# Patient Record
Sex: Female | Born: 1935 | Race: Black or African American | Hispanic: Refuse to answer | Smoking: Never smoker
Health system: Southern US, Community
[De-identification: ages and names within clinical notes are randomized; demographics above are authoritative.]

## PROBLEM LIST (undated history)

## (undated) DIAGNOSIS — I10 Essential (primary) hypertension: Secondary | ICD-10-CM

## (undated) DIAGNOSIS — F0280 Dementia in other diseases classified elsewhere without behavioral disturbance: Secondary | ICD-10-CM

## (undated) DIAGNOSIS — M1990 Unspecified osteoarthritis, unspecified site: Secondary | ICD-10-CM

## (undated) DIAGNOSIS — G309 Alzheimer's disease, unspecified: Secondary | ICD-10-CM

## (undated) DIAGNOSIS — D649 Anemia, unspecified: Secondary | ICD-10-CM

## (undated) DIAGNOSIS — T7840XA Allergy, unspecified, initial encounter: Secondary | ICD-10-CM

## (undated) HISTORY — DX: Allergy, unspecified, initial encounter: T78.40XA

## (undated) HISTORY — DX: Unspecified osteoarthritis, unspecified site: M19.90

## (undated) HISTORY — DX: Anemia, unspecified: D64.9

## (undated) HISTORY — DX: Alzheimer's disease, unspecified: G30.9

## (undated) HISTORY — DX: Dementia in other diseases classified elsewhere, unspecified severity, without behavioral disturbance, psychotic disturbance, mood disturbance, and anxiety: F02.80

## (undated) HISTORY — PX: ROTATOR CUFF REPAIR: SHX139

## (undated) HISTORY — PX: ABDOMINAL HYSTERECTOMY: SHX81

## (undated) HISTORY — DX: Essential (primary) hypertension: I10

---

## 2013-03-27 DIAGNOSIS — G3184 Mild cognitive impairment, so stated: Secondary | ICD-10-CM | POA: Diagnosis not present

## 2013-03-27 DIAGNOSIS — I10 Essential (primary) hypertension: Secondary | ICD-10-CM | POA: Diagnosis not present

## 2013-04-16 DIAGNOSIS — M48061 Spinal stenosis, lumbar region without neurogenic claudication: Secondary | ICD-10-CM | POA: Diagnosis not present

## 2013-04-16 DIAGNOSIS — G3184 Mild cognitive impairment, so stated: Secondary | ICD-10-CM | POA: Diagnosis not present

## 2013-04-16 DIAGNOSIS — I10 Essential (primary) hypertension: Secondary | ICD-10-CM | POA: Diagnosis not present

## 2013-04-18 DIAGNOSIS — I10 Essential (primary) hypertension: Secondary | ICD-10-CM | POA: Diagnosis not present

## 2013-04-18 DIAGNOSIS — M48061 Spinal stenosis, lumbar region without neurogenic claudication: Secondary | ICD-10-CM | POA: Diagnosis not present

## 2013-04-18 DIAGNOSIS — G3184 Mild cognitive impairment, so stated: Secondary | ICD-10-CM | POA: Diagnosis not present

## 2013-04-21 DIAGNOSIS — I10 Essential (primary) hypertension: Secondary | ICD-10-CM | POA: Diagnosis not present

## 2013-04-21 DIAGNOSIS — G3184 Mild cognitive impairment, so stated: Secondary | ICD-10-CM | POA: Diagnosis not present

## 2013-04-21 DIAGNOSIS — M48061 Spinal stenosis, lumbar region without neurogenic claudication: Secondary | ICD-10-CM | POA: Diagnosis not present

## 2013-04-22 DIAGNOSIS — I10 Essential (primary) hypertension: Secondary | ICD-10-CM | POA: Diagnosis not present

## 2013-04-22 DIAGNOSIS — M48061 Spinal stenosis, lumbar region without neurogenic claudication: Secondary | ICD-10-CM | POA: Diagnosis not present

## 2013-04-22 DIAGNOSIS — G3184 Mild cognitive impairment, so stated: Secondary | ICD-10-CM | POA: Diagnosis not present

## 2013-04-29 DIAGNOSIS — M48061 Spinal stenosis, lumbar region without neurogenic claudication: Secondary | ICD-10-CM | POA: Diagnosis not present

## 2013-04-29 DIAGNOSIS — G3184 Mild cognitive impairment, so stated: Secondary | ICD-10-CM | POA: Diagnosis not present

## 2013-04-29 DIAGNOSIS — I10 Essential (primary) hypertension: Secondary | ICD-10-CM | POA: Diagnosis not present

## 2013-04-30 DIAGNOSIS — G3184 Mild cognitive impairment, so stated: Secondary | ICD-10-CM | POA: Diagnosis not present

## 2013-04-30 DIAGNOSIS — I10 Essential (primary) hypertension: Secondary | ICD-10-CM | POA: Diagnosis not present

## 2013-04-30 DIAGNOSIS — M48061 Spinal stenosis, lumbar region without neurogenic claudication: Secondary | ICD-10-CM | POA: Diagnosis not present

## 2013-05-01 DIAGNOSIS — M48061 Spinal stenosis, lumbar region without neurogenic claudication: Secondary | ICD-10-CM | POA: Diagnosis not present

## 2013-05-01 DIAGNOSIS — G3184 Mild cognitive impairment, so stated: Secondary | ICD-10-CM | POA: Diagnosis not present

## 2013-05-01 DIAGNOSIS — I10 Essential (primary) hypertension: Secondary | ICD-10-CM | POA: Diagnosis not present

## 2013-05-06 DIAGNOSIS — G3184 Mild cognitive impairment, so stated: Secondary | ICD-10-CM | POA: Diagnosis not present

## 2013-05-06 DIAGNOSIS — M48061 Spinal stenosis, lumbar region without neurogenic claudication: Secondary | ICD-10-CM | POA: Diagnosis not present

## 2013-05-06 DIAGNOSIS — I10 Essential (primary) hypertension: Secondary | ICD-10-CM | POA: Diagnosis not present

## 2013-05-08 DIAGNOSIS — M48061 Spinal stenosis, lumbar region without neurogenic claudication: Secondary | ICD-10-CM | POA: Diagnosis not present

## 2013-05-08 DIAGNOSIS — I10 Essential (primary) hypertension: Secondary | ICD-10-CM | POA: Diagnosis not present

## 2013-05-08 DIAGNOSIS — G3184 Mild cognitive impairment, so stated: Secondary | ICD-10-CM | POA: Diagnosis not present

## 2013-05-12 DIAGNOSIS — M48061 Spinal stenosis, lumbar region without neurogenic claudication: Secondary | ICD-10-CM | POA: Diagnosis not present

## 2013-05-12 DIAGNOSIS — G3184 Mild cognitive impairment, so stated: Secondary | ICD-10-CM | POA: Diagnosis not present

## 2013-05-12 DIAGNOSIS — I10 Essential (primary) hypertension: Secondary | ICD-10-CM | POA: Diagnosis not present

## 2013-05-16 DIAGNOSIS — M48061 Spinal stenosis, lumbar region without neurogenic claudication: Secondary | ICD-10-CM | POA: Diagnosis not present

## 2013-05-16 DIAGNOSIS — I10 Essential (primary) hypertension: Secondary | ICD-10-CM | POA: Diagnosis not present

## 2013-05-16 DIAGNOSIS — G3184 Mild cognitive impairment, so stated: Secondary | ICD-10-CM | POA: Diagnosis not present

## 2013-05-20 DIAGNOSIS — G3184 Mild cognitive impairment, so stated: Secondary | ICD-10-CM | POA: Diagnosis not present

## 2013-05-20 DIAGNOSIS — M48061 Spinal stenosis, lumbar region without neurogenic claudication: Secondary | ICD-10-CM | POA: Diagnosis not present

## 2013-05-20 DIAGNOSIS — I10 Essential (primary) hypertension: Secondary | ICD-10-CM | POA: Diagnosis not present

## 2013-06-02 DIAGNOSIS — G3184 Mild cognitive impairment, so stated: Secondary | ICD-10-CM | POA: Diagnosis not present

## 2013-06-02 DIAGNOSIS — I10 Essential (primary) hypertension: Secondary | ICD-10-CM | POA: Diagnosis not present

## 2013-06-02 DIAGNOSIS — M48061 Spinal stenosis, lumbar region without neurogenic claudication: Secondary | ICD-10-CM | POA: Diagnosis not present

## 2013-06-04 DIAGNOSIS — M48061 Spinal stenosis, lumbar region without neurogenic claudication: Secondary | ICD-10-CM | POA: Diagnosis not present

## 2013-06-04 DIAGNOSIS — I10 Essential (primary) hypertension: Secondary | ICD-10-CM | POA: Diagnosis not present

## 2013-06-04 DIAGNOSIS — G3184 Mild cognitive impairment, so stated: Secondary | ICD-10-CM | POA: Diagnosis not present

## 2013-06-06 DIAGNOSIS — G3184 Mild cognitive impairment, so stated: Secondary | ICD-10-CM | POA: Diagnosis not present

## 2013-06-06 DIAGNOSIS — M48061 Spinal stenosis, lumbar region without neurogenic claudication: Secondary | ICD-10-CM | POA: Diagnosis not present

## 2013-06-06 DIAGNOSIS — I10 Essential (primary) hypertension: Secondary | ICD-10-CM | POA: Diagnosis not present

## 2013-06-09 DIAGNOSIS — M48061 Spinal stenosis, lumbar region without neurogenic claudication: Secondary | ICD-10-CM | POA: Diagnosis not present

## 2013-06-09 DIAGNOSIS — I10 Essential (primary) hypertension: Secondary | ICD-10-CM | POA: Diagnosis not present

## 2013-06-09 DIAGNOSIS — G3184 Mild cognitive impairment, so stated: Secondary | ICD-10-CM | POA: Diagnosis not present

## 2013-06-11 DIAGNOSIS — M48061 Spinal stenosis, lumbar region without neurogenic claudication: Secondary | ICD-10-CM | POA: Diagnosis not present

## 2013-06-11 DIAGNOSIS — I10 Essential (primary) hypertension: Secondary | ICD-10-CM | POA: Diagnosis not present

## 2013-06-11 DIAGNOSIS — G3184 Mild cognitive impairment, so stated: Secondary | ICD-10-CM | POA: Diagnosis not present

## 2013-06-13 DIAGNOSIS — M48061 Spinal stenosis, lumbar region without neurogenic claudication: Secondary | ICD-10-CM | POA: Diagnosis not present

## 2013-06-13 DIAGNOSIS — G3184 Mild cognitive impairment, so stated: Secondary | ICD-10-CM | POA: Diagnosis not present

## 2013-06-13 DIAGNOSIS — I10 Essential (primary) hypertension: Secondary | ICD-10-CM | POA: Diagnosis not present

## 2013-09-16 DIAGNOSIS — M25559 Pain in unspecified hip: Secondary | ICD-10-CM | POA: Diagnosis not present

## 2013-09-16 DIAGNOSIS — I10 Essential (primary) hypertension: Secondary | ICD-10-CM | POA: Diagnosis not present

## 2013-09-16 DIAGNOSIS — G3184 Mild cognitive impairment, so stated: Secondary | ICD-10-CM | POA: Diagnosis not present

## 2013-11-01 DIAGNOSIS — M2550 Pain in unspecified joint: Secondary | ICD-10-CM | POA: Diagnosis not present

## 2014-02-03 DIAGNOSIS — Z23 Encounter for immunization: Secondary | ICD-10-CM | POA: Diagnosis not present

## 2014-03-12 DIAGNOSIS — I10 Essential (primary) hypertension: Secondary | ICD-10-CM | POA: Diagnosis not present

## 2014-03-12 DIAGNOSIS — G3184 Mild cognitive impairment, so stated: Secondary | ICD-10-CM | POA: Diagnosis not present

## 2014-07-02 DIAGNOSIS — Z01411 Encounter for gynecological examination (general) (routine) with abnormal findings: Secondary | ICD-10-CM | POA: Diagnosis not present

## 2014-07-02 DIAGNOSIS — F329 Major depressive disorder, single episode, unspecified: Secondary | ICD-10-CM | POA: Diagnosis not present

## 2014-07-02 DIAGNOSIS — Z87891 Personal history of nicotine dependence: Secondary | ICD-10-CM | POA: Diagnosis not present

## 2014-07-02 DIAGNOSIS — G309 Alzheimer's disease, unspecified: Secondary | ICD-10-CM | POA: Diagnosis not present

## 2014-07-28 DIAGNOSIS — M8580 Other specified disorders of bone density and structure, unspecified site: Secondary | ICD-10-CM | POA: Diagnosis not present

## 2014-07-28 DIAGNOSIS — G3184 Mild cognitive impairment, so stated: Secondary | ICD-10-CM | POA: Diagnosis not present

## 2014-07-28 DIAGNOSIS — M7989 Other specified soft tissue disorders: Secondary | ICD-10-CM | POA: Diagnosis not present

## 2014-07-28 DIAGNOSIS — M899 Disorder of bone, unspecified: Secondary | ICD-10-CM | POA: Diagnosis not present

## 2014-07-28 DIAGNOSIS — I10 Essential (primary) hypertension: Secondary | ICD-10-CM | POA: Diagnosis not present

## 2014-07-29 DIAGNOSIS — D649 Anemia, unspecified: Secondary | ICD-10-CM | POA: Diagnosis not present

## 2014-10-06 DIAGNOSIS — I10 Essential (primary) hypertension: Secondary | ICD-10-CM | POA: Diagnosis not present

## 2014-10-06 DIAGNOSIS — G3184 Mild cognitive impairment, so stated: Secondary | ICD-10-CM | POA: Diagnosis not present

## 2014-10-16 ENCOUNTER — Ambulatory Visit (INDEPENDENT_AMBULATORY_CARE_PROVIDER_SITE_OTHER): Payer: Medicare Other | Admitting: Family Medicine

## 2014-10-16 ENCOUNTER — Ambulatory Visit (INDEPENDENT_AMBULATORY_CARE_PROVIDER_SITE_OTHER): Payer: Medicare Other

## 2014-10-16 VITALS — BP 130/72 | HR 76 | Temp 97.8°F | Resp 14 | Ht 63.0 in | Wt 155.2 lb

## 2014-10-16 DIAGNOSIS — M25552 Pain in left hip: Secondary | ICD-10-CM

## 2014-10-16 DIAGNOSIS — M25512 Pain in left shoulder: Secondary | ICD-10-CM

## 2014-10-16 DIAGNOSIS — Z7689 Persons encountering health services in other specified circumstances: Secondary | ICD-10-CM

## 2014-10-16 DIAGNOSIS — T148XXA Other injury of unspecified body region, initial encounter: Secondary | ICD-10-CM

## 2014-10-16 DIAGNOSIS — T148 Other injury of unspecified body region: Secondary | ICD-10-CM

## 2014-10-16 DIAGNOSIS — Z7189 Other specified counseling: Secondary | ICD-10-CM

## 2014-10-16 NOTE — Progress Notes (Signed)
 Chief Complaint:  Chief Complaint  Patient presents with  . Fall  . Shoulder Pain    left shoulder  . memory issues    HPI: Tara Dixon is a 81 y.o. female who reports to [facility] today complaining of left shoulder pain due to fall and gait instability, diagnosed with Alzheimer's which is in early stages. She also has OA and has been stumbling, this is not new for her. She uses a walker but still has some gait instability. Last night she fell while opening the refirdgerator and lost her balnace. Did not hit her head, no LOC. She has fallen twice, in the first time was last month, she fell down a couple of steps. She can't move her left arm very well above waist height. Denies n/w/t that is new. Deneis confusion, cp , dizziness, urianry sxs, sob or palpitations.   Past Medical History  Diagnosis Date  . Alzheimer disease   . Allergy   . Anemia   . Arthritis   . Hypertension    Past Surgical History  Procedure Laterality Date  . Abdominal hysterectomy    . Rotator cuff repair Right    Social History   Social History  . Marital Status: Widowed    Spouse Name: N/A  . Number of Children: N/A  . Years of Education: N/A   Social History Main Topics  . Smoking status: Never Smoker   . Smokeless tobacco: None  . Alcohol Use: None  . Drug Use: None  . Sexual Activity: Not Asked   Other Topics Concern  . None   Social History Narrative  . None   Family History  Problem Relation Age of Onset  . Hypertension Mother   . Stroke Mother   . Hypertension Father   . Stroke Father   . Hypertension Sister    Allergies  Allergen Reactions  . Ampicillin   . Bactrim [Sulfamethoxazole-Trimethoprim]   . Celebrex [Celecoxib]   . Polysporin [Bacitracin-Polymyxin B]    Prior to Admission medications   Medication Sig Start Date End Date Taking? Authorizing Provider  amLODipine (NORVASC) 5 MG tablet Take 5 mg by mouth daily.   Yes Historical Provider, MD  Calcium  Carb-Cholecalciferol (CALCIUM + D3 PO) Take by mouth.   Yes Historical Provider, MD  hydrochlorothiazide (MICROZIDE) 12.5 MG capsule Take 12.5 mg by mouth daily.   Yes Historical Provider, MD  MEMANTINE HCL PO Take by mouth.   Yes Historical Provider, MD  Multiple Vitamins-Minerals (CENTRUM ADULTS PO) Take by mouth.   Yes Historical Provider, MD  Omega-3 Fatty Acids (OMEGA 3 PO) Take by mouth.   Yes Historical Provider, MD     ROS: The patient denies fevers, chills, night sweats, unintentional weight loss, chest pain, palpitations, wheezing, dyspnea on exertion, nausea, vomiting, abdominal pain, dysuria, hematuria, melena, numbness, weakness, or tingling.   All other systems have been reviewed and were otherwise negative with the exception of those mentioned in the HPI and as above.    PHYSICAL EXAM: Filed Vitals:   10/16/14 1638  BP: 130/72  Pulse: 76  Temp: 97.8 F (36.6 C)  Resp: 14   Body mass index is 27.5 kg/(m^2).   General: Alert, no acute distress HEENT:  Normocephalic, atraumatic, oropharynx patent. EOMI, PERRLA Cardiovascular:  Regular rate and rhythm, no rubs murmurs or gallops.  No Carotid bruits, radial pulse intact. No pedal edema.  Respiratory: Clear to auscultation bilaterally.  No wheezes, rales, or rhonchi.  No cyanosis,  no use of accessory musculature Abdominal: No organomegaly, abdomen is soft and non-tender, positive bowel sounds. No masses. Skin: No rashes. Neurologic: Facial musculature symmetric. Psychiatric: Patient acts appropriately throughout our interaction. Lymphatic: No cervical or submandibular lymphadenopathy Musculoskeletal: Gait using walker. No edema, tenderness Neck exam normal-neg spurling Shoulder No deformity, no hypertrophy/atrophy, no erythema, no fluid, no wounds Decrease ROm, lift to flexion about 25 degrees, cannot do abbduction or adduction without pain. Good 5/5 grip strength Nontender at [facility] Unable to illicit Empty Can test,  Lift off test, Speeds, Hawkins/Neers due to pain 5/5 strength, 2/2 triceps and biceps DTRs ft hip-normal ER and IR       LABS: No results found for this or any previous visit.   EKG/XRAY:   Primary read interpreted by Dr. Marin Comment at [facility] Negative hip and shoulder, + DJD   ASSESSMENT/PLAN: Encounter Diagnoses  Name Primary?  . Hip pain, left Yes  . Pain in joint, shoulder region, left   . Sprain and strain    Refer to [facility] adult medicine Tylenol and ibuprofen Refer you to ortho   Gross sideeffects, risk and benefits, and alternatives of medications d/w patient. Patient is aware that all medications have potential sideeffects and we are unable to predict every sideeffect or drug-drug interaction that may occur.    DO  10/16/2014 6:09 PM

## 2014-11-20 ENCOUNTER — Ambulatory Visit: Payer: Medicare Other | Admitting: Internal Medicine

## 2014-12-14 DIAGNOSIS — M170 Bilateral primary osteoarthritis of knee: Secondary | ICD-10-CM | POA: Diagnosis not present

## 2015-01-12 DIAGNOSIS — G3184 Mild cognitive impairment, so stated: Secondary | ICD-10-CM | POA: Diagnosis not present

## 2015-01-12 DIAGNOSIS — D649 Anemia, unspecified: Secondary | ICD-10-CM | POA: Diagnosis not present

## 2015-01-12 DIAGNOSIS — Z23 Encounter for immunization: Secondary | ICD-10-CM | POA: Diagnosis not present

## 2015-01-18 DIAGNOSIS — Z880 Allergy status to penicillin: Secondary | ICD-10-CM | POA: Diagnosis not present

## 2015-01-18 DIAGNOSIS — G301 Alzheimer's disease with late onset: Secondary | ICD-10-CM | POA: Diagnosis not present

## 2015-01-18 DIAGNOSIS — Z882 Allergy status to sulfonamides status: Secondary | ICD-10-CM | POA: Diagnosis not present

## 2015-01-18 DIAGNOSIS — F0280 Dementia in other diseases classified elsewhere without behavioral disturbance: Secondary | ICD-10-CM | POA: Diagnosis not present

## 2015-01-18 DIAGNOSIS — G300 Alzheimer's disease with early onset: Secondary | ICD-10-CM | POA: Diagnosis not present

## 2015-01-20 ENCOUNTER — Ambulatory Visit: Payer: Self-pay | Admitting: Internal Medicine

## 2015-01-22 ENCOUNTER — Encounter: Payer: Self-pay | Admitting: Internal Medicine

## 2015-03-02 DIAGNOSIS — M1990 Unspecified osteoarthritis, unspecified site: Secondary | ICD-10-CM | POA: Diagnosis not present

## 2015-03-02 DIAGNOSIS — I10 Essential (primary) hypertension: Secondary | ICD-10-CM | POA: Diagnosis not present

## 2015-03-02 DIAGNOSIS — G301 Alzheimer's disease with late onset: Secondary | ICD-10-CM | POA: Diagnosis not present

## 2015-03-02 DIAGNOSIS — R600 Localized edema: Secondary | ICD-10-CM | POA: Diagnosis not present

## 2015-03-04 ENCOUNTER — Other Ambulatory Visit (HOSPITAL_COMMUNITY): Payer: Self-pay | Admitting: Internal Medicine

## 2015-03-04 DIAGNOSIS — R600 Localized edema: Secondary | ICD-10-CM

## 2015-03-11 ENCOUNTER — Ambulatory Visit (HOSPITAL_COMMUNITY): Payer: Medicare Other | Attending: Cardiovascular Disease

## 2015-03-11 ENCOUNTER — Other Ambulatory Visit: Payer: Self-pay

## 2015-03-11 DIAGNOSIS — I313 Pericardial effusion (noninflammatory): Secondary | ICD-10-CM | POA: Diagnosis not present

## 2015-03-11 DIAGNOSIS — I517 Cardiomegaly: Secondary | ICD-10-CM | POA: Diagnosis not present

## 2015-03-11 DIAGNOSIS — I253 Aneurysm of heart: Secondary | ICD-10-CM | POA: Insufficient documentation

## 2015-03-11 DIAGNOSIS — R600 Localized edema: Secondary | ICD-10-CM

## 2015-05-04 DIAGNOSIS — G301 Alzheimer's disease with late onset: Secondary | ICD-10-CM | POA: Diagnosis not present

## 2015-05-04 DIAGNOSIS — Z23 Encounter for immunization: Secondary | ICD-10-CM | POA: Diagnosis not present

## 2015-05-04 DIAGNOSIS — K5790 Diverticulosis of intestine, part unspecified, without perforation or abscess without bleeding: Secondary | ICD-10-CM | POA: Diagnosis not present

## 2015-05-04 DIAGNOSIS — E7800 Pure hypercholesterolemia, unspecified: Secondary | ICD-10-CM | POA: Diagnosis not present

## 2015-05-04 DIAGNOSIS — M1990 Unspecified osteoarthritis, unspecified site: Secondary | ICD-10-CM | POA: Diagnosis not present

## 2015-05-04 DIAGNOSIS — Z0000 Encounter for general adult medical examination without abnormal findings: Secondary | ICD-10-CM | POA: Diagnosis not present

## 2015-05-04 DIAGNOSIS — I10 Essential (primary) hypertension: Secondary | ICD-10-CM | POA: Diagnosis not present

## 2015-05-04 DIAGNOSIS — I5033 Acute on chronic diastolic (congestive) heart failure: Secondary | ICD-10-CM | POA: Diagnosis not present

## 2015-05-04 DIAGNOSIS — Z1389 Encounter for screening for other disorder: Secondary | ICD-10-CM | POA: Diagnosis not present

## 2015-05-04 DIAGNOSIS — K5900 Constipation, unspecified: Secondary | ICD-10-CM | POA: Diagnosis not present

## 2015-05-18 DIAGNOSIS — Z0100 Encounter for examination of eyes and vision without abnormal findings: Secondary | ICD-10-CM | POA: Diagnosis not present

## 2015-05-18 DIAGNOSIS — H2513 Age-related nuclear cataract, bilateral: Secondary | ICD-10-CM | POA: Diagnosis not present

## 2015-06-09 DIAGNOSIS — D649 Anemia, unspecified: Secondary | ICD-10-CM | POA: Diagnosis not present

## 2015-07-15 DIAGNOSIS — F0280 Dementia in other diseases classified elsewhere without behavioral disturbance: Secondary | ICD-10-CM | POA: Diagnosis not present

## 2015-07-15 DIAGNOSIS — G301 Alzheimer's disease with late onset: Secondary | ICD-10-CM | POA: Diagnosis not present

## 2015-09-07 DIAGNOSIS — G301 Alzheimer's disease with late onset: Secondary | ICD-10-CM | POA: Diagnosis not present

## 2015-09-07 DIAGNOSIS — I10 Essential (primary) hypertension: Secondary | ICD-10-CM | POA: Diagnosis not present

## 2015-09-07 DIAGNOSIS — D649 Anemia, unspecified: Secondary | ICD-10-CM | POA: Diagnosis not present

## 2015-09-07 DIAGNOSIS — M1990 Unspecified osteoarthritis, unspecified site: Secondary | ICD-10-CM | POA: Diagnosis not present

## 2015-09-07 DIAGNOSIS — I5033 Acute on chronic diastolic (congestive) heart failure: Secondary | ICD-10-CM | POA: Diagnosis not present

## 2015-09-07 DIAGNOSIS — Z23 Encounter for immunization: Secondary | ICD-10-CM | POA: Diagnosis not present

## 2015-11-10 DIAGNOSIS — B078 Other viral warts: Secondary | ICD-10-CM | POA: Diagnosis not present

## 2015-11-10 DIAGNOSIS — B079 Viral wart, unspecified: Secondary | ICD-10-CM | POA: Diagnosis not present

## 2015-11-10 DIAGNOSIS — L570 Actinic keratosis: Secondary | ICD-10-CM | POA: Diagnosis not present

## 2015-11-29 DIAGNOSIS — Z23 Encounter for immunization: Secondary | ICD-10-CM | POA: Diagnosis not present

## 2016-02-29 DIAGNOSIS — D649 Anemia, unspecified: Secondary | ICD-10-CM | POA: Diagnosis not present

## 2016-03-06 DIAGNOSIS — D509 Iron deficiency anemia, unspecified: Secondary | ICD-10-CM | POA: Diagnosis not present

## 2016-03-15 DIAGNOSIS — M1990 Unspecified osteoarthritis, unspecified site: Secondary | ICD-10-CM | POA: Diagnosis not present

## 2016-03-15 DIAGNOSIS — D563 Thalassemia minor: Secondary | ICD-10-CM | POA: Diagnosis not present

## 2016-03-15 DIAGNOSIS — D509 Iron deficiency anemia, unspecified: Secondary | ICD-10-CM | POA: Diagnosis not present

## 2016-03-15 DIAGNOSIS — G301 Alzheimer's disease with late onset: Secondary | ICD-10-CM | POA: Diagnosis not present

## 2016-03-15 DIAGNOSIS — I5033 Acute on chronic diastolic (congestive) heart failure: Secondary | ICD-10-CM | POA: Diagnosis not present

## 2016-03-15 DIAGNOSIS — I10 Essential (primary) hypertension: Secondary | ICD-10-CM | POA: Diagnosis not present

## 2016-03-15 DIAGNOSIS — E7800 Pure hypercholesterolemia, unspecified: Secondary | ICD-10-CM | POA: Diagnosis not present

## 2016-05-17 DIAGNOSIS — D2239 Melanocytic nevi of other parts of face: Secondary | ICD-10-CM | POA: Diagnosis not present

## 2016-05-17 DIAGNOSIS — L821 Other seborrheic keratosis: Secondary | ICD-10-CM | POA: Diagnosis not present

## 2016-07-05 DIAGNOSIS — G4710 Hypersomnia, unspecified: Secondary | ICD-10-CM | POA: Diagnosis not present

## 2016-07-05 DIAGNOSIS — I10 Essential (primary) hypertension: Secondary | ICD-10-CM | POA: Diagnosis not present

## 2016-07-05 DIAGNOSIS — G301 Alzheimer's disease with late onset: Secondary | ICD-10-CM | POA: Diagnosis not present

## 2016-07-05 DIAGNOSIS — D509 Iron deficiency anemia, unspecified: Secondary | ICD-10-CM | POA: Diagnosis not present

## 2016-07-22 IMAGING — CR DG HIP (WITH OR WITHOUT PELVIS) 2-3V*L*
3 series · 3 of 3 positions shown · non-contrast
Comparison: None.

CLINICAL DATA: LEFT hip pain, fall

EXAM:
DG HIP (WITH OR WITHOUT PELVIS) 2-3V LEFT

[AP (1 of 2)]
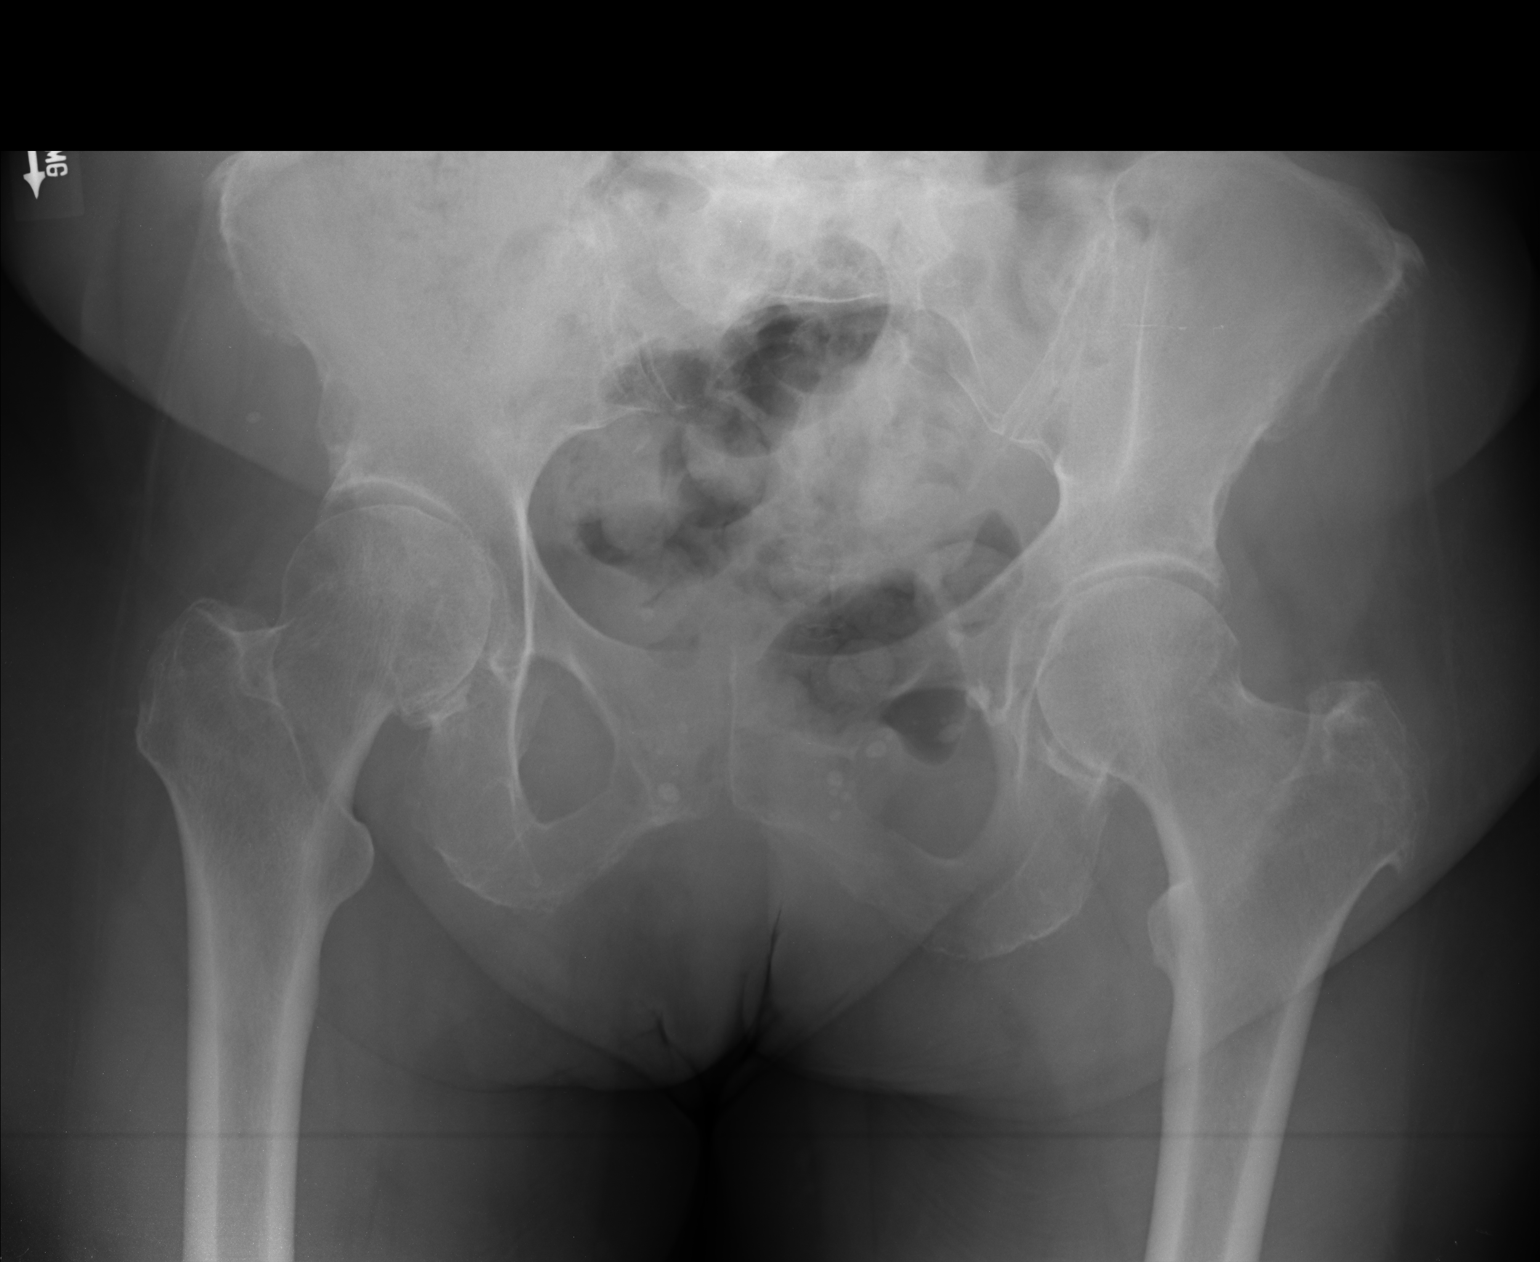

[AP (2 of 2)]
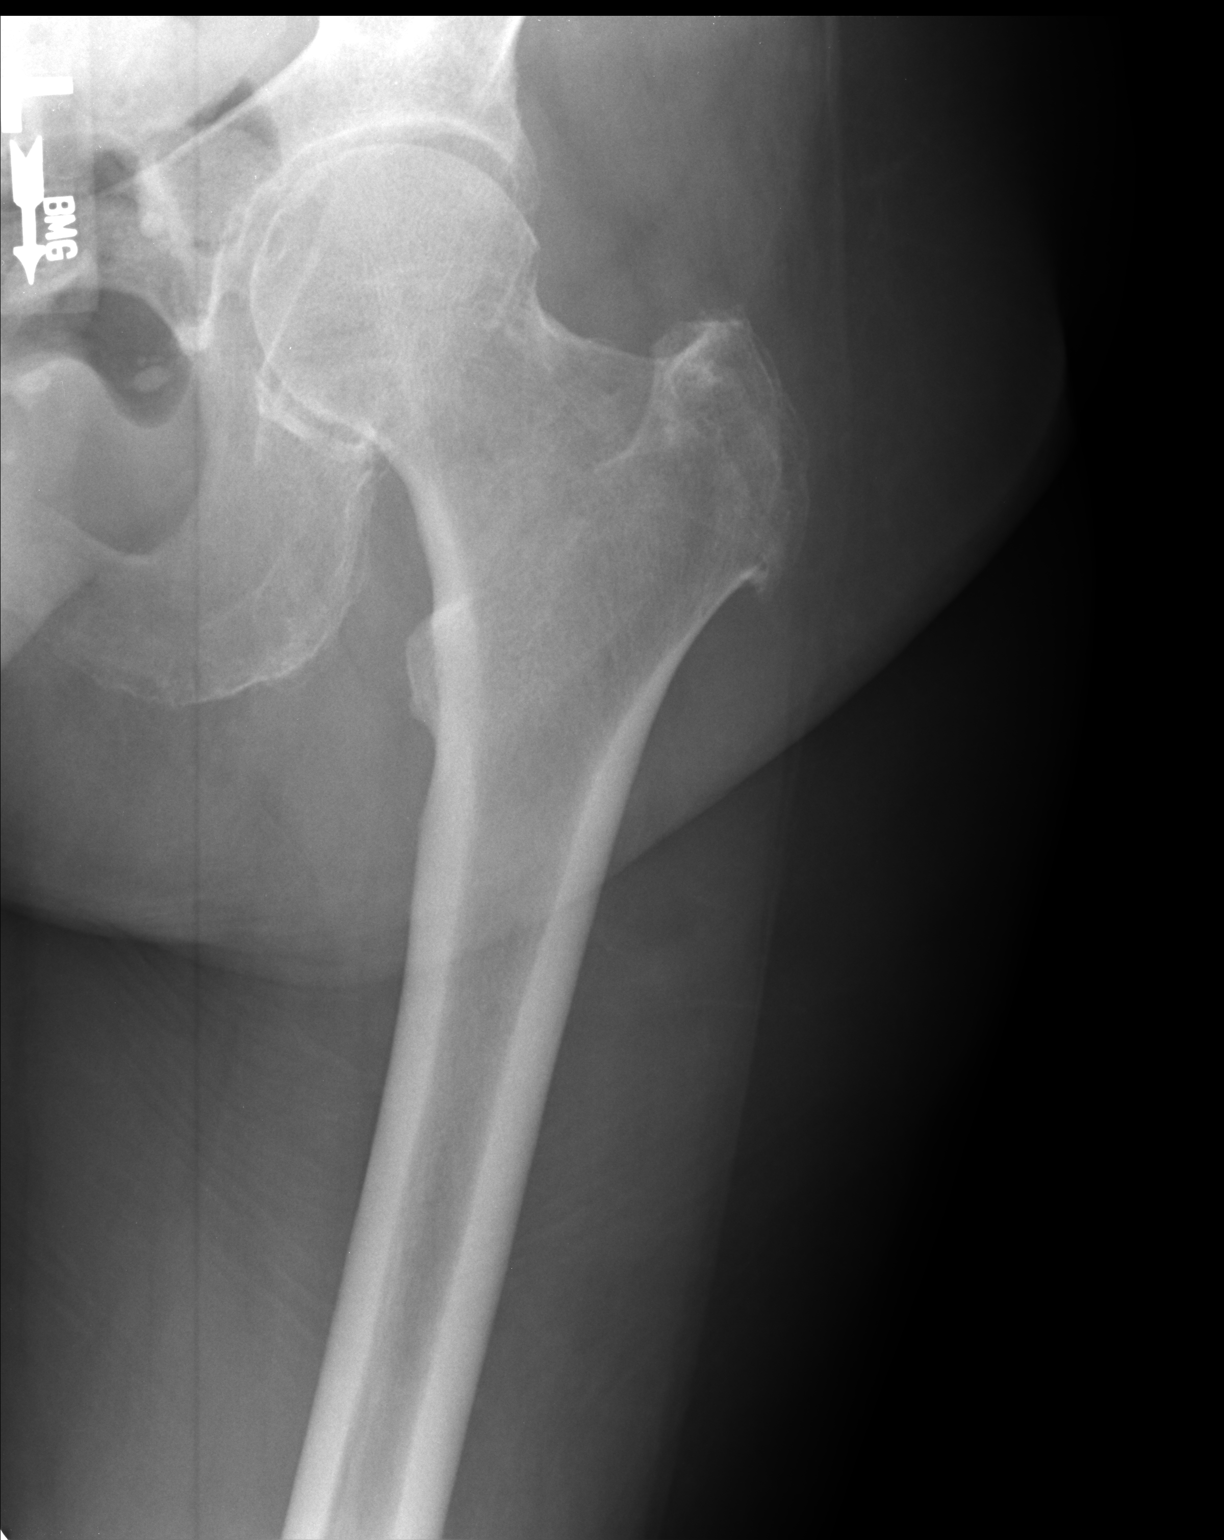

[lateral]
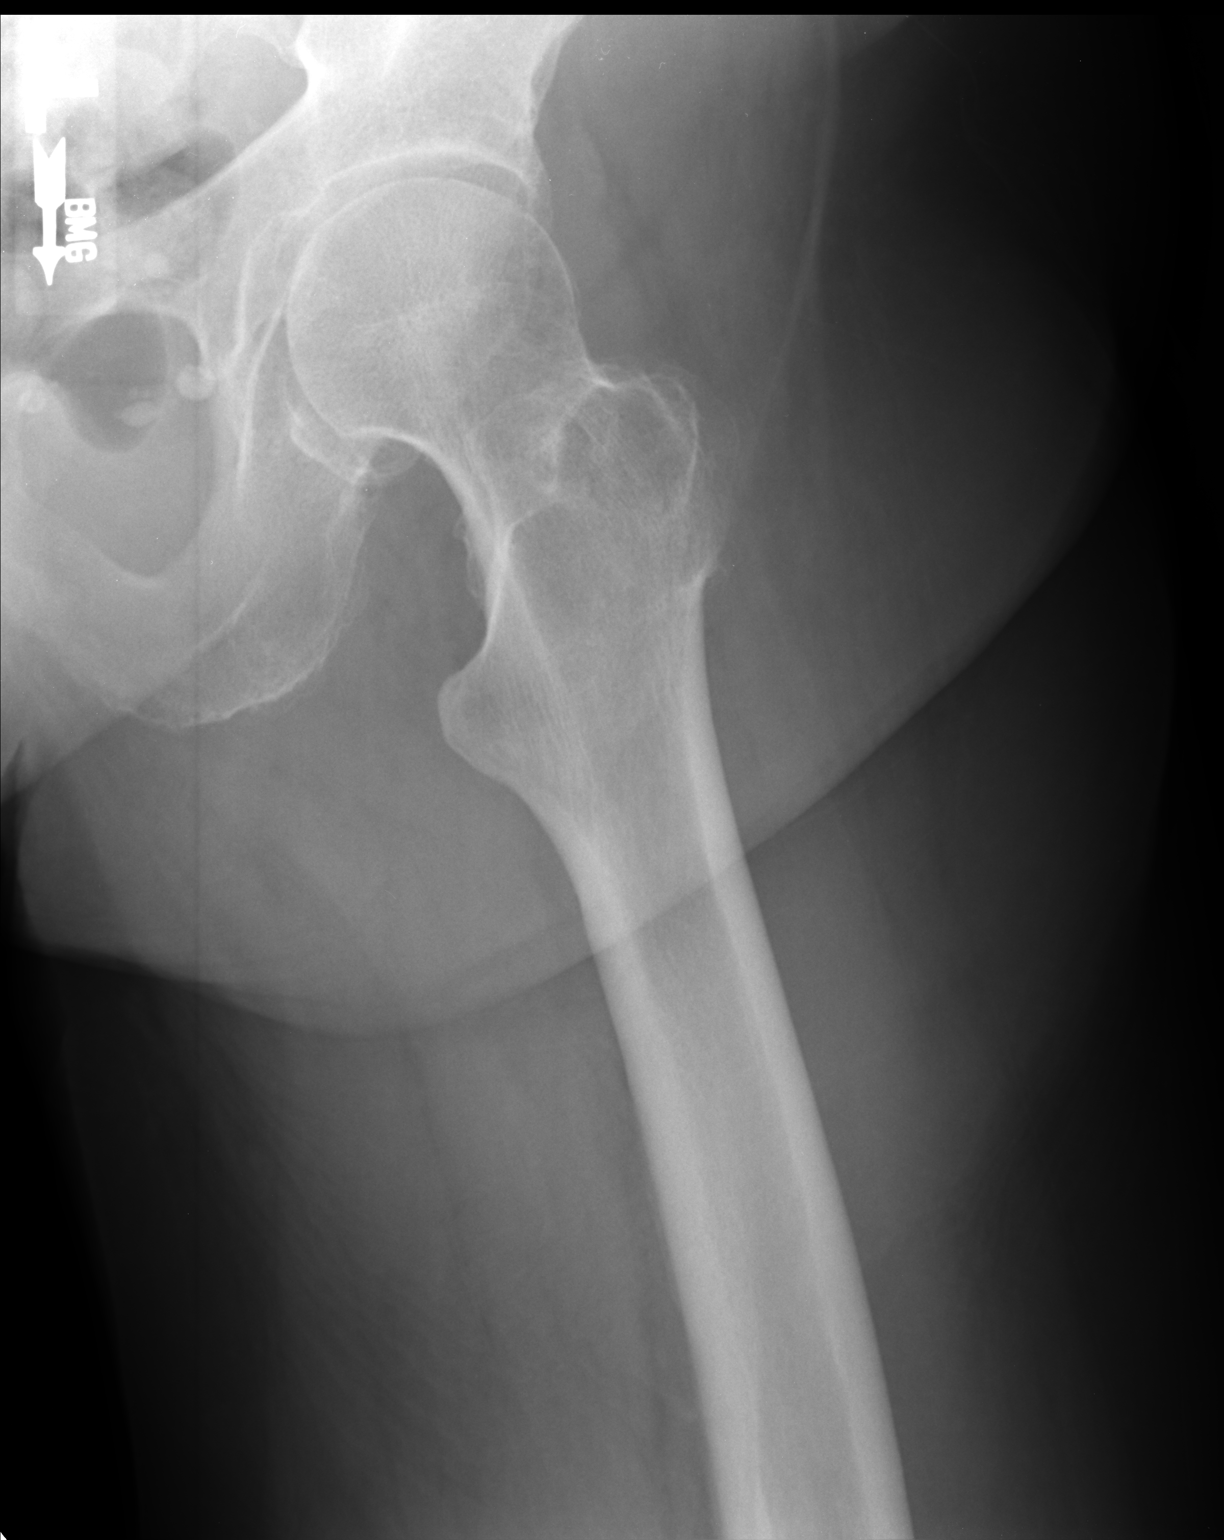

[3 of 3 positions shown; findings below may reference images not displayed]

FINDINGS: Diffuse osseous demineralization.

BILATERAL hip joint space narrowing.

SI joints preserved.

Degenerative disc and facet disease changes at visualized lower
lumbar spine.

No acute fracture, dislocation, or bone destruction.

Scattered pelvic phleboliths.
IMPRESSION: Osseous demineralization with degenerative changes of both hip
joints.

No acute abnormalities.

## 2016-08-09 ENCOUNTER — Ambulatory Visit: Payer: Medicare Other

## 2016-08-09 ENCOUNTER — Other Ambulatory Visit (HOSPITAL_BASED_OUTPATIENT_CLINIC_OR_DEPARTMENT_OTHER): Payer: Medicare Other

## 2016-08-09 ENCOUNTER — Ambulatory Visit (HOSPITAL_BASED_OUTPATIENT_CLINIC_OR_DEPARTMENT_OTHER): Payer: Medicare Other | Admitting: Hematology & Oncology

## 2016-08-09 VITALS — BP 152/85 | HR 76 | Temp 97.6°F | Resp 16 | Wt 171.0 lb

## 2016-08-09 DIAGNOSIS — D508 Other iron deficiency anemias: Secondary | ICD-10-CM

## 2016-08-09 DIAGNOSIS — D563 Thalassemia minor: Secondary | ICD-10-CM | POA: Diagnosis not present

## 2016-08-09 LAB — CMP (CANCER CENTER ONLY)
ALT(SGPT): 37 U/L (ref 10–47)
AST: 34 U/L (ref 11–38)
Albumin: 3.5 g/dL (ref 3.3–5.5)
Alkaline Phosphatase: 67 U/L (ref 26–84)
BILIRUBIN TOTAL: 0.9 mg/dL (ref 0.20–1.60)
BUN, Bld: 13 mg/dL (ref 7–22)
CALCIUM: 9.6 mg/dL (ref 8.0–10.3)
CHLORIDE: 105 meq/L (ref 98–108)
CO2: 31 meq/L (ref 18–33)
Creat: 0.8 mg/dl (ref 0.6–1.2)
GLUCOSE: 90 mg/dL (ref 73–118)
POTASSIUM: 4.1 meq/L (ref 3.3–4.7)
Sodium: 143 mEq/L (ref 128–145)
Total Protein: 7 g/dL (ref 6.4–8.1)

## 2016-08-09 LAB — CBC WITH DIFFERENTIAL (CANCER CENTER ONLY)
BASO#: 0 10*3/uL (ref 0.0–0.2)
BASO%: 0.2 % (ref 0.0–2.0)
EOS%: 3.4 % (ref 0.0–7.0)
Eosinophils Absolute: 0.1 10*3/uL (ref 0.0–0.5)
HEMATOCRIT: 33.7 % — AB (ref 34.8–46.6)
HGB: 11 g/dL — ABNORMAL LOW (ref 11.6–15.9)
LYMPH#: 1.5 10*3/uL (ref 0.9–3.3)
LYMPH%: 37 % (ref 14.0–48.0)
MCH: 24.1 pg — ABNORMAL LOW (ref 26.0–34.0)
MCHC: 32.6 g/dL (ref 32.0–36.0)
MCV: 74 fL — ABNORMAL LOW (ref 81–101)
MONO#: 0.6 10*3/uL (ref 0.1–0.9)
MONO%: 14.7 % — ABNORMAL HIGH (ref 0.0–13.0)
NEUT#: 1.9 10*3/uL (ref 1.5–6.5)
NEUT%: 44.7 % (ref 39.6–80.0)
Platelets: 149 10*3/uL (ref 145–400)
RBC: 4.56 10*6/uL (ref 3.70–5.32)
RDW: 16.2 % — AB (ref 11.1–15.7)
WBC: 4.2 10*3/uL (ref 3.9–10.0)

## 2016-08-09 LAB — CHCC SATELLITE - SMEAR

## 2016-08-09 MED ORDER — FOLIC ACID 1 MG PO TABS: 2.0000 mg | ORAL_TABLET | Freq: Every day | ORAL | 12 refills | Status: DC

## 2016-08-09 NOTE — Progress Notes (Signed)
Referral MD  Reason for Referral: Microcytic anemia; alpha thalassemia   Chief Complaint  Patient presents with  . New Patient (Initial Visit)  : I think my blood is low.  HPI: Tara Dixon is a very charming 83 year old African-American female. She does have some Alzheimer's.  She comes with her daughter who provides a lot of the history.  According to her daughter, she has been feeling more tired. She's been sleeping quite a bit.  She sees Dr. Deforest Hoyles. He does some lab work on her back in May. This showed a Weiser 4.7. Hemoglobin 11.4. Platelet count 121,000. MCV was 76.  Tara Dixon does have alpha thalassemia. Her daughter also has this. She is taking some over-the-counter vitamins.  She has had her thyroid checked and this has been normal.  Because of the anemia, it was felt that she needed to see a hematologist. She was kindly referred to the [place].  She probably has some colonic polyps. Her daughter is not sure when her last colonoscopy was.  She has had no obvious bleeding. There's been no ulna or bright red blood per rectum.  She is not a vegetarian. She's not lost weight.  There is no history of sickle cell in the family.  She used to smoke. She drank socially. She stopped both in 1973.  She's had no rashes. She's had no joint problems. There's been some arthritis.  She uses a rolling walker because of some balance problems.  Overall, her performance status is ECOG 3.   Past Medical History:  Diagnosis Date  . Allergy   . Alzheimer disease   . Anemia   . Arthritis   . Hypertension   :  Past Surgical History:  Procedure Laterality Date  . ABDOMINAL HYSTERECTOMY    . ROTATOR CUFF REPAIR Right   :   Current Outpatient Prescriptions:  .  memantine (NAMENDA XR) 28 MG CP24 24 hr capsule, Take 28 mg by mouth., Disp: , Rfl:  .  amLODipine (NORVASC) 5 MG tablet, Take 5 mg by mouth daily., Disp: , Rfl:  .  Calcium Carb-Cholecalciferol  (CALCIUM + D3 PO), Take by mouth., Disp: , Rfl:  .  hydrochlorothiazide (MICROZIDE) 12.5 MG capsule, Take 12.5 mg by mouth daily., Disp: , Rfl:  .  Multiple Vitamins-Minerals (CENTRUM ADULTS PO), Take by mouth., Disp: , Rfl:  .  Omega-3 Fatty Acids (OMEGA 3 PO), Take by mouth., Disp: , Rfl: :  :  Allergies  Allergen Reactions  . Ampicillin   . Bactrim [Sulfamethoxazole-Trimethoprim]   . Celebrex [Celecoxib]   . Polysporin [Bacitracin-Polymyxin B]   :  Family History  Problem Relation Age of Onset  . Hypertension Mother   . Stroke Mother   . Hypertension Father   . Stroke Father   . Hypertension Sister   :  Social History   Social History  . Marital status: Widowed    Spouse name: N/A  . Number of children: N/A  . Years of education: N/A   Occupational History  . Not on file.   Social History Main Topics  . Smoking status: Never Smoker  . Smokeless tobacco: Not on file  . Alcohol use Not on file  . Drug use: Unknown  . Sexual activity: Not on file   Other Topics Concern  . Not on file   Social History Narrative  . No narrative on file  :  Pertinent items are noted in HPI.  Exam:Elderly African-American female in no obvious  distress. Vital signs show a temperature of 97.6. Pulse is 76. Blood pressure 152/85. Weight is 171 pounds. Head and neck exam shows no ocular or oral lesions. There is no scleral icterus. Conjunctiva are pink. There is no adenopathy in her neck. Thyroid is not palpable. Lungs are clear bilaterally. Cardiac exam regular rate and rhythm with a 1/6 systolic ejection murmur. Abdomen is soft. Shows good bowel sounds. There is no fluid wave. There is no palpable liver or spleen tip. Back exam shows no tenderness over the spine, ribs or hips. Extremities shows no clubbing, or cyanosis. She has some chronic 1+ edema around her ankles. Neurological exam shows no focal neurological deficits. Skin exam shows no rashes, ecchymoses or petechia.    Recent  Labs  08/09/16 1507  WBC 4.2  HGB 11.0*  HCT 33.7*  PLT 149    Recent Labs  08/09/16 1507  NA 143  K 4.1  CL 105  CO2 31  GLUCOSE 90  BUN 13  CREATININE 0.8  CALCIUM 9.6    Blood smear review:  Slightly hypochromic red blood cells. She has some target cells. There are no nucleated red blood cells. I see no rouleau formation. She has no inclusion bodies. White blood cells been normal in morphology maturation. There is no immature myeloid or lymphoid forms. Platelets are adequate in number and size.  Pathology: None     Assessment and Plan:  This Spranger is a very charming 83 year old African-American female. She has Alzheimer's. She really cannot give much history. Thankfully, her daughter is very informative.  She really is not that anemic. I have a hard time believing that anemia is the source of her issues. It might be that this could just be progressive Alzheimer's.  However, I will go ahead and see what her iron studies show.  I will start her on folic acid. I think this is reasonable. I'll start her on 2 mg a day of folic acid.  I do not see anything that looks like a bone marrow disorder. At her age, myelodysplasia is always a possibility. However, no weight in no this would be to do a bone marrow test. I really told think this is necessary.  We will have to see what her iron studies show.  I spent about 45 minutes with Tara Dixon her daughter. I answered all of their questions. I reviewed her lab work with her.  I will like to see her back in about 6 weeks. By then, we should know how things are looking with her iron studies.

## 2016-08-10 LAB — RETICULOCYTES: Reticulocyte Count: 1.3 % (ref 0.6–2.6)

## 2016-08-10 LAB — IRON AND TIBC
%SAT: 31 % (ref 21–57)
Iron: 90 ug/dL (ref 41–142)
TIBC: 293 ug/dL (ref 236–444)
UIBC: 203 ug/dL (ref 120–384)

## 2016-08-10 LAB — FERRITIN: FERRITIN: 128 ng/mL (ref 9–269)

## 2016-08-10 LAB — ERYTHROPOIETIN: Erythropoietin: 15.3 m[IU]/mL (ref 2.6–18.5)

## 2016-08-10 LAB — LACTATE DEHYDROGENASE: LDH: 217 U/L (ref 125–245)

## 2016-10-31 DIAGNOSIS — M1990 Unspecified osteoarthritis, unspecified site: Secondary | ICD-10-CM | POA: Diagnosis not present

## 2016-10-31 DIAGNOSIS — I5033 Acute on chronic diastolic (congestive) heart failure: Secondary | ICD-10-CM | POA: Diagnosis not present

## 2016-10-31 DIAGNOSIS — Z23 Encounter for immunization: Secondary | ICD-10-CM | POA: Diagnosis not present

## 2016-10-31 DIAGNOSIS — I10 Essential (primary) hypertension: Secondary | ICD-10-CM | POA: Diagnosis not present

## 2016-10-31 DIAGNOSIS — D649 Anemia, unspecified: Secondary | ICD-10-CM | POA: Diagnosis not present

## 2016-10-31 DIAGNOSIS — R600 Localized edema: Secondary | ICD-10-CM | POA: Diagnosis not present

## 2016-10-31 DIAGNOSIS — G301 Alzheimer's disease with late onset: Secondary | ICD-10-CM | POA: Diagnosis not present

## 2017-01-18 ENCOUNTER — Encounter: Payer: Self-pay | Admitting: Podiatry

## 2017-01-18 ENCOUNTER — Ambulatory Visit (INDEPENDENT_AMBULATORY_CARE_PROVIDER_SITE_OTHER): Payer: Medicare Other | Admitting: Podiatry

## 2017-01-18 VITALS — BP 136/84 | HR 84

## 2017-01-18 DIAGNOSIS — B351 Tinea unguium: Secondary | ICD-10-CM

## 2017-01-18 DIAGNOSIS — M79672 Pain in left foot: Secondary | ICD-10-CM | POA: Diagnosis not present

## 2017-01-18 DIAGNOSIS — M79671 Pain in right foot: Secondary | ICD-10-CM | POA: Diagnosis not present

## 2017-01-18 NOTE — Patient Instructions (Addendum)
Seen for hypertrophic nails. All nails debrided. May benefit from compression socks for swollen foot. Return in 3 months or as needed.

## 2017-01-18 NOTE — Progress Notes (Signed)
SUBJECTIVE: 83 y.o. year old female presents requesting her toe nails trimmed. She was accompanied by her daughter.  Difficulty walking with poor balance. Uses cane.  Positive for Alzheimers and osteoarthritis in knees and legs. and HTN. Review of Systems  Constitutional: Negative.   HENT: Negative.   Eyes: Negative.   Respiratory: Negative.   Cardiovascular: Negative.   Gastrointestinal: Negative.   Musculoskeletal: Positive for back pain and joint pain.  Skin: Negative.   Psychiatric/Behavioral:       Diagnosed with Alzheimer's     OBJECTIVE: DERMATOLOGIC EXAMINATION: Thick dystrophic nails x 10.  VASCULAR EXAMINATION OF LOWER LIMBS: Dorsalis Pedis artery: Palpable on both feet. Posterior Tibial artery: Not palpable on both feet. Capillary Filling times within 3 seconds in all digits.  Postive for bilateral pedal edema. Temperature gradient from tibial crest to dorsum of foot is within normal bilateral.  NEUROLOGIC EXAMINATION OF THE LOWER LIMBS: Achilles DTR is present and within normal. Monofilament (Semmes-Weinstein 10-gm) sensory testing positive 6 out of 6, bilateral. Vibratory sensations(128Hz  turning fork) intact at medial and lateral forefoot bilateral.  Sharp and Dull discriminatory sensations at the plantar ball of hallux is intact bilateral.   MUSCULOSKELETAL EXAMINATION: Positive for hallux valgus with bunion deformity bilateral.   ASSESSMENT: Onychomycosis x 10.  PLAN: Reviewed findings and available treatment options. All nails debrided. May return in 3 months for routine foot care.

## 2017-03-15 DIAGNOSIS — M1990 Unspecified osteoarthritis, unspecified site: Secondary | ICD-10-CM | POA: Diagnosis not present

## 2017-03-15 DIAGNOSIS — M8580 Other specified disorders of bone density and structure, unspecified site: Secondary | ICD-10-CM | POA: Diagnosis not present

## 2017-03-15 DIAGNOSIS — D563 Thalassemia minor: Secondary | ICD-10-CM | POA: Diagnosis not present

## 2017-03-15 DIAGNOSIS — G301 Alzheimer's disease with late onset: Secondary | ICD-10-CM | POA: Diagnosis not present

## 2017-03-15 DIAGNOSIS — R7309 Other abnormal glucose: Secondary | ICD-10-CM | POA: Diagnosis not present

## 2017-03-15 DIAGNOSIS — I10 Essential (primary) hypertension: Secondary | ICD-10-CM | POA: Diagnosis not present

## 2017-03-15 DIAGNOSIS — E7800 Pure hypercholesterolemia, unspecified: Secondary | ICD-10-CM | POA: Diagnosis not present

## 2017-04-19 ENCOUNTER — Ambulatory Visit: Payer: Self-pay | Admitting: Podiatry

## 2017-04-25 ENCOUNTER — Encounter: Payer: Self-pay | Admitting: Podiatry

## 2017-04-25 ENCOUNTER — Ambulatory Visit (INDEPENDENT_AMBULATORY_CARE_PROVIDER_SITE_OTHER): Payer: Medicare Other | Admitting: Podiatry

## 2017-04-25 DIAGNOSIS — B351 Tinea unguium: Secondary | ICD-10-CM

## 2017-04-25 DIAGNOSIS — M79672 Pain in left foot: Secondary | ICD-10-CM

## 2017-04-25 DIAGNOSIS — M79671 Pain in right foot: Secondary | ICD-10-CM

## 2017-04-25 NOTE — Patient Instructions (Signed)
Seen for hypertrophic nails. All nails debrided. Return in 3 months or as needed.  

## 2017-04-25 NOTE — Progress Notes (Signed)
Subjective: 84 y.o. year old female patient presents accompanied by her daughter complaining of painful nails. Patient requests toe nails trimmed. Patient walks with wheeled walker.  Positive for Alzheimer's and Osteoarthritis knees and legs.  Objective: Dermatologic: Thick yellow deformed nails x 10.  Vascular: Pedal pulses are all palpable. Positive of pedal edema bilateral. Orthopedic: No gross deformities.  Neurologic: All epicritic and tactile sensations grossly intact.  Assessment: Dystrophic mycotic nails x 10. Painful nails.  Treatment: All mycotic nails debrided.  Return in 3 months or as needed.

## 2017-07-03 ENCOUNTER — Encounter (HOSPITAL_BASED_OUTPATIENT_CLINIC_OR_DEPARTMENT_OTHER): Payer: Self-pay | Admitting: *Deleted

## 2017-07-03 ENCOUNTER — Emergency Department (HOSPITAL_BASED_OUTPATIENT_CLINIC_OR_DEPARTMENT_OTHER): Payer: Medicare Other

## 2017-07-03 ENCOUNTER — Other Ambulatory Visit: Payer: Self-pay

## 2017-07-03 ENCOUNTER — Emergency Department (HOSPITAL_BASED_OUTPATIENT_CLINIC_OR_DEPARTMENT_OTHER)
Admission: EM | Admit: 2017-07-03 | Discharge: 2017-07-03 | Disposition: A | Discharge status: Home / Self Care | Payer: Medicare Other | Attending: Emergency Medicine | Admitting: Emergency Medicine

## 2017-07-03 DIAGNOSIS — W19XXXA Unspecified fall, initial encounter: Secondary | ICD-10-CM

## 2017-07-03 DIAGNOSIS — Z9181 History of falling: Secondary | ICD-10-CM | POA: Diagnosis not present

## 2017-07-03 DIAGNOSIS — N390 Urinary tract infection, site not specified: Secondary | ICD-10-CM | POA: Diagnosis not present

## 2017-07-03 DIAGNOSIS — S299XXA Unspecified injury of thorax, initial encounter: Secondary | ICD-10-CM | POA: Diagnosis not present

## 2017-07-03 DIAGNOSIS — Z79899 Other long term (current) drug therapy: Secondary | ICD-10-CM | POA: Insufficient documentation

## 2017-07-03 DIAGNOSIS — G309 Alzheimer's disease, unspecified: Secondary | ICD-10-CM | POA: Insufficient documentation

## 2017-07-03 DIAGNOSIS — F0390 Unspecified dementia without behavioral disturbance: Secondary | ICD-10-CM | POA: Diagnosis not present

## 2017-07-03 DIAGNOSIS — I10 Essential (primary) hypertension: Secondary | ICD-10-CM | POA: Insufficient documentation

## 2017-07-03 DIAGNOSIS — R0602 Shortness of breath: Secondary | ICD-10-CM | POA: Diagnosis not present

## 2017-07-03 DIAGNOSIS — M79604 Pain in right leg: Secondary | ICD-10-CM | POA: Diagnosis present

## 2017-07-03 LAB — COMPREHENSIVE METABOLIC PANEL
ALK PHOS: 71 U/L (ref 38–126)
ALT: 27 U/L (ref 14–54)
AST: 32 U/L (ref 15–41)
Albumin: 3.9 g/dL (ref 3.5–5.0)
Anion gap: 10 (ref 5–15)
BILIRUBIN TOTAL: 0.6 mg/dL (ref 0.3–1.2)
BUN: 18 mg/dL (ref 6–20)
CALCIUM: 9.5 mg/dL (ref 8.9–10.3)
CO2: 28 mmol/L (ref 22–32)
CREATININE: 0.97 mg/dL (ref 0.44–1.00)
Chloride: 103 mmol/L (ref 101–111)
GFR calc Af Amer: >60 mL/min (ref >60)
GFR, EST NON AFRICAN AMERICAN: 53 mL/min — AB (ref >60)
Glucose, Bld: 138 mg/dL — ABNORMAL HIGH (ref 65–99)
POTASSIUM: 3.5 mmol/L (ref 3.5–5.1)
Sodium: 141 mmol/L (ref 135–145)
TOTAL PROTEIN: 7.3 g/dL (ref 6.5–8.1)

## 2017-07-03 LAB — URINALYSIS, MICROSCOPIC (REFLEX)

## 2017-07-03 LAB — DIFFERENTIAL
BASOS PCT: 0 %
Basophils Absolute: 0 10*3/uL (ref 0.0–0.1)
EOS ABS: 0.1 10*3/uL (ref 0.0–0.7)
EOS PCT: 2 %
LYMPHS PCT: 20 %
Lymphs Abs: 1.3 10*3/uL (ref 0.7–4.0)
Monocytes Absolute: 0.8 10*3/uL (ref 0.1–1.0)
Monocytes Relative: 12 %
NEUTROS PCT: 66 %
Neutro Abs: 4.3 10*3/uL (ref 1.7–7.7)

## 2017-07-03 LAB — URINALYSIS, ROUTINE W REFLEX MICROSCOPIC
Glucose, UA: NEGATIVE mg/dL
Ketones, ur: 15 mg/dL — AB
Nitrite: NEGATIVE
Protein, ur: 30 mg/dL — AB
pH: 5.5 (ref 5.0–8.0)

## 2017-07-03 LAB — BRAIN NATRIURETIC PEPTIDE: B Natriuretic Peptide: 29.3 pg/mL (ref 0.0–100.0)

## 2017-07-03 LAB — CBC
HEMATOCRIT: 34.4 % — AB (ref 36.0–46.0)
Hemoglobin: 11.6 g/dL — ABNORMAL LOW (ref 12.0–15.0)
MCH: 24.3 pg — ABNORMAL LOW (ref 26.0–34.0)
MCHC: 33.7 g/dL (ref 30.0–36.0)
MCV: 72 fL — AB (ref 78.0–100.0)
Platelets: 139 10*3/uL — ABNORMAL LOW (ref 150–400)
RBC: 4.78 MIL/uL (ref 3.87–5.11)
RDW: 16.3 % — AB (ref 11.5–15.5)
WBC: 6.5 10*3/uL (ref 4.0–10.5)

## 2017-07-03 LAB — TROPONIN I: Troponin I: <0.03 ng/mL (ref <0.03)

## 2017-07-03 MED ORDER — CIPROFLOXACIN HCL 500 MG PO TABS: 500.0000 mg | ORAL_TABLET | Freq: Once | ORAL | Status: DC

## 2017-07-03 MED ORDER — CIPROFLOXACIN HCL 500 MG PO TABS
250.0000 mg | ORAL_TABLET | Freq: Once | ORAL | Status: AC
  Administered 2017-07-03: 250 mg via ORAL
  Filled 2017-07-03: qty 1

## 2017-07-03 MED ORDER — CIPROFLOXACIN HCL 250 MG PO TABS: 250.0000 mg | ORAL_TABLET | Freq: Two times a day (BID) | ORAL | 0 refills | Status: DC

## 2017-07-03 NOTE — ED Notes (Signed)
Ambulated pt in hallway with walker w/o difficulty , daughter at bedside

## 2017-07-03 NOTE — ED Notes (Signed)
Attempted to obtain urine sample. Patient unable to provide sample at this time.

## 2017-07-03 NOTE — ED Notes (Signed)
ED Provider at bedside. 

## 2017-07-03 NOTE — ED Triage Notes (Signed)
Daughter states increased fall x 2 weeks, pt c/o bil knee pain

## 2017-07-03 NOTE — ED Notes (Signed)
Patient transported to X-ray 

## 2017-07-03 NOTE — ED Provider Notes (Signed)
[facility] EMERGENCY DEPARTMENT Provider Note   CSN: [id number] Arrival date & time: 07/03/17  1500     History   Chief Complaint Chief Complaint  Patient presents with  . Fall    HPI Tara Dixon is a 84 y.o. female.  HPI Patient with history of Alzheimer's.  Unable to contribute history.  Daughter at bedside.  States she has had multiple falls over the last 4 days.  2 unwitnessed falls today.  Unable to ambulate more than a few steps without assistance after falls.  Patient complaining of right-sided leg pain.  Daughter states patient appears to be getting increasingly short of breath especially with exertion.  She is denying known head injury, neck pain, chest pain, abdominal pain, vomiting or diarrhea. Past Medical History:  Diagnosis Date  . Allergy   . Alzheimer disease   . Anemia   . Arthritis   . Hypertension     There are no active problems to display for this patient.   Past Surgical History:  Procedure Laterality Date  . ABDOMINAL HYSTERECTOMY    . ROTATOR CUFF REPAIR Right      OB History   None      Home Medications    Prior to Admission medications   Medication Sig Start Date End Date Taking? Authorizing Provider  amLODipine (NORVASC) 5 MG tablet Take 5 mg by mouth daily.    [provider]  Calcium Carb-Cholecalciferol (CALCIUM + D3 PO) Take by mouth.    [provider]  ciprofloxacin (CIPRO) 250 MG tablet Take 1 tablet (250 mg total) by mouth every 12 (twelve) hours. 07/04/17   Julianne Rice, MD  folic acid (FOLVITE) 1 MG tablet Take 2 tablets (2 mg total) by mouth daily. 08/09/16   Volanda Napoleon, MD  hydrochlorothiazide (MICROZIDE) 12.5 MG capsule Take 12.5 mg by mouth daily.    [provider]  memantine (NAMENDA XR) 28 MG CP24 24 hr capsule Take 28 mg by mouth. 06/28/15   [provider]  Multiple Vitamins-Minerals (CENTRUM ADULTS PO) Take by mouth.    [provider]  Omega-3 Fatty  Acids (OMEGA 3 PO) Take by mouth.    [provider]    Family History Family History  Problem Relation Age of Onset  . Hypertension Mother   . Stroke Mother   . Hypertension Father   . Stroke Father   . Hypertension Sister     Social History Social History   Tobacco Use  . Smoking status: Never Smoker  . Smokeless tobacco: Never Used  Substance Use Topics  . Alcohol use: Not on file  . Drug use: Not on file     Allergies   Ampicillin; Bactrim [sulfamethoxazole-trimethoprim]; Celebrex [celecoxib]; and Polysporin [bacitracin-polymyxin b]   Review of Systems Review of Systems  Unable to perform ROS: Dementia     Physical Exam Updated Vital Signs BP 118/75   Pulse 71   Temp 98.3 F (36.8 C)   Resp 14   Ht 5\' 3"  (1.6 m)   Wt 77.6 kg (171 lb)   SpO2 97%   BMI 30.29 kg/m   Physical Exam  Constitutional: She is oriented to person, place, and time. She appears well-developed and well-nourished. No distress.  HENT:  Head: Normocephalic and atraumatic.  Mouth/Throat: Oropharynx is clear and moist.  No obvious head trauma.  Midface is stable.  No intraoral trauma.  Eyes: Pupils are equal, round, and reactive to light. EOM are normal.  Neck:  Normal range of motion. Neck supple.  No posterior midline cervical tenderness to palpation.  Cardiovascular: Normal rate and regular rhythm. Exam reveals no gallop and no friction rub.  No murmur heard. Pulmonary/Chest: Effort normal. She has rales.  Patient has crackles in bilateral bases.  Abdominal: Soft. Bowel sounds are normal. There is no tenderness. There is no rebound and no guarding.  Musculoskeletal: Normal range of motion. She exhibits no edema or tenderness.  Full range of motion of bilateral hips, knees and ankles without obvious effusion, deformity, erythema or tenderness.  Patient has mild right calf tenderness to palpation.  No obvious injury.  Distal pulses are 2+.  No midline thoracic or lumbar  tenderness.  Pelvis is stable.  Neurological: She is alert and oriented to person, place, and time.  Moves all extremities without focal deficit.  Sensation intact.  Patient answers questions and follows simple commands.  Oriented to self.  Skin: Skin is warm and dry. Capillary refill takes less than 2 seconds. No rash noted. She is not diaphoretic. No erythema.  Psychiatric: She has a normal mood and affect. Her behavior is normal.  Nursing note and vitals reviewed.    ED Treatments / Results  Labs (all labs ordered are listed, but only abnormal results are displayed) Labs Reviewed  URINALYSIS, ROUTINE W REFLEX MICROSCOPIC - Abnormal; Notable for the following components:      Result Value   APPearance CLOUDY (*)    Specific Gravity, Urine >1.030 (*)    Hgb urine dipstick TRACE (*)    Bilirubin Urine SMALL (*)    Ketones, ur 15 (*)    Protein, ur 30 (*)    Leukocytes, UA SMALL (*)    All other components within normal limits  CBC - Abnormal; Notable for the following components:   Hemoglobin 11.6 (*)    HCT 34.4 (*)    MCV 72.0 (*)    MCH 24.3 (*)    RDW 16.3 (*)    Platelets 139 (*)    All other components within normal limits  COMPREHENSIVE METABOLIC PANEL - Abnormal; Notable for the following components:   Glucose, Bld 138 (*)    GFR calc non Af Amer 53 (*)    All other components within normal limits  URINALYSIS, MICROSCOPIC (REFLEX) - Abnormal; Notable for the following components:   Bacteria, UA MANY (*)    All other components within normal limits  URINE CULTURE  TROPONIN I  BRAIN NATRIURETIC PEPTIDE  DIFFERENTIAL  CBC WITH DIFFERENTIAL/PLATELET    EKG EKG Interpretation  Date/Time:  Tuesday Jul 03 2017 16:27:04 EDT Ventricular Rate:  81 PR Interval:    QRS Duration: 90 QT Interval:  556 QTC Calculation: 646 R Axis:   -23 Text Interpretation:  Sinus rhythm Borderline left axis deviation Nonspecific T abnormalities, lateral leads Prolonged QT interval  Baseline wander in lead(s) V6 Confirmed by Virgel Manifold [phone]) on 07/04/2017 10:55:09 AM   Radiology Dg Chest 2 View  Result Date: 07/03/2017 CLINICAL DATA:  Recurrent falls over the past 2 weeks.  Confusion. EXAM: CHEST - 2 VIEW COMPARISON:  None. FINDINGS: The lungs are clear. There is cardiomegaly. No pneumothorax or pleural effusion. No acute bony abnormality. Thoracic spondylosis noted. Degenerative change about the shoulders is worse on the right. IMPRESSION: Cardiomegaly without acute disease. Electronically Signed   By: Inge Rise M.D.   On: 07/03/2017 17:33    Procedures Procedures (including critical care time)  Medications Ordered in ED Medications  ciprofloxacin (CIPRO)  tablet 250 mg (250 mg Oral Given 07/03/17 1857)     Initial Impression / Assessment and Plan / ED Course  I have reviewed the triage vital signs and the nursing notes.  Pertinent labs & imaging results that were available during my care of the patient were reviewed by me and considered in my medical decision making (see chart for details).    Evidence of UTI.  Urine sent for culture will start on antibiotics.  Patient is ambulating with walker.  Will discharge home with her daughter and have advised close follow-up with primary provider.  Return precautions given.   Final Clinical Impressions(s) / ED Diagnoses   Final diagnoses:  Fall, initial encounter  Acute lower UTI    ED Discharge Orders        Ordered    ciprofloxacin (CIPRO) 250 MG tablet  Every 12 hours     07/03/17 1915       Julianne Rice, MD 07/05/17 [phone]

## 2017-07-03 NOTE — ED Notes (Signed)
Pt's daughter verbalizes understanding of dc instructions and denies any further needs at this time

## 2017-07-03 NOTE — ED Notes (Signed)
Pt returned from xray

## 2017-07-03 NOTE — ED Notes (Signed)
Daughter states that pt has had three falls in the last week.  Pt denies any cause for her falls, no change to health status.

## 2017-07-04 ENCOUNTER — Telehealth (HOSPITAL_BASED_OUTPATIENT_CLINIC_OR_DEPARTMENT_OTHER): Payer: Self-pay | Admitting: Emergency Medicine

## 2017-07-05 LAB — URINE CULTURE

## 2017-07-10 DIAGNOSIS — R269 Unspecified abnormalities of gait and mobility: Secondary | ICD-10-CM | POA: Diagnosis not present

## 2017-07-10 DIAGNOSIS — I10 Essential (primary) hypertension: Secondary | ICD-10-CM | POA: Diagnosis not present

## 2017-07-10 DIAGNOSIS — R296 Repeated falls: Secondary | ICD-10-CM | POA: Diagnosis not present

## 2017-07-10 DIAGNOSIS — M179 Osteoarthritis of knee, unspecified: Secondary | ICD-10-CM | POA: Diagnosis not present

## 2017-07-10 DIAGNOSIS — R410 Disorientation, unspecified: Secondary | ICD-10-CM | POA: Diagnosis not present

## 2017-07-10 DIAGNOSIS — Z1389 Encounter for screening for other disorder: Secondary | ICD-10-CM | POA: Diagnosis not present

## 2017-07-10 DIAGNOSIS — G301 Alzheimer's disease with late onset: Secondary | ICD-10-CM | POA: Diagnosis not present

## 2017-07-11 DIAGNOSIS — F0280 Dementia in other diseases classified elsewhere without behavioral disturbance: Secondary | ICD-10-CM | POA: Diagnosis not present

## 2017-07-11 DIAGNOSIS — R410 Disorientation, unspecified: Secondary | ICD-10-CM | POA: Diagnosis not present

## 2017-07-11 DIAGNOSIS — G301 Alzheimer's disease with late onset: Secondary | ICD-10-CM | POA: Diagnosis not present

## 2017-07-13 DIAGNOSIS — F0280 Dementia in other diseases classified elsewhere without behavioral disturbance: Secondary | ICD-10-CM | POA: Diagnosis not present

## 2017-07-13 DIAGNOSIS — G301 Alzheimer's disease with late onset: Secondary | ICD-10-CM | POA: Diagnosis not present

## 2017-07-16 DIAGNOSIS — F0280 Dementia in other diseases classified elsewhere without behavioral disturbance: Secondary | ICD-10-CM | POA: Diagnosis not present

## 2017-07-16 DIAGNOSIS — G301 Alzheimer's disease with late onset: Secondary | ICD-10-CM | POA: Diagnosis not present

## 2017-07-31 DIAGNOSIS — Z1389 Encounter for screening for other disorder: Secondary | ICD-10-CM | POA: Diagnosis not present

## 2017-07-31 DIAGNOSIS — Z0000 Encounter for general adult medical examination without abnormal findings: Secondary | ICD-10-CM | POA: Diagnosis not present

## 2017-07-31 DIAGNOSIS — M1990 Unspecified osteoarthritis, unspecified site: Secondary | ICD-10-CM | POA: Diagnosis not present

## 2017-07-31 DIAGNOSIS — R600 Localized edema: Secondary | ICD-10-CM | POA: Diagnosis not present

## 2017-07-31 DIAGNOSIS — G301 Alzheimer's disease with late onset: Secondary | ICD-10-CM | POA: Diagnosis not present

## 2017-07-31 DIAGNOSIS — Z13820 Encounter for screening for osteoporosis: Secondary | ICD-10-CM | POA: Diagnosis not present

## 2017-07-31 DIAGNOSIS — R7303 Prediabetes: Secondary | ICD-10-CM | POA: Diagnosis not present

## 2017-07-31 DIAGNOSIS — R269 Unspecified abnormalities of gait and mobility: Secondary | ICD-10-CM | POA: Diagnosis not present

## 2017-07-31 DIAGNOSIS — I5189 Other ill-defined heart diseases: Secondary | ICD-10-CM | POA: Diagnosis not present

## 2017-07-31 DIAGNOSIS — I10 Essential (primary) hypertension: Secondary | ICD-10-CM | POA: Diagnosis not present

## 2017-07-31 DIAGNOSIS — R296 Repeated falls: Secondary | ICD-10-CM | POA: Diagnosis not present

## 2017-07-31 DIAGNOSIS — E7800 Pure hypercholesterolemia, unspecified: Secondary | ICD-10-CM | POA: Diagnosis not present

## 2017-07-31 DIAGNOSIS — D696 Thrombocytopenia, unspecified: Secondary | ICD-10-CM | POA: Diagnosis not present

## 2017-07-31 DIAGNOSIS — D563 Thalassemia minor: Secondary | ICD-10-CM | POA: Diagnosis not present

## 2017-08-01 DIAGNOSIS — G301 Alzheimer's disease with late onset: Secondary | ICD-10-CM | POA: Diagnosis not present

## 2017-08-01 DIAGNOSIS — F0280 Dementia in other diseases classified elsewhere without behavioral disturbance: Secondary | ICD-10-CM | POA: Diagnosis not present

## 2017-08-02 DIAGNOSIS — F0280 Dementia in other diseases classified elsewhere without behavioral disturbance: Secondary | ICD-10-CM | POA: Diagnosis not present

## 2017-08-02 DIAGNOSIS — G301 Alzheimer's disease with late onset: Secondary | ICD-10-CM | POA: Diagnosis not present

## 2017-08-06 DIAGNOSIS — F0280 Dementia in other diseases classified elsewhere without behavioral disturbance: Secondary | ICD-10-CM | POA: Diagnosis not present

## 2017-08-06 DIAGNOSIS — G301 Alzheimer's disease with late onset: Secondary | ICD-10-CM | POA: Diagnosis not present

## 2017-08-07 ENCOUNTER — Encounter: Payer: Self-pay | Admitting: Podiatry

## 2017-08-07 ENCOUNTER — Ambulatory Visit (INDEPENDENT_AMBULATORY_CARE_PROVIDER_SITE_OTHER): Payer: Medicare Other | Admitting: Podiatry

## 2017-08-07 DIAGNOSIS — M79671 Pain in right foot: Secondary | ICD-10-CM | POA: Diagnosis not present

## 2017-08-07 DIAGNOSIS — M79672 Pain in left foot: Secondary | ICD-10-CM | POA: Diagnosis not present

## 2017-08-07 DIAGNOSIS — B351 Tinea unguium: Secondary | ICD-10-CM | POA: Diagnosis not present

## 2017-08-07 NOTE — Progress Notes (Signed)
Subjective: 84 y.o. year old female patient presents accompanied by her daughter complaining of painful nails. Patient walks with aid of wheeled walker.   Patient was diagnosed with Alzheimer's, Osteoarthritis of knees and legs. Objective: Dermatologic: Thick yellow deformed nails x 10. No open skin lesions noted. Vascular: Pedal pulses are all palpable on both feet. Mild forefoot edema on left foot.  Orthopedic: No gross deformities. Neurologic: All epicritic and tactile sensations grossly intact.  Assessment: Dystrophic mycotic nails x 10. Painful feet.  Treatment: All mycotic nails debrided.  Return in 3 months or as needed.

## 2017-08-07 NOTE — Patient Instructions (Signed)
Seen for hypertrophic nails. All nails debrided. Return in 3 months or as needed.  

## 2017-08-08 DIAGNOSIS — G301 Alzheimer's disease with late onset: Secondary | ICD-10-CM | POA: Diagnosis not present

## 2017-08-08 DIAGNOSIS — F0280 Dementia in other diseases classified elsewhere without behavioral disturbance: Secondary | ICD-10-CM | POA: Diagnosis not present

## 2017-08-09 DIAGNOSIS — M1712 Unilateral primary osteoarthritis, left knee: Secondary | ICD-10-CM | POA: Diagnosis not present

## 2017-08-09 DIAGNOSIS — M1711 Unilateral primary osteoarthritis, right knee: Secondary | ICD-10-CM | POA: Diagnosis not present

## 2017-08-13 DIAGNOSIS — G301 Alzheimer's disease with late onset: Secondary | ICD-10-CM | POA: Diagnosis not present

## 2017-08-13 DIAGNOSIS — F0280 Dementia in other diseases classified elsewhere without behavioral disturbance: Secondary | ICD-10-CM | POA: Diagnosis not present

## 2017-08-17 DIAGNOSIS — G301 Alzheimer's disease with late onset: Secondary | ICD-10-CM | POA: Diagnosis not present

## 2017-08-17 DIAGNOSIS — F0280 Dementia in other diseases classified elsewhere without behavioral disturbance: Secondary | ICD-10-CM | POA: Diagnosis not present

## 2017-08-21 DIAGNOSIS — G301 Alzheimer's disease with late onset: Secondary | ICD-10-CM | POA: Diagnosis not present

## 2017-08-21 DIAGNOSIS — F0280 Dementia in other diseases classified elsewhere without behavioral disturbance: Secondary | ICD-10-CM | POA: Diagnosis not present

## 2017-09-02 ENCOUNTER — Other Ambulatory Visit: Payer: Self-pay | Admitting: Hematology & Oncology

## 2017-09-02 DIAGNOSIS — D508 Other iron deficiency anemias: Secondary | ICD-10-CM

## 2017-09-02 DIAGNOSIS — D563 Thalassemia minor: Secondary | ICD-10-CM

## 2017-09-03 ENCOUNTER — Other Ambulatory Visit: Payer: Self-pay | Admitting: *Deleted

## 2017-09-03 DIAGNOSIS — D563 Thalassemia minor: Secondary | ICD-10-CM

## 2017-09-03 DIAGNOSIS — D508 Other iron deficiency anemias: Secondary | ICD-10-CM

## 2017-09-03 MED ORDER — FOLIC ACID 1 MG PO TABS: ORAL_TABLET | ORAL | 0 refills | Status: DC

## 2017-09-27 DIAGNOSIS — M1711 Unilateral primary osteoarthritis, right knee: Secondary | ICD-10-CM | POA: Diagnosis not present

## 2017-09-27 DIAGNOSIS — M1712 Unilateral primary osteoarthritis, left knee: Secondary | ICD-10-CM | POA: Diagnosis not present

## 2017-09-29 DIAGNOSIS — N390 Urinary tract infection, site not specified: Secondary | ICD-10-CM | POA: Diagnosis not present

## 2017-09-29 DIAGNOSIS — R350 Frequency of micturition: Secondary | ICD-10-CM | POA: Diagnosis not present

## 2017-09-30 DIAGNOSIS — R350 Frequency of micturition: Secondary | ICD-10-CM | POA: Diagnosis not present

## 2017-11-08 ENCOUNTER — Ambulatory Visit: Payer: Medicare Other | Admitting: Podiatry

## 2017-12-06 DIAGNOSIS — M1711 Unilateral primary osteoarthritis, right knee: Secondary | ICD-10-CM | POA: Diagnosis not present

## 2017-12-06 DIAGNOSIS — M1712 Unilateral primary osteoarthritis, left knee: Secondary | ICD-10-CM | POA: Diagnosis not present

## 2017-12-20 DIAGNOSIS — E7800 Pure hypercholesterolemia, unspecified: Secondary | ICD-10-CM | POA: Diagnosis not present

## 2017-12-20 DIAGNOSIS — Z23 Encounter for immunization: Secondary | ICD-10-CM | POA: Diagnosis not present

## 2017-12-20 DIAGNOSIS — G301 Alzheimer's disease with late onset: Secondary | ICD-10-CM | POA: Diagnosis not present

## 2017-12-20 DIAGNOSIS — D696 Thrombocytopenia, unspecified: Secondary | ICD-10-CM | POA: Diagnosis not present

## 2017-12-20 DIAGNOSIS — D563 Thalassemia minor: Secondary | ICD-10-CM | POA: Diagnosis not present

## 2017-12-20 DIAGNOSIS — I10 Essential (primary) hypertension: Secondary | ICD-10-CM | POA: Diagnosis not present

## 2017-12-20 DIAGNOSIS — R269 Unspecified abnormalities of gait and mobility: Secondary | ICD-10-CM | POA: Diagnosis not present

## 2017-12-20 DIAGNOSIS — R7303 Prediabetes: Secondary | ICD-10-CM | POA: Diagnosis not present

## 2017-12-24 DIAGNOSIS — R7303 Prediabetes: Secondary | ICD-10-CM | POA: Diagnosis not present

## 2017-12-24 DIAGNOSIS — M7120 Synovial cyst of popliteal space [Baker], unspecified knee: Secondary | ICD-10-CM | POA: Diagnosis not present

## 2017-12-24 DIAGNOSIS — M8580 Other specified disorders of bone density and structure, unspecified site: Secondary | ICD-10-CM | POA: Diagnosis not present

## 2017-12-24 DIAGNOSIS — I119 Hypertensive heart disease without heart failure: Secondary | ICD-10-CM | POA: Diagnosis not present

## 2017-12-24 DIAGNOSIS — M4800 Spinal stenosis, site unspecified: Secondary | ICD-10-CM | POA: Diagnosis not present

## 2017-12-24 DIAGNOSIS — R600 Localized edema: Secondary | ICD-10-CM | POA: Diagnosis not present

## 2017-12-24 DIAGNOSIS — M170 Bilateral primary osteoarthritis of knee: Secondary | ICD-10-CM | POA: Diagnosis not present

## 2017-12-24 DIAGNOSIS — K5790 Diverticulosis of intestine, part unspecified, without perforation or abscess without bleeding: Secondary | ICD-10-CM | POA: Diagnosis not present

## 2017-12-24 DIAGNOSIS — D563 Thalassemia minor: Secondary | ICD-10-CM | POA: Diagnosis not present

## 2017-12-24 DIAGNOSIS — Z87891 Personal history of nicotine dependence: Secondary | ICD-10-CM | POA: Diagnosis not present

## 2017-12-24 DIAGNOSIS — D696 Thrombocytopenia, unspecified: Secondary | ICD-10-CM | POA: Diagnosis not present

## 2017-12-24 DIAGNOSIS — G301 Alzheimer's disease with late onset: Secondary | ICD-10-CM | POA: Diagnosis not present

## 2017-12-24 DIAGNOSIS — F0280 Dementia in other diseases classified elsewhere without behavioral disturbance: Secondary | ICD-10-CM | POA: Diagnosis not present

## 2017-12-24 DIAGNOSIS — Z9181 History of falling: Secondary | ICD-10-CM | POA: Diagnosis not present

## 2017-12-24 DIAGNOSIS — E7800 Pure hypercholesterolemia, unspecified: Secondary | ICD-10-CM | POA: Diagnosis not present

## 2017-12-26 DIAGNOSIS — F0280 Dementia in other diseases classified elsewhere without behavioral disturbance: Secondary | ICD-10-CM | POA: Diagnosis not present

## 2017-12-26 DIAGNOSIS — I119 Hypertensive heart disease without heart failure: Secondary | ICD-10-CM | POA: Diagnosis not present

## 2017-12-26 DIAGNOSIS — D563 Thalassemia minor: Secondary | ICD-10-CM | POA: Diagnosis not present

## 2017-12-26 DIAGNOSIS — D696 Thrombocytopenia, unspecified: Secondary | ICD-10-CM | POA: Diagnosis not present

## 2017-12-26 DIAGNOSIS — G301 Alzheimer's disease with late onset: Secondary | ICD-10-CM | POA: Diagnosis not present

## 2017-12-26 DIAGNOSIS — M170 Bilateral primary osteoarthritis of knee: Secondary | ICD-10-CM | POA: Diagnosis not present

## 2017-12-28 DIAGNOSIS — F0280 Dementia in other diseases classified elsewhere without behavioral disturbance: Secondary | ICD-10-CM | POA: Diagnosis not present

## 2017-12-28 DIAGNOSIS — I119 Hypertensive heart disease without heart failure: Secondary | ICD-10-CM | POA: Diagnosis not present

## 2017-12-28 DIAGNOSIS — M170 Bilateral primary osteoarthritis of knee: Secondary | ICD-10-CM | POA: Diagnosis not present

## 2017-12-28 DIAGNOSIS — G301 Alzheimer's disease with late onset: Secondary | ICD-10-CM | POA: Diagnosis not present

## 2017-12-28 DIAGNOSIS — D563 Thalassemia minor: Secondary | ICD-10-CM | POA: Diagnosis not present

## 2017-12-28 DIAGNOSIS — D696 Thrombocytopenia, unspecified: Secondary | ICD-10-CM | POA: Diagnosis not present

## 2017-12-31 DIAGNOSIS — F0280 Dementia in other diseases classified elsewhere without behavioral disturbance: Secondary | ICD-10-CM | POA: Diagnosis not present

## 2017-12-31 DIAGNOSIS — M170 Bilateral primary osteoarthritis of knee: Secondary | ICD-10-CM | POA: Diagnosis not present

## 2017-12-31 DIAGNOSIS — G301 Alzheimer's disease with late onset: Secondary | ICD-10-CM | POA: Diagnosis not present

## 2017-12-31 DIAGNOSIS — I119 Hypertensive heart disease without heart failure: Secondary | ICD-10-CM | POA: Diagnosis not present

## 2017-12-31 DIAGNOSIS — D696 Thrombocytopenia, unspecified: Secondary | ICD-10-CM | POA: Diagnosis not present

## 2017-12-31 DIAGNOSIS — D563 Thalassemia minor: Secondary | ICD-10-CM | POA: Diagnosis not present

## 2018-01-02 DIAGNOSIS — I119 Hypertensive heart disease without heart failure: Secondary | ICD-10-CM | POA: Diagnosis not present

## 2018-01-02 DIAGNOSIS — D563 Thalassemia minor: Secondary | ICD-10-CM | POA: Diagnosis not present

## 2018-01-02 DIAGNOSIS — F0280 Dementia in other diseases classified elsewhere without behavioral disturbance: Secondary | ICD-10-CM | POA: Diagnosis not present

## 2018-01-02 DIAGNOSIS — M170 Bilateral primary osteoarthritis of knee: Secondary | ICD-10-CM | POA: Diagnosis not present

## 2018-01-02 DIAGNOSIS — G301 Alzheimer's disease with late onset: Secondary | ICD-10-CM | POA: Diagnosis not present

## 2018-01-02 DIAGNOSIS — D696 Thrombocytopenia, unspecified: Secondary | ICD-10-CM | POA: Diagnosis not present

## 2018-01-07 DIAGNOSIS — D696 Thrombocytopenia, unspecified: Secondary | ICD-10-CM | POA: Diagnosis not present

## 2018-01-07 DIAGNOSIS — I119 Hypertensive heart disease without heart failure: Secondary | ICD-10-CM | POA: Diagnosis not present

## 2018-01-07 DIAGNOSIS — D563 Thalassemia minor: Secondary | ICD-10-CM | POA: Diagnosis not present

## 2018-01-07 DIAGNOSIS — M170 Bilateral primary osteoarthritis of knee: Secondary | ICD-10-CM | POA: Diagnosis not present

## 2018-01-07 DIAGNOSIS — F0280 Dementia in other diseases classified elsewhere without behavioral disturbance: Secondary | ICD-10-CM | POA: Diagnosis not present

## 2018-01-07 DIAGNOSIS — G301 Alzheimer's disease with late onset: Secondary | ICD-10-CM | POA: Diagnosis not present

## 2018-01-10 DIAGNOSIS — I119 Hypertensive heart disease without heart failure: Secondary | ICD-10-CM | POA: Diagnosis not present

## 2018-01-10 DIAGNOSIS — D563 Thalassemia minor: Secondary | ICD-10-CM | POA: Diagnosis not present

## 2018-01-10 DIAGNOSIS — D696 Thrombocytopenia, unspecified: Secondary | ICD-10-CM | POA: Diagnosis not present

## 2018-01-10 DIAGNOSIS — M170 Bilateral primary osteoarthritis of knee: Secondary | ICD-10-CM | POA: Diagnosis not present

## 2018-01-10 DIAGNOSIS — G301 Alzheimer's disease with late onset: Secondary | ICD-10-CM | POA: Diagnosis not present

## 2018-01-10 DIAGNOSIS — F0280 Dementia in other diseases classified elsewhere without behavioral disturbance: Secondary | ICD-10-CM | POA: Diagnosis not present

## 2018-01-14 DIAGNOSIS — I119 Hypertensive heart disease without heart failure: Secondary | ICD-10-CM | POA: Diagnosis not present

## 2018-01-14 DIAGNOSIS — D563 Thalassemia minor: Secondary | ICD-10-CM | POA: Diagnosis not present

## 2018-01-14 DIAGNOSIS — G301 Alzheimer's disease with late onset: Secondary | ICD-10-CM | POA: Diagnosis not present

## 2018-01-14 DIAGNOSIS — F0280 Dementia in other diseases classified elsewhere without behavioral disturbance: Secondary | ICD-10-CM | POA: Diagnosis not present

## 2018-01-14 DIAGNOSIS — D696 Thrombocytopenia, unspecified: Secondary | ICD-10-CM | POA: Diagnosis not present

## 2018-01-14 DIAGNOSIS — M170 Bilateral primary osteoarthritis of knee: Secondary | ICD-10-CM | POA: Diagnosis not present

## 2018-01-16 DIAGNOSIS — D696 Thrombocytopenia, unspecified: Secondary | ICD-10-CM | POA: Diagnosis not present

## 2018-01-16 DIAGNOSIS — M170 Bilateral primary osteoarthritis of knee: Secondary | ICD-10-CM | POA: Diagnosis not present

## 2018-01-16 DIAGNOSIS — G301 Alzheimer's disease with late onset: Secondary | ICD-10-CM | POA: Diagnosis not present

## 2018-01-16 DIAGNOSIS — I119 Hypertensive heart disease without heart failure: Secondary | ICD-10-CM | POA: Diagnosis not present

## 2018-01-16 DIAGNOSIS — D563 Thalassemia minor: Secondary | ICD-10-CM | POA: Diagnosis not present

## 2018-01-16 DIAGNOSIS — F0280 Dementia in other diseases classified elsewhere without behavioral disturbance: Secondary | ICD-10-CM | POA: Diagnosis not present

## 2018-01-22 DIAGNOSIS — D696 Thrombocytopenia, unspecified: Secondary | ICD-10-CM | POA: Diagnosis not present

## 2018-01-22 DIAGNOSIS — D563 Thalassemia minor: Secondary | ICD-10-CM | POA: Diagnosis not present

## 2018-01-22 DIAGNOSIS — F0280 Dementia in other diseases classified elsewhere without behavioral disturbance: Secondary | ICD-10-CM | POA: Diagnosis not present

## 2018-01-22 DIAGNOSIS — M170 Bilateral primary osteoarthritis of knee: Secondary | ICD-10-CM | POA: Diagnosis not present

## 2018-01-22 DIAGNOSIS — I119 Hypertensive heart disease without heart failure: Secondary | ICD-10-CM | POA: Diagnosis not present

## 2018-01-22 DIAGNOSIS — G301 Alzheimer's disease with late onset: Secondary | ICD-10-CM | POA: Diagnosis not present

## 2018-01-24 DIAGNOSIS — G301 Alzheimer's disease with late onset: Secondary | ICD-10-CM | POA: Diagnosis not present

## 2018-01-24 DIAGNOSIS — I119 Hypertensive heart disease without heart failure: Secondary | ICD-10-CM | POA: Diagnosis not present

## 2018-01-24 DIAGNOSIS — D696 Thrombocytopenia, unspecified: Secondary | ICD-10-CM | POA: Diagnosis not present

## 2018-01-24 DIAGNOSIS — D563 Thalassemia minor: Secondary | ICD-10-CM | POA: Diagnosis not present

## 2018-01-24 DIAGNOSIS — F0280 Dementia in other diseases classified elsewhere without behavioral disturbance: Secondary | ICD-10-CM | POA: Diagnosis not present

## 2018-01-24 DIAGNOSIS — M170 Bilateral primary osteoarthritis of knee: Secondary | ICD-10-CM | POA: Diagnosis not present

## 2018-01-29 DIAGNOSIS — D563 Thalassemia minor: Secondary | ICD-10-CM | POA: Diagnosis not present

## 2018-01-29 DIAGNOSIS — D696 Thrombocytopenia, unspecified: Secondary | ICD-10-CM | POA: Diagnosis not present

## 2018-01-29 DIAGNOSIS — I119 Hypertensive heart disease without heart failure: Secondary | ICD-10-CM | POA: Diagnosis not present

## 2018-01-29 DIAGNOSIS — F0280 Dementia in other diseases classified elsewhere without behavioral disturbance: Secondary | ICD-10-CM | POA: Diagnosis not present

## 2018-01-29 DIAGNOSIS — G301 Alzheimer's disease with late onset: Secondary | ICD-10-CM | POA: Diagnosis not present

## 2018-01-29 DIAGNOSIS — M170 Bilateral primary osteoarthritis of knee: Secondary | ICD-10-CM | POA: Diagnosis not present

## 2018-02-04 DIAGNOSIS — I119 Hypertensive heart disease without heart failure: Secondary | ICD-10-CM | POA: Diagnosis not present

## 2018-02-04 DIAGNOSIS — D563 Thalassemia minor: Secondary | ICD-10-CM | POA: Diagnosis not present

## 2018-02-04 DIAGNOSIS — D696 Thrombocytopenia, unspecified: Secondary | ICD-10-CM | POA: Diagnosis not present

## 2018-02-04 DIAGNOSIS — G301 Alzheimer's disease with late onset: Secondary | ICD-10-CM | POA: Diagnosis not present

## 2018-02-04 DIAGNOSIS — F0280 Dementia in other diseases classified elsewhere without behavioral disturbance: Secondary | ICD-10-CM | POA: Diagnosis not present

## 2018-02-04 DIAGNOSIS — M170 Bilateral primary osteoarthritis of knee: Secondary | ICD-10-CM | POA: Diagnosis not present

## 2018-02-07 DIAGNOSIS — F0280 Dementia in other diseases classified elsewhere without behavioral disturbance: Secondary | ICD-10-CM | POA: Diagnosis not present

## 2018-02-07 DIAGNOSIS — G301 Alzheimer's disease with late onset: Secondary | ICD-10-CM | POA: Diagnosis not present

## 2018-02-07 DIAGNOSIS — D563 Thalassemia minor: Secondary | ICD-10-CM | POA: Diagnosis not present

## 2018-02-07 DIAGNOSIS — I119 Hypertensive heart disease without heart failure: Secondary | ICD-10-CM | POA: Diagnosis not present

## 2018-02-07 DIAGNOSIS — M170 Bilateral primary osteoarthritis of knee: Secondary | ICD-10-CM | POA: Diagnosis not present

## 2018-02-07 DIAGNOSIS — D696 Thrombocytopenia, unspecified: Secondary | ICD-10-CM | POA: Diagnosis not present

## 2018-02-12 DIAGNOSIS — G301 Alzheimer's disease with late onset: Secondary | ICD-10-CM | POA: Diagnosis not present

## 2018-02-12 DIAGNOSIS — D563 Thalassemia minor: Secondary | ICD-10-CM | POA: Diagnosis not present

## 2018-02-12 DIAGNOSIS — I119 Hypertensive heart disease without heart failure: Secondary | ICD-10-CM | POA: Diagnosis not present

## 2018-02-12 DIAGNOSIS — F0280 Dementia in other diseases classified elsewhere without behavioral disturbance: Secondary | ICD-10-CM | POA: Diagnosis not present

## 2018-02-12 DIAGNOSIS — M170 Bilateral primary osteoarthritis of knee: Secondary | ICD-10-CM | POA: Diagnosis not present

## 2018-02-12 DIAGNOSIS — D696 Thrombocytopenia, unspecified: Secondary | ICD-10-CM | POA: Diagnosis not present

## 2018-02-19 DIAGNOSIS — M170 Bilateral primary osteoarthritis of knee: Secondary | ICD-10-CM | POA: Diagnosis not present

## 2018-02-19 DIAGNOSIS — F0280 Dementia in other diseases classified elsewhere without behavioral disturbance: Secondary | ICD-10-CM | POA: Diagnosis not present

## 2018-02-19 DIAGNOSIS — G301 Alzheimer's disease with late onset: Secondary | ICD-10-CM | POA: Diagnosis not present

## 2018-02-19 DIAGNOSIS — D563 Thalassemia minor: Secondary | ICD-10-CM | POA: Diagnosis not present

## 2018-02-19 DIAGNOSIS — I119 Hypertensive heart disease without heart failure: Secondary | ICD-10-CM | POA: Diagnosis not present

## 2018-02-19 DIAGNOSIS — D696 Thrombocytopenia, unspecified: Secondary | ICD-10-CM | POA: Diagnosis not present

## 2018-06-11 DIAGNOSIS — D696 Thrombocytopenia, unspecified: Secondary | ICD-10-CM | POA: Diagnosis not present

## 2018-06-11 DIAGNOSIS — E46 Unspecified protein-calorie malnutrition: Secondary | ICD-10-CM | POA: Diagnosis not present

## 2018-06-11 DIAGNOSIS — G301 Alzheimer's disease with late onset: Secondary | ICD-10-CM | POA: Diagnosis not present

## 2018-06-11 DIAGNOSIS — R7303 Prediabetes: Secondary | ICD-10-CM | POA: Diagnosis not present

## 2018-06-11 DIAGNOSIS — R29810 Facial weakness: Secondary | ICD-10-CM | POA: Diagnosis not present

## 2018-06-11 DIAGNOSIS — D563 Thalassemia minor: Secondary | ICD-10-CM | POA: Diagnosis not present

## 2018-06-11 DIAGNOSIS — R269 Unspecified abnormalities of gait and mobility: Secondary | ICD-10-CM | POA: Diagnosis not present

## 2018-06-11 DIAGNOSIS — I10 Essential (primary) hypertension: Secondary | ICD-10-CM | POA: Diagnosis not present

## 2018-07-23 DIAGNOSIS — K219 Gastro-esophageal reflux disease without esophagitis: Secondary | ICD-10-CM | POA: Diagnosis not present

## 2018-07-23 DIAGNOSIS — R05 Cough: Secondary | ICD-10-CM | POA: Diagnosis not present

## 2018-08-27 DIAGNOSIS — I5033 Acute on chronic diastolic (congestive) heart failure: Secondary | ICD-10-CM | POA: Diagnosis not present

## 2018-08-27 DIAGNOSIS — D563 Thalassemia minor: Secondary | ICD-10-CM | POA: Diagnosis not present

## 2018-08-27 DIAGNOSIS — I10 Essential (primary) hypertension: Secondary | ICD-10-CM | POA: Diagnosis not present

## 2018-08-27 DIAGNOSIS — R600 Localized edema: Secondary | ICD-10-CM | POA: Diagnosis not present

## 2018-08-27 DIAGNOSIS — D696 Thrombocytopenia, unspecified: Secondary | ICD-10-CM | POA: Diagnosis not present

## 2018-08-27 DIAGNOSIS — G301 Alzheimer's disease with late onset: Secondary | ICD-10-CM | POA: Diagnosis not present

## 2018-08-27 DIAGNOSIS — E46 Unspecified protein-calorie malnutrition: Secondary | ICD-10-CM | POA: Diagnosis not present

## 2018-08-27 DIAGNOSIS — R269 Unspecified abnormalities of gait and mobility: Secondary | ICD-10-CM | POA: Diagnosis not present

## 2018-08-27 DIAGNOSIS — Z0000 Encounter for general adult medical examination without abnormal findings: Secondary | ICD-10-CM | POA: Diagnosis not present

## 2018-08-27 DIAGNOSIS — Z1389 Encounter for screening for other disorder: Secondary | ICD-10-CM | POA: Diagnosis not present

## 2018-08-27 DIAGNOSIS — R7303 Prediabetes: Secondary | ICD-10-CM | POA: Diagnosis not present

## 2018-08-27 DIAGNOSIS — E7800 Pure hypercholesterolemia, unspecified: Secondary | ICD-10-CM | POA: Diagnosis not present

## 2018-09-04 DIAGNOSIS — I10 Essential (primary) hypertension: Secondary | ICD-10-CM | POA: Diagnosis not present

## 2018-09-04 DIAGNOSIS — R7303 Prediabetes: Secondary | ICD-10-CM | POA: Diagnosis not present

## 2018-09-04 DIAGNOSIS — E7800 Pure hypercholesterolemia, unspecified: Secondary | ICD-10-CM | POA: Diagnosis not present

## 2018-11-29 DIAGNOSIS — Z23 Encounter for immunization: Secondary | ICD-10-CM | POA: Diagnosis not present

## 2019-03-26 DIAGNOSIS — D563 Thalassemia minor: Secondary | ICD-10-CM | POA: Diagnosis not present

## 2019-03-26 DIAGNOSIS — I5189 Other ill-defined heart diseases: Secondary | ICD-10-CM | POA: Diagnosis not present

## 2019-03-26 DIAGNOSIS — I10 Essential (primary) hypertension: Secondary | ICD-10-CM | POA: Diagnosis not present

## 2019-03-26 DIAGNOSIS — G301 Alzheimer's disease with late onset: Secondary | ICD-10-CM | POA: Diagnosis not present

## 2019-03-26 DIAGNOSIS — D696 Thrombocytopenia, unspecified: Secondary | ICD-10-CM | POA: Diagnosis not present

## 2019-03-26 DIAGNOSIS — E46 Unspecified protein-calorie malnutrition: Secondary | ICD-10-CM | POA: Diagnosis not present

## 2019-03-26 DIAGNOSIS — R7303 Prediabetes: Secondary | ICD-10-CM | POA: Diagnosis not present

## 2019-04-18 DIAGNOSIS — T50B95A Adverse effect of other viral vaccines, initial encounter: Secondary | ICD-10-CM | POA: Diagnosis not present

## 2019-06-13 DIAGNOSIS — L249 Irritant contact dermatitis, unspecified cause: Secondary | ICD-10-CM | POA: Diagnosis not present

## 2019-07-21 DIAGNOSIS — R55 Syncope and collapse: Secondary | ICD-10-CM | POA: Diagnosis not present

## 2019-07-21 DIAGNOSIS — G459 Transient cerebral ischemic attack, unspecified: Secondary | ICD-10-CM | POA: Diagnosis not present

## 2019-07-22 DIAGNOSIS — G459 Transient cerebral ischemic attack, unspecified: Secondary | ICD-10-CM | POA: Diagnosis not present

## 2019-07-22 DIAGNOSIS — Z7409 Other reduced mobility: Secondary | ICD-10-CM | POA: Diagnosis not present

## 2019-07-22 DIAGNOSIS — M1990 Unspecified osteoarthritis, unspecified site: Secondary | ICD-10-CM | POA: Diagnosis not present

## 2019-07-22 DIAGNOSIS — G309 Alzheimer's disease, unspecified: Secondary | ICD-10-CM | POA: Diagnosis not present

## 2019-08-12 DIAGNOSIS — D696 Thrombocytopenia, unspecified: Secondary | ICD-10-CM | POA: Diagnosis not present

## 2019-08-12 DIAGNOSIS — R7303 Prediabetes: Secondary | ICD-10-CM | POA: Diagnosis not present

## 2019-08-12 DIAGNOSIS — Z7982 Long term (current) use of aspirin: Secondary | ICD-10-CM | POA: Diagnosis not present

## 2019-08-12 DIAGNOSIS — M4800 Spinal stenosis, site unspecified: Secondary | ICD-10-CM | POA: Diagnosis not present

## 2019-08-12 DIAGNOSIS — J309 Allergic rhinitis, unspecified: Secondary | ICD-10-CM | POA: Diagnosis not present

## 2019-08-12 DIAGNOSIS — E7800 Pure hypercholesterolemia, unspecified: Secondary | ICD-10-CM | POA: Diagnosis not present

## 2019-08-12 DIAGNOSIS — Z9181 History of falling: Secondary | ICD-10-CM | POA: Diagnosis not present

## 2019-08-12 DIAGNOSIS — K219 Gastro-esophageal reflux disease without esophagitis: Secondary | ICD-10-CM | POA: Diagnosis not present

## 2019-08-12 DIAGNOSIS — M8580 Other specified disorders of bone density and structure, unspecified site: Secondary | ICD-10-CM | POA: Diagnosis not present

## 2019-08-12 DIAGNOSIS — R269 Unspecified abnormalities of gait and mobility: Secondary | ICD-10-CM | POA: Diagnosis not present

## 2019-08-12 DIAGNOSIS — F0280 Dementia in other diseases classified elsewhere without behavioral disturbance: Secondary | ICD-10-CM | POA: Diagnosis not present

## 2019-08-12 DIAGNOSIS — K5790 Diverticulosis of intestine, part unspecified, without perforation or abscess without bleeding: Secondary | ICD-10-CM | POA: Diagnosis not present

## 2019-08-12 DIAGNOSIS — R600 Localized edema: Secondary | ICD-10-CM | POA: Diagnosis not present

## 2019-08-12 DIAGNOSIS — M1990 Unspecified osteoarthritis, unspecified site: Secondary | ICD-10-CM | POA: Diagnosis not present

## 2019-08-12 DIAGNOSIS — D563 Thalassemia minor: Secondary | ICD-10-CM | POA: Diagnosis not present

## 2019-08-12 DIAGNOSIS — Z8673 Personal history of transient ischemic attack (TIA), and cerebral infarction without residual deficits: Secondary | ICD-10-CM | POA: Diagnosis not present

## 2019-08-12 DIAGNOSIS — M7120 Synovial cyst of popliteal space [Baker], unspecified knee: Secondary | ICD-10-CM | POA: Diagnosis not present

## 2019-08-12 DIAGNOSIS — I119 Hypertensive heart disease without heart failure: Secondary | ICD-10-CM | POA: Diagnosis not present

## 2019-08-12 DIAGNOSIS — L989 Disorder of the skin and subcutaneous tissue, unspecified: Secondary | ICD-10-CM | POA: Diagnosis not present

## 2019-08-12 DIAGNOSIS — Z87891 Personal history of nicotine dependence: Secondary | ICD-10-CM | POA: Diagnosis not present

## 2019-08-12 DIAGNOSIS — G301 Alzheimer's disease with late onset: Secondary | ICD-10-CM | POA: Diagnosis not present

## 2019-08-15 DIAGNOSIS — G301 Alzheimer's disease with late onset: Secondary | ICD-10-CM | POA: Diagnosis not present

## 2019-08-15 DIAGNOSIS — R269 Unspecified abnormalities of gait and mobility: Secondary | ICD-10-CM | POA: Diagnosis not present

## 2019-08-15 DIAGNOSIS — M4800 Spinal stenosis, site unspecified: Secondary | ICD-10-CM | POA: Diagnosis not present

## 2019-08-15 DIAGNOSIS — M1990 Unspecified osteoarthritis, unspecified site: Secondary | ICD-10-CM | POA: Diagnosis not present

## 2019-08-15 DIAGNOSIS — F0280 Dementia in other diseases classified elsewhere without behavioral disturbance: Secondary | ICD-10-CM | POA: Diagnosis not present

## 2019-08-15 DIAGNOSIS — I119 Hypertensive heart disease without heart failure: Secondary | ICD-10-CM | POA: Diagnosis not present

## 2019-08-18 DIAGNOSIS — G301 Alzheimer's disease with late onset: Secondary | ICD-10-CM | POA: Diagnosis not present

## 2019-08-18 DIAGNOSIS — R269 Unspecified abnormalities of gait and mobility: Secondary | ICD-10-CM | POA: Diagnosis not present

## 2019-08-18 DIAGNOSIS — I119 Hypertensive heart disease without heart failure: Secondary | ICD-10-CM | POA: Diagnosis not present

## 2019-08-18 DIAGNOSIS — M1990 Unspecified osteoarthritis, unspecified site: Secondary | ICD-10-CM | POA: Diagnosis not present

## 2019-08-18 DIAGNOSIS — M4800 Spinal stenosis, site unspecified: Secondary | ICD-10-CM | POA: Diagnosis not present

## 2019-08-18 DIAGNOSIS — F0280 Dementia in other diseases classified elsewhere without behavioral disturbance: Secondary | ICD-10-CM | POA: Diagnosis not present

## 2019-08-19 DIAGNOSIS — R269 Unspecified abnormalities of gait and mobility: Secondary | ICD-10-CM | POA: Diagnosis not present

## 2019-08-19 DIAGNOSIS — M4800 Spinal stenosis, site unspecified: Secondary | ICD-10-CM | POA: Diagnosis not present

## 2019-08-19 DIAGNOSIS — M1990 Unspecified osteoarthritis, unspecified site: Secondary | ICD-10-CM | POA: Diagnosis not present

## 2019-08-19 DIAGNOSIS — G301 Alzheimer's disease with late onset: Secondary | ICD-10-CM | POA: Diagnosis not present

## 2019-08-19 DIAGNOSIS — F0280 Dementia in other diseases classified elsewhere without behavioral disturbance: Secondary | ICD-10-CM | POA: Diagnosis not present

## 2019-08-19 DIAGNOSIS — I119 Hypertensive heart disease without heart failure: Secondary | ICD-10-CM | POA: Diagnosis not present

## 2019-08-21 DIAGNOSIS — I119 Hypertensive heart disease without heart failure: Secondary | ICD-10-CM | POA: Diagnosis not present

## 2019-08-21 DIAGNOSIS — M1990 Unspecified osteoarthritis, unspecified site: Secondary | ICD-10-CM | POA: Diagnosis not present

## 2019-08-21 DIAGNOSIS — R269 Unspecified abnormalities of gait and mobility: Secondary | ICD-10-CM | POA: Diagnosis not present

## 2019-08-21 DIAGNOSIS — F0280 Dementia in other diseases classified elsewhere without behavioral disturbance: Secondary | ICD-10-CM | POA: Diagnosis not present

## 2019-08-21 DIAGNOSIS — M4800 Spinal stenosis, site unspecified: Secondary | ICD-10-CM | POA: Diagnosis not present

## 2019-08-21 DIAGNOSIS — G301 Alzheimer's disease with late onset: Secondary | ICD-10-CM | POA: Diagnosis not present

## 2019-08-26 DIAGNOSIS — I119 Hypertensive heart disease without heart failure: Secondary | ICD-10-CM | POA: Diagnosis not present

## 2019-08-26 DIAGNOSIS — R269 Unspecified abnormalities of gait and mobility: Secondary | ICD-10-CM | POA: Diagnosis not present

## 2019-08-26 DIAGNOSIS — G301 Alzheimer's disease with late onset: Secondary | ICD-10-CM | POA: Diagnosis not present

## 2019-08-26 DIAGNOSIS — M4800 Spinal stenosis, site unspecified: Secondary | ICD-10-CM | POA: Diagnosis not present

## 2019-08-26 DIAGNOSIS — M1990 Unspecified osteoarthritis, unspecified site: Secondary | ICD-10-CM | POA: Diagnosis not present

## 2019-08-26 DIAGNOSIS — F0280 Dementia in other diseases classified elsewhere without behavioral disturbance: Secondary | ICD-10-CM | POA: Diagnosis not present

## 2019-08-28 DIAGNOSIS — F0280 Dementia in other diseases classified elsewhere without behavioral disturbance: Secondary | ICD-10-CM | POA: Diagnosis not present

## 2019-08-28 DIAGNOSIS — M1990 Unspecified osteoarthritis, unspecified site: Secondary | ICD-10-CM | POA: Diagnosis not present

## 2019-08-28 DIAGNOSIS — R269 Unspecified abnormalities of gait and mobility: Secondary | ICD-10-CM | POA: Diagnosis not present

## 2019-08-28 DIAGNOSIS — G301 Alzheimer's disease with late onset: Secondary | ICD-10-CM | POA: Diagnosis not present

## 2019-08-28 DIAGNOSIS — I119 Hypertensive heart disease without heart failure: Secondary | ICD-10-CM | POA: Diagnosis not present

## 2019-08-28 DIAGNOSIS — M4800 Spinal stenosis, site unspecified: Secondary | ICD-10-CM | POA: Diagnosis not present

## 2019-09-01 DIAGNOSIS — I119 Hypertensive heart disease without heart failure: Secondary | ICD-10-CM | POA: Diagnosis not present

## 2019-09-01 DIAGNOSIS — R269 Unspecified abnormalities of gait and mobility: Secondary | ICD-10-CM | POA: Diagnosis not present

## 2019-09-01 DIAGNOSIS — M1990 Unspecified osteoarthritis, unspecified site: Secondary | ICD-10-CM | POA: Diagnosis not present

## 2019-09-01 DIAGNOSIS — M4800 Spinal stenosis, site unspecified: Secondary | ICD-10-CM | POA: Diagnosis not present

## 2019-09-01 DIAGNOSIS — F0280 Dementia in other diseases classified elsewhere without behavioral disturbance: Secondary | ICD-10-CM | POA: Diagnosis not present

## 2019-09-01 DIAGNOSIS — G301 Alzheimer's disease with late onset: Secondary | ICD-10-CM | POA: Diagnosis not present

## 2019-09-03 DIAGNOSIS — F0280 Dementia in other diseases classified elsewhere without behavioral disturbance: Secondary | ICD-10-CM | POA: Diagnosis not present

## 2019-09-03 DIAGNOSIS — M1990 Unspecified osteoarthritis, unspecified site: Secondary | ICD-10-CM | POA: Diagnosis not present

## 2019-09-03 DIAGNOSIS — I119 Hypertensive heart disease without heart failure: Secondary | ICD-10-CM | POA: Diagnosis not present

## 2019-09-03 DIAGNOSIS — M4800 Spinal stenosis, site unspecified: Secondary | ICD-10-CM | POA: Diagnosis not present

## 2019-09-03 DIAGNOSIS — R269 Unspecified abnormalities of gait and mobility: Secondary | ICD-10-CM | POA: Diagnosis not present

## 2019-09-03 DIAGNOSIS — G301 Alzheimer's disease with late onset: Secondary | ICD-10-CM | POA: Diagnosis not present

## 2019-09-10 DIAGNOSIS — G301 Alzheimer's disease with late onset: Secondary | ICD-10-CM | POA: Diagnosis not present

## 2019-09-10 DIAGNOSIS — R269 Unspecified abnormalities of gait and mobility: Secondary | ICD-10-CM | POA: Diagnosis not present

## 2019-09-10 DIAGNOSIS — M1990 Unspecified osteoarthritis, unspecified site: Secondary | ICD-10-CM | POA: Diagnosis not present

## 2019-09-10 DIAGNOSIS — M4800 Spinal stenosis, site unspecified: Secondary | ICD-10-CM | POA: Diagnosis not present

## 2019-09-10 DIAGNOSIS — F0280 Dementia in other diseases classified elsewhere without behavioral disturbance: Secondary | ICD-10-CM | POA: Diagnosis not present

## 2019-09-10 DIAGNOSIS — I119 Hypertensive heart disease without heart failure: Secondary | ICD-10-CM | POA: Diagnosis not present

## 2019-09-11 DIAGNOSIS — M4800 Spinal stenosis, site unspecified: Secondary | ICD-10-CM | POA: Diagnosis not present

## 2019-09-11 DIAGNOSIS — J309 Allergic rhinitis, unspecified: Secondary | ICD-10-CM | POA: Diagnosis not present

## 2019-09-11 DIAGNOSIS — G301 Alzheimer's disease with late onset: Secondary | ICD-10-CM | POA: Diagnosis not present

## 2019-09-11 DIAGNOSIS — D696 Thrombocytopenia, unspecified: Secondary | ICD-10-CM | POA: Diagnosis not present

## 2019-09-11 DIAGNOSIS — F0280 Dementia in other diseases classified elsewhere without behavioral disturbance: Secondary | ICD-10-CM | POA: Diagnosis not present

## 2019-09-11 DIAGNOSIS — M7120 Synovial cyst of popliteal space [Baker], unspecified knee: Secondary | ICD-10-CM | POA: Diagnosis not present

## 2019-09-11 DIAGNOSIS — M1990 Unspecified osteoarthritis, unspecified site: Secondary | ICD-10-CM | POA: Diagnosis not present

## 2019-09-11 DIAGNOSIS — Z87891 Personal history of nicotine dependence: Secondary | ICD-10-CM | POA: Diagnosis not present

## 2019-09-11 DIAGNOSIS — E7800 Pure hypercholesterolemia, unspecified: Secondary | ICD-10-CM | POA: Diagnosis not present

## 2019-09-11 DIAGNOSIS — M8580 Other specified disorders of bone density and structure, unspecified site: Secondary | ICD-10-CM | POA: Diagnosis not present

## 2019-09-11 DIAGNOSIS — Z7982 Long term (current) use of aspirin: Secondary | ICD-10-CM | POA: Diagnosis not present

## 2019-09-11 DIAGNOSIS — K5790 Diverticulosis of intestine, part unspecified, without perforation or abscess without bleeding: Secondary | ICD-10-CM | POA: Diagnosis not present

## 2019-09-11 DIAGNOSIS — Z9181 History of falling: Secondary | ICD-10-CM | POA: Diagnosis not present

## 2019-09-11 DIAGNOSIS — L989 Disorder of the skin and subcutaneous tissue, unspecified: Secondary | ICD-10-CM | POA: Diagnosis not present

## 2019-09-11 DIAGNOSIS — K219 Gastro-esophageal reflux disease without esophagitis: Secondary | ICD-10-CM | POA: Diagnosis not present

## 2019-09-11 DIAGNOSIS — Z8673 Personal history of transient ischemic attack (TIA), and cerebral infarction without residual deficits: Secondary | ICD-10-CM | POA: Diagnosis not present

## 2019-09-11 DIAGNOSIS — D563 Thalassemia minor: Secondary | ICD-10-CM | POA: Diagnosis not present

## 2019-09-11 DIAGNOSIS — I119 Hypertensive heart disease without heart failure: Secondary | ICD-10-CM | POA: Diagnosis not present

## 2019-09-11 DIAGNOSIS — R269 Unspecified abnormalities of gait and mobility: Secondary | ICD-10-CM | POA: Diagnosis not present

## 2019-09-11 DIAGNOSIS — R7303 Prediabetes: Secondary | ICD-10-CM | POA: Diagnosis not present

## 2019-09-11 DIAGNOSIS — R600 Localized edema: Secondary | ICD-10-CM | POA: Diagnosis not present

## 2019-09-16 DIAGNOSIS — M1990 Unspecified osteoarthritis, unspecified site: Secondary | ICD-10-CM | POA: Diagnosis not present

## 2019-09-16 DIAGNOSIS — M4800 Spinal stenosis, site unspecified: Secondary | ICD-10-CM | POA: Diagnosis not present

## 2019-09-16 DIAGNOSIS — F0280 Dementia in other diseases classified elsewhere without behavioral disturbance: Secondary | ICD-10-CM | POA: Diagnosis not present

## 2019-09-16 DIAGNOSIS — R269 Unspecified abnormalities of gait and mobility: Secondary | ICD-10-CM | POA: Diagnosis not present

## 2019-09-16 DIAGNOSIS — G301 Alzheimer's disease with late onset: Secondary | ICD-10-CM | POA: Diagnosis not present

## 2019-09-16 DIAGNOSIS — I119 Hypertensive heart disease without heart failure: Secondary | ICD-10-CM | POA: Diagnosis not present

## 2019-09-17 DIAGNOSIS — I119 Hypertensive heart disease without heart failure: Secondary | ICD-10-CM | POA: Diagnosis not present

## 2019-09-17 DIAGNOSIS — M4800 Spinal stenosis, site unspecified: Secondary | ICD-10-CM | POA: Diagnosis not present

## 2019-09-17 DIAGNOSIS — F0280 Dementia in other diseases classified elsewhere without behavioral disturbance: Secondary | ICD-10-CM | POA: Diagnosis not present

## 2019-09-17 DIAGNOSIS — M1990 Unspecified osteoarthritis, unspecified site: Secondary | ICD-10-CM | POA: Diagnosis not present

## 2019-09-17 DIAGNOSIS — R269 Unspecified abnormalities of gait and mobility: Secondary | ICD-10-CM | POA: Diagnosis not present

## 2019-09-17 DIAGNOSIS — G301 Alzheimer's disease with late onset: Secondary | ICD-10-CM | POA: Diagnosis not present

## 2019-09-23 DIAGNOSIS — I119 Hypertensive heart disease without heart failure: Secondary | ICD-10-CM | POA: Diagnosis not present

## 2019-09-23 DIAGNOSIS — G301 Alzheimer's disease with late onset: Secondary | ICD-10-CM | POA: Diagnosis not present

## 2019-09-23 DIAGNOSIS — F0280 Dementia in other diseases classified elsewhere without behavioral disturbance: Secondary | ICD-10-CM | POA: Diagnosis not present

## 2019-09-23 DIAGNOSIS — M1990 Unspecified osteoarthritis, unspecified site: Secondary | ICD-10-CM | POA: Diagnosis not present

## 2019-09-23 DIAGNOSIS — R269 Unspecified abnormalities of gait and mobility: Secondary | ICD-10-CM | POA: Diagnosis not present

## 2019-09-23 DIAGNOSIS — M4800 Spinal stenosis, site unspecified: Secondary | ICD-10-CM | POA: Diagnosis not present

## 2019-09-26 DIAGNOSIS — M1990 Unspecified osteoarthritis, unspecified site: Secondary | ICD-10-CM | POA: Diagnosis not present

## 2019-09-26 DIAGNOSIS — I119 Hypertensive heart disease without heart failure: Secondary | ICD-10-CM | POA: Diagnosis not present

## 2019-09-26 DIAGNOSIS — R269 Unspecified abnormalities of gait and mobility: Secondary | ICD-10-CM | POA: Diagnosis not present

## 2019-09-26 DIAGNOSIS — F0280 Dementia in other diseases classified elsewhere without behavioral disturbance: Secondary | ICD-10-CM | POA: Diagnosis not present

## 2019-09-26 DIAGNOSIS — M4800 Spinal stenosis, site unspecified: Secondary | ICD-10-CM | POA: Diagnosis not present

## 2019-09-26 DIAGNOSIS — G301 Alzheimer's disease with late onset: Secondary | ICD-10-CM | POA: Diagnosis not present

## 2019-09-30 DIAGNOSIS — R269 Unspecified abnormalities of gait and mobility: Secondary | ICD-10-CM | POA: Diagnosis not present

## 2019-09-30 DIAGNOSIS — M1990 Unspecified osteoarthritis, unspecified site: Secondary | ICD-10-CM | POA: Diagnosis not present

## 2019-09-30 DIAGNOSIS — M4800 Spinal stenosis, site unspecified: Secondary | ICD-10-CM | POA: Diagnosis not present

## 2019-09-30 DIAGNOSIS — F0280 Dementia in other diseases classified elsewhere without behavioral disturbance: Secondary | ICD-10-CM | POA: Diagnosis not present

## 2019-09-30 DIAGNOSIS — I119 Hypertensive heart disease without heart failure: Secondary | ICD-10-CM | POA: Diagnosis not present

## 2019-09-30 DIAGNOSIS — G301 Alzheimer's disease with late onset: Secondary | ICD-10-CM | POA: Diagnosis not present

## 2019-10-01 DIAGNOSIS — G301 Alzheimer's disease with late onset: Secondary | ICD-10-CM | POA: Diagnosis not present

## 2019-10-01 DIAGNOSIS — I119 Hypertensive heart disease without heart failure: Secondary | ICD-10-CM | POA: Diagnosis not present

## 2019-10-01 DIAGNOSIS — M4800 Spinal stenosis, site unspecified: Secondary | ICD-10-CM | POA: Diagnosis not present

## 2019-10-01 DIAGNOSIS — R269 Unspecified abnormalities of gait and mobility: Secondary | ICD-10-CM | POA: Diagnosis not present

## 2019-10-01 DIAGNOSIS — M1990 Unspecified osteoarthritis, unspecified site: Secondary | ICD-10-CM | POA: Diagnosis not present

## 2019-10-01 DIAGNOSIS — F0280 Dementia in other diseases classified elsewhere without behavioral disturbance: Secondary | ICD-10-CM | POA: Diagnosis not present

## 2019-10-07 DIAGNOSIS — F0280 Dementia in other diseases classified elsewhere without behavioral disturbance: Secondary | ICD-10-CM | POA: Diagnosis not present

## 2019-10-07 DIAGNOSIS — M4800 Spinal stenosis, site unspecified: Secondary | ICD-10-CM | POA: Diagnosis not present

## 2019-10-07 DIAGNOSIS — M1990 Unspecified osteoarthritis, unspecified site: Secondary | ICD-10-CM | POA: Diagnosis not present

## 2019-10-07 DIAGNOSIS — G301 Alzheimer's disease with late onset: Secondary | ICD-10-CM | POA: Diagnosis not present

## 2019-10-07 DIAGNOSIS — R269 Unspecified abnormalities of gait and mobility: Secondary | ICD-10-CM | POA: Diagnosis not present

## 2019-10-07 DIAGNOSIS — I119 Hypertensive heart disease without heart failure: Secondary | ICD-10-CM | POA: Diagnosis not present

## 2019-10-08 DIAGNOSIS — M1990 Unspecified osteoarthritis, unspecified site: Secondary | ICD-10-CM | POA: Diagnosis not present

## 2019-10-08 DIAGNOSIS — G301 Alzheimer's disease with late onset: Secondary | ICD-10-CM | POA: Diagnosis not present

## 2019-10-08 DIAGNOSIS — R269 Unspecified abnormalities of gait and mobility: Secondary | ICD-10-CM | POA: Diagnosis not present

## 2019-10-08 DIAGNOSIS — F0280 Dementia in other diseases classified elsewhere without behavioral disturbance: Secondary | ICD-10-CM | POA: Diagnosis not present

## 2019-10-08 DIAGNOSIS — M4800 Spinal stenosis, site unspecified: Secondary | ICD-10-CM | POA: Diagnosis not present

## 2019-10-08 DIAGNOSIS — I119 Hypertensive heart disease without heart failure: Secondary | ICD-10-CM | POA: Diagnosis not present

## 2019-10-11 DIAGNOSIS — I119 Hypertensive heart disease without heart failure: Secondary | ICD-10-CM | POA: Diagnosis not present

## 2019-10-11 DIAGNOSIS — E7800 Pure hypercholesterolemia, unspecified: Secondary | ICD-10-CM | POA: Diagnosis not present

## 2019-10-11 DIAGNOSIS — M4800 Spinal stenosis, site unspecified: Secondary | ICD-10-CM | POA: Diagnosis not present

## 2019-10-11 DIAGNOSIS — R7303 Prediabetes: Secondary | ICD-10-CM | POA: Diagnosis not present

## 2019-10-11 DIAGNOSIS — R600 Localized edema: Secondary | ICD-10-CM | POA: Diagnosis not present

## 2019-10-11 DIAGNOSIS — Z8673 Personal history of transient ischemic attack (TIA), and cerebral infarction without residual deficits: Secondary | ICD-10-CM | POA: Diagnosis not present

## 2019-10-11 DIAGNOSIS — D563 Thalassemia minor: Secondary | ICD-10-CM | POA: Diagnosis not present

## 2019-10-11 DIAGNOSIS — R269 Unspecified abnormalities of gait and mobility: Secondary | ICD-10-CM | POA: Diagnosis not present

## 2019-10-11 DIAGNOSIS — L989 Disorder of the skin and subcutaneous tissue, unspecified: Secondary | ICD-10-CM | POA: Diagnosis not present

## 2019-10-11 DIAGNOSIS — M8580 Other specified disorders of bone density and structure, unspecified site: Secondary | ICD-10-CM | POA: Diagnosis not present

## 2019-10-11 DIAGNOSIS — M7120 Synovial cyst of popliteal space [Baker], unspecified knee: Secondary | ICD-10-CM | POA: Diagnosis not present

## 2019-10-11 DIAGNOSIS — G301 Alzheimer's disease with late onset: Secondary | ICD-10-CM | POA: Diagnosis not present

## 2019-10-11 DIAGNOSIS — Z7982 Long term (current) use of aspirin: Secondary | ICD-10-CM | POA: Diagnosis not present

## 2019-10-11 DIAGNOSIS — K219 Gastro-esophageal reflux disease without esophagitis: Secondary | ICD-10-CM | POA: Diagnosis not present

## 2019-10-11 DIAGNOSIS — Z87891 Personal history of nicotine dependence: Secondary | ICD-10-CM | POA: Diagnosis not present

## 2019-10-11 DIAGNOSIS — R2689 Other abnormalities of gait and mobility: Secondary | ICD-10-CM | POA: Diagnosis not present

## 2019-10-11 DIAGNOSIS — F0280 Dementia in other diseases classified elsewhere without behavioral disturbance: Secondary | ICD-10-CM | POA: Diagnosis not present

## 2019-10-11 DIAGNOSIS — M1990 Unspecified osteoarthritis, unspecified site: Secondary | ICD-10-CM | POA: Diagnosis not present

## 2019-10-11 DIAGNOSIS — Z9181 History of falling: Secondary | ICD-10-CM | POA: Diagnosis not present

## 2019-10-11 DIAGNOSIS — J309 Allergic rhinitis, unspecified: Secondary | ICD-10-CM | POA: Diagnosis not present

## 2019-10-11 DIAGNOSIS — D696 Thrombocytopenia, unspecified: Secondary | ICD-10-CM | POA: Diagnosis not present

## 2019-10-11 DIAGNOSIS — K5790 Diverticulosis of intestine, part unspecified, without perforation or abscess without bleeding: Secondary | ICD-10-CM | POA: Diagnosis not present

## 2019-10-14 DIAGNOSIS — M1990 Unspecified osteoarthritis, unspecified site: Secondary | ICD-10-CM | POA: Diagnosis not present

## 2019-10-14 DIAGNOSIS — I119 Hypertensive heart disease without heart failure: Secondary | ICD-10-CM | POA: Diagnosis not present

## 2019-10-14 DIAGNOSIS — F0280 Dementia in other diseases classified elsewhere without behavioral disturbance: Secondary | ICD-10-CM | POA: Diagnosis not present

## 2019-10-14 DIAGNOSIS — R2689 Other abnormalities of gait and mobility: Secondary | ICD-10-CM | POA: Diagnosis not present

## 2019-10-14 DIAGNOSIS — G301 Alzheimer's disease with late onset: Secondary | ICD-10-CM | POA: Diagnosis not present

## 2019-10-14 DIAGNOSIS — M4800 Spinal stenosis, site unspecified: Secondary | ICD-10-CM | POA: Diagnosis not present

## 2019-10-17 DIAGNOSIS — M1990 Unspecified osteoarthritis, unspecified site: Secondary | ICD-10-CM | POA: Diagnosis not present

## 2019-10-17 DIAGNOSIS — M4800 Spinal stenosis, site unspecified: Secondary | ICD-10-CM | POA: Diagnosis not present

## 2019-10-17 DIAGNOSIS — F0280 Dementia in other diseases classified elsewhere without behavioral disturbance: Secondary | ICD-10-CM | POA: Diagnosis not present

## 2019-10-17 DIAGNOSIS — R2689 Other abnormalities of gait and mobility: Secondary | ICD-10-CM | POA: Diagnosis not present

## 2019-10-17 DIAGNOSIS — G301 Alzheimer's disease with late onset: Secondary | ICD-10-CM | POA: Diagnosis not present

## 2019-10-17 DIAGNOSIS — I119 Hypertensive heart disease without heart failure: Secondary | ICD-10-CM | POA: Diagnosis not present

## 2019-10-20 DIAGNOSIS — I119 Hypertensive heart disease without heart failure: Secondary | ICD-10-CM | POA: Diagnosis not present

## 2019-10-20 DIAGNOSIS — M4800 Spinal stenosis, site unspecified: Secondary | ICD-10-CM | POA: Diagnosis not present

## 2019-10-20 DIAGNOSIS — M1990 Unspecified osteoarthritis, unspecified site: Secondary | ICD-10-CM | POA: Diagnosis not present

## 2019-10-20 DIAGNOSIS — R2689 Other abnormalities of gait and mobility: Secondary | ICD-10-CM | POA: Diagnosis not present

## 2019-10-20 DIAGNOSIS — F0280 Dementia in other diseases classified elsewhere without behavioral disturbance: Secondary | ICD-10-CM | POA: Diagnosis not present

## 2019-10-20 DIAGNOSIS — G301 Alzheimer's disease with late onset: Secondary | ICD-10-CM | POA: Diagnosis not present

## 2019-10-22 DIAGNOSIS — R2689 Other abnormalities of gait and mobility: Secondary | ICD-10-CM | POA: Diagnosis not present

## 2019-10-22 DIAGNOSIS — I119 Hypertensive heart disease without heart failure: Secondary | ICD-10-CM | POA: Diagnosis not present

## 2019-10-22 DIAGNOSIS — M4800 Spinal stenosis, site unspecified: Secondary | ICD-10-CM | POA: Diagnosis not present

## 2019-10-22 DIAGNOSIS — M1990 Unspecified osteoarthritis, unspecified site: Secondary | ICD-10-CM | POA: Diagnosis not present

## 2019-10-22 DIAGNOSIS — F0280 Dementia in other diseases classified elsewhere without behavioral disturbance: Secondary | ICD-10-CM | POA: Diagnosis not present

## 2019-10-22 DIAGNOSIS — G301 Alzheimer's disease with late onset: Secondary | ICD-10-CM | POA: Diagnosis not present

## 2019-10-23 DIAGNOSIS — R296 Repeated falls: Secondary | ICD-10-CM | POA: Diagnosis not present

## 2019-10-23 DIAGNOSIS — I5189 Other ill-defined heart diseases: Secondary | ICD-10-CM | POA: Diagnosis not present

## 2019-10-23 DIAGNOSIS — K219 Gastro-esophageal reflux disease without esophagitis: Secondary | ICD-10-CM | POA: Diagnosis not present

## 2019-10-23 DIAGNOSIS — E7800 Pure hypercholesterolemia, unspecified: Secondary | ICD-10-CM | POA: Diagnosis not present

## 2019-10-23 DIAGNOSIS — D563 Thalassemia minor: Secondary | ICD-10-CM | POA: Diagnosis not present

## 2019-10-23 DIAGNOSIS — I10 Essential (primary) hypertension: Secondary | ICD-10-CM | POA: Diagnosis not present

## 2019-10-23 DIAGNOSIS — R269 Unspecified abnormalities of gait and mobility: Secondary | ICD-10-CM | POA: Diagnosis not present

## 2019-10-23 DIAGNOSIS — D696 Thrombocytopenia, unspecified: Secondary | ICD-10-CM | POA: Diagnosis not present

## 2019-10-23 DIAGNOSIS — R7309 Other abnormal glucose: Secondary | ICD-10-CM | POA: Diagnosis not present

## 2019-10-23 DIAGNOSIS — G301 Alzheimer's disease with late onset: Secondary | ICD-10-CM | POA: Diagnosis not present

## 2019-10-24 DIAGNOSIS — I119 Hypertensive heart disease without heart failure: Secondary | ICD-10-CM | POA: Diagnosis not present

## 2019-10-24 DIAGNOSIS — M1990 Unspecified osteoarthritis, unspecified site: Secondary | ICD-10-CM | POA: Diagnosis not present

## 2019-10-24 DIAGNOSIS — R2689 Other abnormalities of gait and mobility: Secondary | ICD-10-CM | POA: Diagnosis not present

## 2019-10-24 DIAGNOSIS — M4800 Spinal stenosis, site unspecified: Secondary | ICD-10-CM | POA: Diagnosis not present

## 2019-10-24 DIAGNOSIS — G301 Alzheimer's disease with late onset: Secondary | ICD-10-CM | POA: Diagnosis not present

## 2019-10-24 DIAGNOSIS — F0280 Dementia in other diseases classified elsewhere without behavioral disturbance: Secondary | ICD-10-CM | POA: Diagnosis not present

## 2019-10-28 DIAGNOSIS — R2689 Other abnormalities of gait and mobility: Secondary | ICD-10-CM | POA: Diagnosis not present

## 2019-10-28 DIAGNOSIS — I119 Hypertensive heart disease without heart failure: Secondary | ICD-10-CM | POA: Diagnosis not present

## 2019-10-28 DIAGNOSIS — G301 Alzheimer's disease with late onset: Secondary | ICD-10-CM | POA: Diagnosis not present

## 2019-10-28 DIAGNOSIS — M4800 Spinal stenosis, site unspecified: Secondary | ICD-10-CM | POA: Diagnosis not present

## 2019-10-28 DIAGNOSIS — M1990 Unspecified osteoarthritis, unspecified site: Secondary | ICD-10-CM | POA: Diagnosis not present

## 2019-10-28 DIAGNOSIS — F0280 Dementia in other diseases classified elsewhere without behavioral disturbance: Secondary | ICD-10-CM | POA: Diagnosis not present

## 2019-10-29 DIAGNOSIS — F0280 Dementia in other diseases classified elsewhere without behavioral disturbance: Secondary | ICD-10-CM | POA: Diagnosis not present

## 2019-10-29 DIAGNOSIS — G301 Alzheimer's disease with late onset: Secondary | ICD-10-CM | POA: Diagnosis not present

## 2019-10-29 DIAGNOSIS — R2689 Other abnormalities of gait and mobility: Secondary | ICD-10-CM | POA: Diagnosis not present

## 2019-10-29 DIAGNOSIS — M1990 Unspecified osteoarthritis, unspecified site: Secondary | ICD-10-CM | POA: Diagnosis not present

## 2019-10-29 DIAGNOSIS — I119 Hypertensive heart disease without heart failure: Secondary | ICD-10-CM | POA: Diagnosis not present

## 2019-10-29 DIAGNOSIS — M4800 Spinal stenosis, site unspecified: Secondary | ICD-10-CM | POA: Diagnosis not present

## 2019-10-30 DIAGNOSIS — M1990 Unspecified osteoarthritis, unspecified site: Secondary | ICD-10-CM | POA: Diagnosis not present

## 2019-10-30 DIAGNOSIS — I119 Hypertensive heart disease without heart failure: Secondary | ICD-10-CM | POA: Diagnosis not present

## 2019-10-30 DIAGNOSIS — M4800 Spinal stenosis, site unspecified: Secondary | ICD-10-CM | POA: Diagnosis not present

## 2019-10-30 DIAGNOSIS — R2689 Other abnormalities of gait and mobility: Secondary | ICD-10-CM | POA: Diagnosis not present

## 2019-10-30 DIAGNOSIS — G301 Alzheimer's disease with late onset: Secondary | ICD-10-CM | POA: Diagnosis not present

## 2019-10-30 DIAGNOSIS — F0280 Dementia in other diseases classified elsewhere without behavioral disturbance: Secondary | ICD-10-CM | POA: Diagnosis not present

## 2019-11-04 ENCOUNTER — Other Ambulatory Visit: Payer: Self-pay

## 2019-11-04 ENCOUNTER — Emergency Department (HOSPITAL_BASED_OUTPATIENT_CLINIC_OR_DEPARTMENT_OTHER)
Admission: EM | Admit: 2019-11-04 | Discharge: 2019-11-05 | Disposition: A | Discharge status: Home / Self Care | Payer: Medicare Other | Attending: Emergency Medicine | Admitting: Emergency Medicine

## 2019-11-04 ENCOUNTER — Encounter (HOSPITAL_BASED_OUTPATIENT_CLINIC_OR_DEPARTMENT_OTHER): Payer: Self-pay | Admitting: Emergency Medicine

## 2019-11-04 DIAGNOSIS — Z79899 Other long term (current) drug therapy: Secondary | ICD-10-CM | POA: Diagnosis not present

## 2019-11-04 DIAGNOSIS — M4800 Spinal stenosis, site unspecified: Secondary | ICD-10-CM | POA: Diagnosis not present

## 2019-11-04 DIAGNOSIS — N390 Urinary tract infection, site not specified: Secondary | ICD-10-CM | POA: Diagnosis not present

## 2019-11-04 DIAGNOSIS — G309 Alzheimer's disease, unspecified: Secondary | ICD-10-CM | POA: Insufficient documentation

## 2019-11-04 DIAGNOSIS — I10 Essential (primary) hypertension: Secondary | ICD-10-CM | POA: Diagnosis not present

## 2019-11-04 DIAGNOSIS — G301 Alzheimer's disease with late onset: Secondary | ICD-10-CM | POA: Diagnosis not present

## 2019-11-04 DIAGNOSIS — R531 Weakness: Secondary | ICD-10-CM | POA: Insufficient documentation

## 2019-11-04 DIAGNOSIS — R2689 Other abnormalities of gait and mobility: Secondary | ICD-10-CM | POA: Diagnosis not present

## 2019-11-04 DIAGNOSIS — M1990 Unspecified osteoarthritis, unspecified site: Secondary | ICD-10-CM | POA: Diagnosis not present

## 2019-11-04 DIAGNOSIS — F0280 Dementia in other diseases classified elsewhere without behavioral disturbance: Secondary | ICD-10-CM | POA: Diagnosis not present

## 2019-11-04 DIAGNOSIS — I119 Hypertensive heart disease without heart failure: Secondary | ICD-10-CM | POA: Diagnosis not present

## 2019-11-04 LAB — URINALYSIS, ROUTINE W REFLEX MICROSCOPIC
Bilirubin Urine: NEGATIVE
Glucose, UA: NEGATIVE mg/dL
Ketones, ur: NEGATIVE mg/dL
Nitrite: NEGATIVE
Protein, ur: NEGATIVE mg/dL
Specific Gravity, Urine: >1.03 — ABNORMAL HIGH (ref 1.005–1.030)
pH: 5.5 (ref 5.0–8.0)

## 2019-11-04 LAB — CBC
HCT: 33.1 % — ABNORMAL LOW (ref 36.0–46.0)
Hemoglobin: 10.5 g/dL — ABNORMAL LOW (ref 12.0–15.0)
MCH: 24.5 pg — ABNORMAL LOW (ref 26.0–34.0)
MCHC: 31.7 g/dL (ref 30.0–36.0)
MCV: 77.2 fL — ABNORMAL LOW (ref 80.0–100.0)
Platelets: 127 10*3/uL — ABNORMAL LOW (ref 150–400)
RBC: 4.29 MIL/uL (ref 3.87–5.11)
RDW: 15.9 % — ABNORMAL HIGH (ref 11.5–15.5)
WBC: 4 10*3/uL (ref 4.0–10.5)
nRBC: 0 % (ref 0.0–0.2)

## 2019-11-04 LAB — BASIC METABOLIC PANEL
Anion gap: 10 (ref 5–15)
BUN: 22 mg/dL (ref 8–23)
CO2: 26 mmol/L (ref 22–32)
Calcium: 9.5 mg/dL (ref 8.9–10.3)
Chloride: 103 mmol/L (ref 98–111)
Creatinine, Ser: 0.93 mg/dL (ref 0.44–1.00)
GFR calc Af Amer: >60 mL/min (ref >60)
GFR calc non Af Amer: 56 mL/min — ABNORMAL LOW (ref >60)
Glucose, Bld: 106 mg/dL — ABNORMAL HIGH (ref 70–99)
Potassium: 4.5 mmol/L (ref 3.5–5.1)
Sodium: 139 mmol/L (ref 135–145)

## 2019-11-04 LAB — URINALYSIS, MICROSCOPIC (REFLEX)

## 2019-11-04 MED ORDER — CEPHALEXIN 250 MG PO CAPS
500.0000 mg | ORAL_CAPSULE | Freq: Once | ORAL | Status: AC
  Administered 2019-11-04: 500 mg via ORAL
  Filled 2019-11-04: qty 2

## 2019-11-04 MED ORDER — CEPHALEXIN 500 MG PO CAPS: 500.0000 mg | ORAL_CAPSULE | Freq: Four times a day (QID) | ORAL | 0 refills | Status: DC

## 2019-11-04 NOTE — Discharge Instructions (Signed)
1.  Start taking your Keflex tomorrow morning. 2.  Return to the emergency department if you develop fevers, confusion, pain or other concerning symptoms. 3.  Make an appointment to see your doctor for recheck within the next 3 to 5 days.

## 2019-11-04 NOTE — ED Triage Notes (Signed)
Per daughter patient has been weaker than usually having difficulty standing, and some slurred speech.  Endorses that she does have alzheimers so her speech is slurred sometimes.  Reports LKW to be Thursday.

## 2019-11-04 NOTE — ED Provider Notes (Signed)
[name] Provider Note   CSN: [id number] Arrival date & time: 11/04/19  1742     History Chief Complaint  Patient presents with  . Weakness  . Aphasia    Tara Dixon is a 86 y.o. female.  HPI Patient is cared for by her daughter and caregivers in her home.  She does have Alzheimer's but usually is interactive and answers questions.  She also does physical and Occupational Therapy.  Her therapist noted last week she seemed somewhat more weak than usual.  She had not gotten up out of bed as she typically did until much later in the day.  There has not been any focal weakness, no focal extremity dysfunction.  Several days ago it was noted that her speech seemed somewhat slurred at times.  Typically she responds with short sentences.  Patient daughter reports that the patient is eating well.  She is not having any vomiting, no fever, no pain complaints.  She has been immunized for Covid.  She was seen last week at the emergency department out of town and at that time evaluated and thought to have possibly had a mini stroke but was discharged home and has not had focal dysfunction.    Past Medical History:  Diagnosis Date  . Allergy   . Alzheimer disease ([place])   . Anemia   . Arthritis   . Hypertension     There are no problems to display for this patient.   Past Surgical History:  Procedure Laterality Date  . ABDOMINAL HYSTERECTOMY    . ROTATOR CUFF REPAIR Right      OB History   No obstetric history on file.     Family History  Problem Relation Age of Onset  . Hypertension Mother   . Stroke Mother   . Hypertension Father   . Stroke Father   . Hypertension Sister     Social History   Tobacco Use  . Smoking status: Never Smoker  . Smokeless tobacco: Never Used  Substance Use Topics  . Alcohol use: Not on file  . Drug use: Not on file    Home Medications Prior to Admission medications   Medication Sig Start Date End Date  Taking? Authorizing Provider  amLODipine (NORVASC) 5 MG tablet Take 5 mg by mouth daily.    [provider]  Calcium Carb-Cholecalciferol (CALCIUM + D3 PO) Take by mouth.    [provider]  cephALEXin (KEFLEX) 500 MG capsule Take 1 capsule (500 mg total) by mouth 4 (four) times daily. 11/04/19   Charlesetta Shanks, MD  ciprofloxacin (CIPRO) 250 MG tablet Take 1 tablet (250 mg total) by mouth every 12 (twelve) hours. 07/04/17   Julianne Rice, MD  folic acid (FOLVITE) 1 MG tablet TAKE 2 TABLETS(2 MG) BY MOUTH DAILY 09/03/17   Volanda Napoleon, MD  hydrochlorothiazide (MICROZIDE) 12.5 MG capsule Take 12.5 mg by mouth daily.    [provider]  memantine (NAMENDA XR) 28 MG CP24 24 hr capsule Take 28 mg by mouth. 06/28/15   [provider]  Multiple Vitamins-Minerals (CENTRUM ADULTS PO) Take by mouth.    [provider]  Omega-3 Fatty Acids (OMEGA 3 PO) Take by mouth.    [provider]    Allergies    Ampicillin, Bactrim [sulfamethoxazole-trimethoprim], Celebrex [celecoxib], and Polysporin [bacitracin-polymyxin b]  Review of Systems   Review of Systems 10 systems reviewed and negative except as per HPI Physical Exam Updated Vital Signs BP 135/77 (  BP Location: Right Arm)   Pulse 60   Temp 98.3 F (36.8 C) (Oral)   Resp 15   Ht 5\' 3"  (1.6 m)   Wt 77.6 kg   SpO2 97%   BMI 30.30 kg/m   Physical Exam Constitutional:      Comments: Patient is alert.  She is nontoxic in appearance.  No respiratory distress.  Calm and cooperative.  HENT:     Head: Normocephalic and atraumatic.     Mouth/Throat:     Pharynx: Oropharynx is clear.  Eyes:     Extraocular Movements: Extraocular movements intact.  Cardiovascular:     Rate and Rhythm: Normal rate and regular rhythm.  Pulmonary:     Effort: Pulmonary effort is normal.     Breath sounds: Normal breath sounds.  Abdominal:     General: There is no distension.     Palpations: Abdomen is soft.       Tenderness: There is no abdominal tenderness. There is no guarding.  Musculoskeletal:     Comments: 1+ edema bilateral feet.  Condition of feet and lower legs is very good.  No wounds.  No skin changes.  Skin:    General: Skin is warm and dry.  Neurological:     Comments: Patient is alert.  She answers questions with simple responses that are appropriate.  She is clear.  Follows commands to grip with both hands.  She can elevate each leg independently off the bed but seems slightly weak in the lower extremities.  No focal deficits.  Psychiatric:        Mood and Affect: Mood normal.     ED Results / Procedures / Treatments   Labs (all labs ordered are listed, but only abnormal results are displayed) Labs Reviewed  BASIC METABOLIC PANEL - Abnormal; Notable for the following components:      Result Value   Glucose, Bld 106 (*)    GFR calc non Af Amer 56 (*)    All other components within normal limits  CBC - Abnormal; Notable for the following components:   Hemoglobin 10.5 (*)    HCT 33.1 (*)    MCV 77.2 (*)    MCH 24.5 (*)    RDW 15.9 (*)    Platelets 127 (*)    All other components within normal limits  URINALYSIS, ROUTINE W REFLEX MICROSCOPIC - Abnormal; Notable for the following components:   APPearance CLOUDY (*)    Specific Gravity, Urine >1.030 (*)    Hgb urine dipstick TRACE (*)    Leukocytes,Ua SMALL (*)    All other components within normal limits  URINALYSIS, MICROSCOPIC (REFLEX) - Abnormal; Notable for the following components:   Bacteria, UA FEW (*)    All other components within normal limits  URINE CULTURE    EKG None  Radiology No results found.  Procedures Procedures (including critical care time)  Medications Ordered in ED Medications  cephALEXin (KEFLEX) capsule 500 mg (500 mg Oral Given 11/04/19 2347)    ED Course  I have reviewed the triage vital signs and the nursing notes.  Pertinent labs & imaging results that were available during my  care of the patient were reviewed by me and considered in my medical decision making (see chart for details).    MDM Rules/Calculators/A&P                         Patient presents as outlined.  She has had some  waxing and waning slurred speech over a number of days of by family and caregivers.  Speech is clear at this time.  Patient is following commands appropriately.  Urinalysis is trace positive.  Generalized symptoms of weakness and mild change in baseline function, will opt to treat.  Vital signs are stable.  No fever, no hypotension or tachycardia.  Patient has excellent care at home.  Patient's daughter and caregivers as well as physical therapy.  Return precautions and follow-up plan reviewed.  Patient is to start Keflex for UTI. Final Clinical Impression(s) / ED Diagnoses Final diagnoses:  Weakness  Lower urinary tract infectious disease    Rx / DC Orders ED Discharge Orders         Ordered    cephALEXin (KEFLEX) 500 MG capsule  4 times daily        11/04/19 2343           Charlesetta Shanks, MD 11/04/19 2358

## 2019-11-04 NOTE — ED Notes (Signed)
Daughter updated on wait.

## 2019-11-06 LAB — URINE CULTURE: Culture: NO GROWTH

## 2019-11-10 DIAGNOSIS — D696 Thrombocytopenia, unspecified: Secondary | ICD-10-CM | POA: Diagnosis not present

## 2019-11-10 DIAGNOSIS — I119 Hypertensive heart disease without heart failure: Secondary | ICD-10-CM | POA: Diagnosis not present

## 2019-11-10 DIAGNOSIS — K5790 Diverticulosis of intestine, part unspecified, without perforation or abscess without bleeding: Secondary | ICD-10-CM | POA: Diagnosis not present

## 2019-11-10 DIAGNOSIS — Z8673 Personal history of transient ischemic attack (TIA), and cerebral infarction without residual deficits: Secondary | ICD-10-CM | POA: Diagnosis not present

## 2019-11-10 DIAGNOSIS — J309 Allergic rhinitis, unspecified: Secondary | ICD-10-CM | POA: Diagnosis not present

## 2019-11-10 DIAGNOSIS — Z9181 History of falling: Secondary | ICD-10-CM | POA: Diagnosis not present

## 2019-11-10 DIAGNOSIS — M4800 Spinal stenosis, site unspecified: Secondary | ICD-10-CM | POA: Diagnosis not present

## 2019-11-10 DIAGNOSIS — K219 Gastro-esophageal reflux disease without esophagitis: Secondary | ICD-10-CM | POA: Diagnosis not present

## 2019-11-10 DIAGNOSIS — Z87891 Personal history of nicotine dependence: Secondary | ICD-10-CM | POA: Diagnosis not present

## 2019-11-10 DIAGNOSIS — D563 Thalassemia minor: Secondary | ICD-10-CM | POA: Diagnosis not present

## 2019-11-10 DIAGNOSIS — R2689 Other abnormalities of gait and mobility: Secondary | ICD-10-CM | POA: Diagnosis not present

## 2019-11-10 DIAGNOSIS — R7303 Prediabetes: Secondary | ICD-10-CM | POA: Diagnosis not present

## 2019-11-10 DIAGNOSIS — F0280 Dementia in other diseases classified elsewhere without behavioral disturbance: Secondary | ICD-10-CM | POA: Diagnosis not present

## 2019-11-10 DIAGNOSIS — R600 Localized edema: Secondary | ICD-10-CM | POA: Diagnosis not present

## 2019-11-10 DIAGNOSIS — E7800 Pure hypercholesterolemia, unspecified: Secondary | ICD-10-CM | POA: Diagnosis not present

## 2019-11-10 DIAGNOSIS — M7120 Synovial cyst of popliteal space [Baker], unspecified knee: Secondary | ICD-10-CM | POA: Diagnosis not present

## 2019-11-10 DIAGNOSIS — G301 Alzheimer's disease with late onset: Secondary | ICD-10-CM | POA: Diagnosis not present

## 2019-11-10 DIAGNOSIS — M8580 Other specified disorders of bone density and structure, unspecified site: Secondary | ICD-10-CM | POA: Diagnosis not present

## 2019-11-10 DIAGNOSIS — Z7982 Long term (current) use of aspirin: Secondary | ICD-10-CM | POA: Diagnosis not present

## 2019-11-10 DIAGNOSIS — M1990 Unspecified osteoarthritis, unspecified site: Secondary | ICD-10-CM | POA: Diagnosis not present

## 2019-11-12 DIAGNOSIS — M1990 Unspecified osteoarthritis, unspecified site: Secondary | ICD-10-CM | POA: Diagnosis not present

## 2019-11-12 DIAGNOSIS — M4800 Spinal stenosis, site unspecified: Secondary | ICD-10-CM | POA: Diagnosis not present

## 2019-11-12 DIAGNOSIS — F0280 Dementia in other diseases classified elsewhere without behavioral disturbance: Secondary | ICD-10-CM | POA: Diagnosis not present

## 2019-11-12 DIAGNOSIS — I119 Hypertensive heart disease without heart failure: Secondary | ICD-10-CM | POA: Diagnosis not present

## 2019-11-12 DIAGNOSIS — R2689 Other abnormalities of gait and mobility: Secondary | ICD-10-CM | POA: Diagnosis not present

## 2019-11-12 DIAGNOSIS — G301 Alzheimer's disease with late onset: Secondary | ICD-10-CM | POA: Diagnosis not present

## 2019-11-14 DIAGNOSIS — R350 Frequency of micturition: Secondary | ICD-10-CM | POA: Diagnosis not present

## 2019-11-14 DIAGNOSIS — G301 Alzheimer's disease with late onset: Secondary | ICD-10-CM | POA: Diagnosis not present

## 2019-11-14 DIAGNOSIS — I119 Hypertensive heart disease without heart failure: Secondary | ICD-10-CM | POA: Diagnosis not present

## 2019-11-14 DIAGNOSIS — M1990 Unspecified osteoarthritis, unspecified site: Secondary | ICD-10-CM | POA: Diagnosis not present

## 2019-11-14 DIAGNOSIS — M4800 Spinal stenosis, site unspecified: Secondary | ICD-10-CM | POA: Diagnosis not present

## 2019-11-14 DIAGNOSIS — R2689 Other abnormalities of gait and mobility: Secondary | ICD-10-CM | POA: Diagnosis not present

## 2019-11-14 DIAGNOSIS — F0280 Dementia in other diseases classified elsewhere without behavioral disturbance: Secondary | ICD-10-CM | POA: Diagnosis not present

## 2020-01-21 DIAGNOSIS — R21 Rash and other nonspecific skin eruption: Secondary | ICD-10-CM | POA: Diagnosis not present

## 2020-01-21 DIAGNOSIS — Z7401 Bed confinement status: Secondary | ICD-10-CM | POA: Diagnosis not present

## 2020-01-21 DIAGNOSIS — G301 Alzheimer's disease with late onset: Secondary | ICD-10-CM | POA: Diagnosis not present

## 2020-01-23 DIAGNOSIS — Z9049 Acquired absence of other specified parts of digestive tract: Secondary | ICD-10-CM | POA: Diagnosis not present

## 2020-01-23 DIAGNOSIS — M1990 Unspecified osteoarthritis, unspecified site: Secondary | ICD-10-CM | POA: Diagnosis not present

## 2020-01-23 DIAGNOSIS — I119 Hypertensive heart disease without heart failure: Secondary | ICD-10-CM | POA: Diagnosis not present

## 2020-01-23 DIAGNOSIS — M4800 Spinal stenosis, site unspecified: Secondary | ICD-10-CM | POA: Diagnosis not present

## 2020-01-23 DIAGNOSIS — Z7982 Long term (current) use of aspirin: Secondary | ICD-10-CM | POA: Diagnosis not present

## 2020-01-23 DIAGNOSIS — Z90710 Acquired absence of both cervix and uterus: Secondary | ICD-10-CM | POA: Diagnosis not present

## 2020-01-23 DIAGNOSIS — R627 Adult failure to thrive: Secondary | ICD-10-CM | POA: Diagnosis not present

## 2020-01-23 DIAGNOSIS — D563 Thalassemia minor: Secondary | ICD-10-CM | POA: Diagnosis not present

## 2020-01-23 DIAGNOSIS — D696 Thrombocytopenia, unspecified: Secondary | ICD-10-CM | POA: Diagnosis not present

## 2020-01-23 DIAGNOSIS — R7303 Prediabetes: Secondary | ICD-10-CM | POA: Diagnosis not present

## 2020-01-23 DIAGNOSIS — Z993 Dependence on wheelchair: Secondary | ICD-10-CM | POA: Diagnosis not present

## 2020-01-23 DIAGNOSIS — K5790 Diverticulosis of intestine, part unspecified, without perforation or abscess without bleeding: Secondary | ICD-10-CM | POA: Diagnosis not present

## 2020-01-23 DIAGNOSIS — F0280 Dementia in other diseases classified elsewhere without behavioral disturbance: Secondary | ICD-10-CM | POA: Diagnosis not present

## 2020-01-23 DIAGNOSIS — E7800 Pure hypercholesterolemia, unspecified: Secondary | ICD-10-CM | POA: Diagnosis not present

## 2020-01-23 DIAGNOSIS — K219 Gastro-esophageal reflux disease without esophagitis: Secondary | ICD-10-CM | POA: Diagnosis not present

## 2020-01-23 DIAGNOSIS — G301 Alzheimer's disease with late onset: Secondary | ICD-10-CM | POA: Diagnosis not present

## 2020-01-23 DIAGNOSIS — M8580 Other specified disorders of bone density and structure, unspecified site: Secondary | ICD-10-CM | POA: Diagnosis not present

## 2020-01-23 DIAGNOSIS — J309 Allergic rhinitis, unspecified: Secondary | ICD-10-CM | POA: Diagnosis not present

## 2020-01-23 DIAGNOSIS — Z9181 History of falling: Secondary | ICD-10-CM | POA: Diagnosis not present

## 2020-01-27 DIAGNOSIS — G301 Alzheimer's disease with late onset: Secondary | ICD-10-CM | POA: Diagnosis not present

## 2020-01-27 DIAGNOSIS — I119 Hypertensive heart disease without heart failure: Secondary | ICD-10-CM | POA: Diagnosis not present

## 2020-01-27 DIAGNOSIS — F0280 Dementia in other diseases classified elsewhere without behavioral disturbance: Secondary | ICD-10-CM | POA: Diagnosis not present

## 2020-01-27 DIAGNOSIS — M1990 Unspecified osteoarthritis, unspecified site: Secondary | ICD-10-CM | POA: Diagnosis not present

## 2020-01-27 DIAGNOSIS — E7800 Pure hypercholesterolemia, unspecified: Secondary | ICD-10-CM | POA: Diagnosis not present

## 2020-01-27 DIAGNOSIS — R627 Adult failure to thrive: Secondary | ICD-10-CM | POA: Diagnosis not present

## 2020-01-28 DIAGNOSIS — R627 Adult failure to thrive: Secondary | ICD-10-CM | POA: Diagnosis not present

## 2020-01-28 DIAGNOSIS — E7800 Pure hypercholesterolemia, unspecified: Secondary | ICD-10-CM | POA: Diagnosis not present

## 2020-01-28 DIAGNOSIS — M1990 Unspecified osteoarthritis, unspecified site: Secondary | ICD-10-CM | POA: Diagnosis not present

## 2020-01-28 DIAGNOSIS — I119 Hypertensive heart disease without heart failure: Secondary | ICD-10-CM | POA: Diagnosis not present

## 2020-01-28 DIAGNOSIS — G301 Alzheimer's disease with late onset: Secondary | ICD-10-CM | POA: Diagnosis not present

## 2020-01-28 DIAGNOSIS — F0280 Dementia in other diseases classified elsewhere without behavioral disturbance: Secondary | ICD-10-CM | POA: Diagnosis not present

## 2020-01-29 DIAGNOSIS — E7800 Pure hypercholesterolemia, unspecified: Secondary | ICD-10-CM | POA: Diagnosis not present

## 2020-01-29 DIAGNOSIS — R627 Adult failure to thrive: Secondary | ICD-10-CM | POA: Diagnosis not present

## 2020-01-29 DIAGNOSIS — G301 Alzheimer's disease with late onset: Secondary | ICD-10-CM | POA: Diagnosis not present

## 2020-01-29 DIAGNOSIS — I119 Hypertensive heart disease without heart failure: Secondary | ICD-10-CM | POA: Diagnosis not present

## 2020-01-29 DIAGNOSIS — F0280 Dementia in other diseases classified elsewhere without behavioral disturbance: Secondary | ICD-10-CM | POA: Diagnosis not present

## 2020-01-29 DIAGNOSIS — M1990 Unspecified osteoarthritis, unspecified site: Secondary | ICD-10-CM | POA: Diagnosis not present

## 2020-02-06 DIAGNOSIS — I119 Hypertensive heart disease without heart failure: Secondary | ICD-10-CM | POA: Diagnosis not present

## 2020-02-06 DIAGNOSIS — G301 Alzheimer's disease with late onset: Secondary | ICD-10-CM | POA: Diagnosis not present

## 2020-02-06 DIAGNOSIS — M1990 Unspecified osteoarthritis, unspecified site: Secondary | ICD-10-CM | POA: Diagnosis not present

## 2020-02-06 DIAGNOSIS — R627 Adult failure to thrive: Secondary | ICD-10-CM | POA: Diagnosis not present

## 2020-02-06 DIAGNOSIS — F0280 Dementia in other diseases classified elsewhere without behavioral disturbance: Secondary | ICD-10-CM | POA: Diagnosis not present

## 2020-02-06 DIAGNOSIS — E7800 Pure hypercholesterolemia, unspecified: Secondary | ICD-10-CM | POA: Diagnosis not present

## 2020-02-09 DIAGNOSIS — I119 Hypertensive heart disease without heart failure: Secondary | ICD-10-CM | POA: Diagnosis not present

## 2020-02-09 DIAGNOSIS — R627 Adult failure to thrive: Secondary | ICD-10-CM | POA: Diagnosis not present

## 2020-02-09 DIAGNOSIS — G301 Alzheimer's disease with late onset: Secondary | ICD-10-CM | POA: Diagnosis not present

## 2020-02-09 DIAGNOSIS — E7800 Pure hypercholesterolemia, unspecified: Secondary | ICD-10-CM | POA: Diagnosis not present

## 2020-02-09 DIAGNOSIS — F0280 Dementia in other diseases classified elsewhere without behavioral disturbance: Secondary | ICD-10-CM | POA: Diagnosis not present

## 2020-02-09 DIAGNOSIS — M1990 Unspecified osteoarthritis, unspecified site: Secondary | ICD-10-CM | POA: Diagnosis not present

## 2020-02-12 DIAGNOSIS — G301 Alzheimer's disease with late onset: Secondary | ICD-10-CM | POA: Diagnosis not present

## 2020-02-12 DIAGNOSIS — E7800 Pure hypercholesterolemia, unspecified: Secondary | ICD-10-CM | POA: Diagnosis not present

## 2020-02-12 DIAGNOSIS — M1990 Unspecified osteoarthritis, unspecified site: Secondary | ICD-10-CM | POA: Diagnosis not present

## 2020-02-12 DIAGNOSIS — R627 Adult failure to thrive: Secondary | ICD-10-CM | POA: Diagnosis not present

## 2020-02-12 DIAGNOSIS — F0280 Dementia in other diseases classified elsewhere without behavioral disturbance: Secondary | ICD-10-CM | POA: Diagnosis not present

## 2020-02-12 DIAGNOSIS — I119 Hypertensive heart disease without heart failure: Secondary | ICD-10-CM | POA: Diagnosis not present

## 2020-02-17 DIAGNOSIS — G301 Alzheimer's disease with late onset: Secondary | ICD-10-CM | POA: Diagnosis not present

## 2020-02-17 DIAGNOSIS — R627 Adult failure to thrive: Secondary | ICD-10-CM | POA: Diagnosis not present

## 2020-02-17 DIAGNOSIS — I119 Hypertensive heart disease without heart failure: Secondary | ICD-10-CM | POA: Diagnosis not present

## 2020-02-17 DIAGNOSIS — E7800 Pure hypercholesterolemia, unspecified: Secondary | ICD-10-CM | POA: Diagnosis not present

## 2020-02-17 DIAGNOSIS — M1990 Unspecified osteoarthritis, unspecified site: Secondary | ICD-10-CM | POA: Diagnosis not present

## 2020-02-17 DIAGNOSIS — F0280 Dementia in other diseases classified elsewhere without behavioral disturbance: Secondary | ICD-10-CM | POA: Diagnosis not present

## 2020-02-18 DIAGNOSIS — R627 Adult failure to thrive: Secondary | ICD-10-CM | POA: Diagnosis not present

## 2020-02-18 DIAGNOSIS — I119 Hypertensive heart disease without heart failure: Secondary | ICD-10-CM | POA: Diagnosis not present

## 2020-02-18 DIAGNOSIS — M1990 Unspecified osteoarthritis, unspecified site: Secondary | ICD-10-CM | POA: Diagnosis not present

## 2020-02-18 DIAGNOSIS — F0280 Dementia in other diseases classified elsewhere without behavioral disturbance: Secondary | ICD-10-CM | POA: Diagnosis not present

## 2020-02-18 DIAGNOSIS — E7800 Pure hypercholesterolemia, unspecified: Secondary | ICD-10-CM | POA: Diagnosis not present

## 2020-02-18 DIAGNOSIS — G301 Alzheimer's disease with late onset: Secondary | ICD-10-CM | POA: Diagnosis not present

## 2020-02-19 DIAGNOSIS — I119 Hypertensive heart disease without heart failure: Secondary | ICD-10-CM | POA: Diagnosis not present

## 2020-02-19 DIAGNOSIS — E7800 Pure hypercholesterolemia, unspecified: Secondary | ICD-10-CM | POA: Diagnosis not present

## 2020-02-19 DIAGNOSIS — R627 Adult failure to thrive: Secondary | ICD-10-CM | POA: Diagnosis not present

## 2020-02-19 DIAGNOSIS — G301 Alzheimer's disease with late onset: Secondary | ICD-10-CM | POA: Diagnosis not present

## 2020-02-19 DIAGNOSIS — F0280 Dementia in other diseases classified elsewhere without behavioral disturbance: Secondary | ICD-10-CM | POA: Diagnosis not present

## 2020-02-19 DIAGNOSIS — M1990 Unspecified osteoarthritis, unspecified site: Secondary | ICD-10-CM | POA: Diagnosis not present

## 2020-02-22 DIAGNOSIS — R7303 Prediabetes: Secondary | ICD-10-CM | POA: Diagnosis not present

## 2020-02-22 DIAGNOSIS — G301 Alzheimer's disease with late onset: Secondary | ICD-10-CM | POA: Diagnosis not present

## 2020-02-22 DIAGNOSIS — Z9181 History of falling: Secondary | ICD-10-CM | POA: Diagnosis not present

## 2020-02-22 DIAGNOSIS — M1990 Unspecified osteoarthritis, unspecified site: Secondary | ICD-10-CM | POA: Diagnosis not present

## 2020-02-22 DIAGNOSIS — Z7982 Long term (current) use of aspirin: Secondary | ICD-10-CM | POA: Diagnosis not present

## 2020-02-22 DIAGNOSIS — D563 Thalassemia minor: Secondary | ICD-10-CM | POA: Diagnosis not present

## 2020-02-22 DIAGNOSIS — Z993 Dependence on wheelchair: Secondary | ICD-10-CM | POA: Diagnosis not present

## 2020-02-22 DIAGNOSIS — R627 Adult failure to thrive: Secondary | ICD-10-CM | POA: Diagnosis not present

## 2020-02-22 DIAGNOSIS — K5790 Diverticulosis of intestine, part unspecified, without perforation or abscess without bleeding: Secondary | ICD-10-CM | POA: Diagnosis not present

## 2020-02-22 DIAGNOSIS — M8580 Other specified disorders of bone density and structure, unspecified site: Secondary | ICD-10-CM | POA: Diagnosis not present

## 2020-02-22 DIAGNOSIS — J309 Allergic rhinitis, unspecified: Secondary | ICD-10-CM | POA: Diagnosis not present

## 2020-02-22 DIAGNOSIS — E7800 Pure hypercholesterolemia, unspecified: Secondary | ICD-10-CM | POA: Diagnosis not present

## 2020-02-22 DIAGNOSIS — Z9049 Acquired absence of other specified parts of digestive tract: Secondary | ICD-10-CM | POA: Diagnosis not present

## 2020-02-22 DIAGNOSIS — I119 Hypertensive heart disease without heart failure: Secondary | ICD-10-CM | POA: Diagnosis not present

## 2020-02-22 DIAGNOSIS — M4800 Spinal stenosis, site unspecified: Secondary | ICD-10-CM | POA: Diagnosis not present

## 2020-02-22 DIAGNOSIS — F0280 Dementia in other diseases classified elsewhere without behavioral disturbance: Secondary | ICD-10-CM | POA: Diagnosis not present

## 2020-02-22 DIAGNOSIS — D696 Thrombocytopenia, unspecified: Secondary | ICD-10-CM | POA: Diagnosis not present

## 2020-02-22 DIAGNOSIS — Z90710 Acquired absence of both cervix and uterus: Secondary | ICD-10-CM | POA: Diagnosis not present

## 2020-02-22 DIAGNOSIS — K219 Gastro-esophageal reflux disease without esophagitis: Secondary | ICD-10-CM | POA: Diagnosis not present

## 2020-02-25 DIAGNOSIS — M1990 Unspecified osteoarthritis, unspecified site: Secondary | ICD-10-CM | POA: Diagnosis not present

## 2020-02-25 DIAGNOSIS — I119 Hypertensive heart disease without heart failure: Secondary | ICD-10-CM | POA: Diagnosis not present

## 2020-02-25 DIAGNOSIS — G301 Alzheimer's disease with late onset: Secondary | ICD-10-CM | POA: Diagnosis not present

## 2020-02-25 DIAGNOSIS — F0280 Dementia in other diseases classified elsewhere without behavioral disturbance: Secondary | ICD-10-CM | POA: Diagnosis not present

## 2020-02-25 DIAGNOSIS — R627 Adult failure to thrive: Secondary | ICD-10-CM | POA: Diagnosis not present

## 2020-02-25 DIAGNOSIS — E7800 Pure hypercholesterolemia, unspecified: Secondary | ICD-10-CM | POA: Diagnosis not present

## 2020-03-02 DIAGNOSIS — F0280 Dementia in other diseases classified elsewhere without behavioral disturbance: Secondary | ICD-10-CM | POA: Diagnosis not present

## 2020-03-02 DIAGNOSIS — R627 Adult failure to thrive: Secondary | ICD-10-CM | POA: Diagnosis not present

## 2020-03-02 DIAGNOSIS — G301 Alzheimer's disease with late onset: Secondary | ICD-10-CM | POA: Diagnosis not present

## 2020-03-02 DIAGNOSIS — E7800 Pure hypercholesterolemia, unspecified: Secondary | ICD-10-CM | POA: Diagnosis not present

## 2020-03-02 DIAGNOSIS — M1990 Unspecified osteoarthritis, unspecified site: Secondary | ICD-10-CM | POA: Diagnosis not present

## 2020-03-02 DIAGNOSIS — I119 Hypertensive heart disease without heart failure: Secondary | ICD-10-CM | POA: Diagnosis not present

## 2020-03-11 DIAGNOSIS — G301 Alzheimer's disease with late onset: Secondary | ICD-10-CM | POA: Diagnosis not present

## 2020-03-11 DIAGNOSIS — F0280 Dementia in other diseases classified elsewhere without behavioral disturbance: Secondary | ICD-10-CM | POA: Diagnosis not present

## 2020-03-11 DIAGNOSIS — M1990 Unspecified osteoarthritis, unspecified site: Secondary | ICD-10-CM | POA: Diagnosis not present

## 2020-03-11 DIAGNOSIS — I119 Hypertensive heart disease without heart failure: Secondary | ICD-10-CM | POA: Diagnosis not present

## 2020-03-11 DIAGNOSIS — R627 Adult failure to thrive: Secondary | ICD-10-CM | POA: Diagnosis not present

## 2020-03-11 DIAGNOSIS — E7800 Pure hypercholesterolemia, unspecified: Secondary | ICD-10-CM | POA: Diagnosis not present

## 2020-03-16 DIAGNOSIS — G301 Alzheimer's disease with late onset: Secondary | ICD-10-CM | POA: Diagnosis not present

## 2020-03-16 DIAGNOSIS — I119 Hypertensive heart disease without heart failure: Secondary | ICD-10-CM | POA: Diagnosis not present

## 2020-03-16 DIAGNOSIS — E7800 Pure hypercholesterolemia, unspecified: Secondary | ICD-10-CM | POA: Diagnosis not present

## 2020-03-16 DIAGNOSIS — R627 Adult failure to thrive: Secondary | ICD-10-CM | POA: Diagnosis not present

## 2020-03-16 DIAGNOSIS — F0280 Dementia in other diseases classified elsewhere without behavioral disturbance: Secondary | ICD-10-CM | POA: Diagnosis not present

## 2020-03-16 DIAGNOSIS — M1990 Unspecified osteoarthritis, unspecified site: Secondary | ICD-10-CM | POA: Diagnosis not present

## 2020-04-29 ENCOUNTER — Telehealth: Payer: Self-pay

## 2020-04-29 NOTE — Telephone Encounter (Signed)
Attempted to contact patient's daughter Gail to schedule a Palliative Care consult appointment. No answer left a message to return call.  

## 2020-04-30 ENCOUNTER — Telehealth: Payer: Self-pay | Admitting: Student

## 2020-04-30 NOTE — Telephone Encounter (Signed)
Spoke with patient's daughter, Elby Showers regarding the Palliative referral/services and she was in agreement with scheduling visit.  I have scheduled an In-home Consult for 05/12/20 @ 2:30 PM

## 2020-05-12 ENCOUNTER — Other Ambulatory Visit: Payer: Medicare Other | Admitting: Student

## 2020-05-12 ENCOUNTER — Other Ambulatory Visit: Payer: Self-pay

## 2020-05-12 DIAGNOSIS — K219 Gastro-esophageal reflux disease without esophagitis: Secondary | ICD-10-CM | POA: Diagnosis not present

## 2020-05-12 DIAGNOSIS — R52 Pain, unspecified: Secondary | ICD-10-CM

## 2020-05-12 DIAGNOSIS — G301 Alzheimer's disease with late onset: Secondary | ICD-10-CM | POA: Diagnosis not present

## 2020-05-12 DIAGNOSIS — F0281 Dementia in other diseases classified elsewhere with behavioral disturbance: Secondary | ICD-10-CM | POA: Diagnosis not present

## 2020-05-12 DIAGNOSIS — Z515 Encounter for palliative care: Secondary | ICD-10-CM | POA: Diagnosis not present

## 2020-05-12 NOTE — Progress Notes (Signed)
Tara Dixon Consult Note Telephone: [phone]  Fax: [phone]  PATIENT NAME: Tara Dixon [address] [place] [address] [phone] (home) [phone] (work) DOB: 02-03-1932 MRN: [id number]  PRIMARY CARE PROVIDER:    Wenda Low, MD,  [place]. [employer] [address] [place] [address] [phone]  REFERRING PROVIDER:   Wenda Low, MD [place] [employer] [address] [place],  [place] [address] [phone]  RESPONSIBLE PARTY:   Extended Emergency Contact Information Primary Emergency Contact: Tara D. Address: [address] [place] [address] Johnnette Litter of [place] Phone: [phone] Relation: Daughter Secondary Emergency Contact: Tara Dixon Address: [address] [place], [place] [address] Johnnette Litter of [place] Phone: [phone] Relation: Neighbor  I met face to face with patient and family in the home.  ASSESSMENT AND RECOMMENDATIONS:   Advance Care Planning: Visit at the request of Dr. Lysle Dixon for palliative consult. Visit consisted of building trust and discussions on Palliative care medicine as specialized medical care for people living with serious illness, aimed at facilitating improved quality of life through symptoms relief, assisting with advance care planning and establishing goals of care. Family expressed appreciation for education provided on Palliative care and how it differs from Hospice service. Palliative care will continue to provide support to patient, family and the medical team. Ongoing assessment for evaluation of Hospice eligibility; will refer when she meets criteria.   Goal of care: To remain comfortable.   Directives: MOST form introduced; will complete on next visit.   Symptom Management:   Alzheimer's Dementia- FAST scale 6E.  Requires assistance with all adl's. She does have agitation, sun downs. Recommend  olanzapine 2.36m each evening. Continue lorazepam 137mml prn topically every 4 to 6 hours prn. Encouraged to try applying lorazepam at least 30 minutes prior to attempting adl/incontinent care. Continue to redirect/reorient as needed.   Pain-patient with OA, generalized pain. Recommend acetaminophen 100062mID.   GERD-recommend continuing pantoprazole 64m108mily. Education provided on triggers. Encouraged to sit up head of bed elevated for at least 30 minutes after eating/drinking.   Follow up Palliative Care Visit: Palliative care will continue to follow for complex decision making and symptom management. Return in 4 weeks or prn.  Family /Caregiver/Community Supports: Palliative Medicine will continue to provide support. Continue private caregivers. Referral made to Palliative SW regarding long range planning. Discussed additional care in the home vs. Placement.   I spent 60 minutes providing this consultation, from 1:45 pm to 2:45pm. Time includes time spent with patient/family, chart review, provider coordination, and documentation. More than 50% of the time in this consultation was spent counseling and coordinating communication.   CHIEF COMPLAINT: Palliative Medicine initial consult; dementia.  History obtained from review of EMR, discussion with primary team, and  interview with family. Records reviewed and summarized below.  HISTORY OF PRESENT ILLNESS:  Tara Dixon 88 y50. year old female with multiple medical problems including Alzheimer's dementia, GERD, hypertension, hyperlipidemia, OA, allergic rhinitis. Palliative Care was asked to follow this patient by consultation request of HusaWenda Dixon to help address advance care planning and goals of care. This is an initial visit.  Patient has had a slow decline over the past two years. Daughter states patient communication fluctuates, sometimes her speech is clear, other times difficult to understand. She "sun downs," has  increased agitation in  the evenings. Unable to tolerate the Aricept, no longer taking Namenda. Daughter reports a 25 pound loss in the past two years. She does endorse patient eating two good meals a day; she will not always eat a dinner meal due to increased agitation in the evenings. Bed bound now since October/November. Had therapy mid July, was able to stand and pivot up until then. Rarely out of bed now, mechanical lift in the home. Completely incontinent of bowel and bladder x two years. No recent falls or injury. Daughter is here to help care for her mother, lives in [place] and plans to return by the summer. Patient does have a private caregiver that comes in intermittently throughout the week.    CODE STATUS: Full Code  PPS: 30%  HOSPICE ELIGIBILITY/DIAGNOSIS: TBD  ROS   General: NAD EYES: denies vision changes ENMT: denies dysphagia Cardiovascular: denies chest pain Pulmonary: occasional non-productive cough, denies increased SOB Abdomen: endorses fair appetite, denies constipation, incontinent of bowel GU: denies dysuria, incontinent of urine MSK:  endorses ROM limitations, no falls reported, endorses generalized pain to joints Skin: denies rashes or wounds Neurological: endorses weakness Psych: Endorses positive mood, agitation, combative at times Heme/lymph/immuno: denies bruises, abnormal bleeding   Physical Exam: Constitutional: NAD General: A & O to person, familiars, frail appearing EYES: anicteric sclera, lids intact, no discharge  ENMT: intact hearing,oral mucous membranes moist CV: RRR, trace LE edema Pulmonary: LCTA, no increased work of breathing, no cough, room air Abdomen: BS normoactive x 4 GU: deferred MSK: severe sarcopenia, decreased ROM in all extremities, non ambulatory Skin: warm and dry, no rashes or wounds on visible skin Neuro: Generalized weakness, severe cognitive impairment Psych: non-anxious affect today Hem/lymph/immuno: no widespread  bruising   PAST MEDICAL HISTORY:  Past Medical History:  Diagnosis Date  . Allergy   . Alzheimer disease ([place])   . Anemia   . Arthritis   . Hypertension     SOCIAL HX:  Social History   Tobacco Use  . Smoking status: Never Smoker  . Smokeless tobacco: Never Used  Substance Use Topics  . Alcohol use: Not on file   FAMILY HX:  Family History  Problem Relation Age of Onset  . Hypertension Mother   . Stroke Mother   . Hypertension Father   . Stroke Father   . Hypertension Sister     ALLERGIES:  Allergies  Allergen Reactions  . Ampicillin   . Bactrim [Sulfamethoxazole-Trimethoprim]   . Celebrex [Celecoxib]   . Polysporin [Bacitracin-Polymyxin B]      PERTINENT MEDICATIONS:  Outpatient Encounter Medications as of 05/12/2020  Medication Sig  . amLODipine (NORVASC) 5 MG tablet Take 5 mg by mouth daily.  . Calcium Carb-Cholecalciferol (CALCIUM + D3 PO) Take by mouth.  . cephALEXin (KEFLEX) 500 MG capsule Take 1 capsule (500 mg total) by mouth 4 (four) times daily.  . ciprofloxacin (CIPRO) 250 MG tablet Take 1 tablet (250 mg total) by mouth every 12 (twelve) hours.  . folic acid (FOLVITE) 1 MG tablet TAKE 2 TABLETS(2 MG) BY MOUTH DAILY  . hydrochlorothiazide (MICROZIDE) 12.5 MG capsule Take 12.5 mg by mouth daily.  . memantine (NAMENDA XR) 28 MG CP24 24 hr capsule Take 28 mg by mouth.  . Multiple Vitamins-Minerals (CENTRUM ADULTS PO) Take by mouth.  . Omega-3 Fatty Acids (OMEGA 3 PO) Take by mouth.   No facility-administered encounter medications on file as of 05/12/2020.     Thank you for the opportunity to participate in the care  of Ms. Shon Baton. The palliative care team will continue to follow. Please call our office at [phone] if we can be of additional assistance.  Ezekiel Slocumb, NP

## 2020-05-14 ENCOUNTER — Telehealth: Payer: Self-pay

## 2020-05-14 NOTE — Telephone Encounter (Signed)
At direction of Palliative NP, message sent to PCP office with  recommendation of olanzapine 2.5mg  each evening due to worsening agitation.

## 2020-05-24 ENCOUNTER — Telehealth: Payer: Self-pay

## 2020-05-24 NOTE — Telephone Encounter (Signed)
Received message that patient's daughter called requesting a call back regarding medications. Phone call placed. VM left

## 2020-05-25 ENCOUNTER — Telehealth: Payer: Self-pay

## 2020-05-25 NOTE — Telephone Encounter (Signed)
Received return call from Dr. Sherilyn Cooter nurse, Elmyra Ricks. Dr. Deforest Hoyles in agreement with NP's recommendations. New prescription sent to patient's pharmacy.

## 2020-05-25 NOTE — Telephone Encounter (Signed)
Patient's daughter updated about new prescription sent to pharmacy.

## 2020-05-25 NOTE — Telephone Encounter (Signed)
Phone call placed to Dr. Sherilyn Cooter office. Left message for nurse Elmyra Ricks with Palliative NP recommendations for olanzapine 2.5mg  in the evening due to worsening agitation. Call back information provided.

## 2020-05-25 NOTE — Telephone Encounter (Signed)
Received message from patient's daughter that she had not heard back about medication that Palliative NP recommended. Will follow up

## 2020-06-16 ENCOUNTER — Other Ambulatory Visit: Payer: Self-pay

## 2020-06-16 ENCOUNTER — Other Ambulatory Visit: Payer: Medicare Other | Admitting: Student

## 2020-06-16 DIAGNOSIS — G301 Alzheimer's disease with late onset: Secondary | ICD-10-CM | POA: Diagnosis not present

## 2020-06-16 DIAGNOSIS — F0281 Dementia in other diseases classified elsewhere with behavioral disturbance: Secondary | ICD-10-CM | POA: Diagnosis not present

## 2020-06-16 DIAGNOSIS — Z515 Encounter for palliative care: Secondary | ICD-10-CM | POA: Diagnosis not present

## 2020-06-16 DIAGNOSIS — M159 Polyosteoarthritis, unspecified: Secondary | ICD-10-CM

## 2020-06-16 DIAGNOSIS — R531 Weakness: Secondary | ICD-10-CM | POA: Diagnosis not present

## 2020-06-16 DIAGNOSIS — F02818 Dementia in other diseases classified elsewhere, unspecified severity, with other behavioral disturbance: Secondary | ICD-10-CM

## 2020-06-16 NOTE — Progress Notes (Signed)
[facility] Consult Note Telephone: [phone]  Fax: [phone]    Date of encounter: 06/16/20 PATIENT NAME: Tara Dixon [address] [place] [address]   [phone] (home) [phone] (work) DOB: 1931/03/29 MRN: [id number] PRIMARY CARE PROVIDER:    Wenda Low, MD,  [place]. [employer] [address] [place] [address] [phone]  REFERRING PROVIDER:   Wenda Low, MD [place] [employer] [address] [place],  [place] [address] [phone]  RESPONSIBLE PARTY:    Contact Information    Name Relation Home Work Daphne D. Daughter [phone]     [name] And Omelia Blackwater [phone]        I met face to face with patient and family in the home. Palliative Care was asked to follow this patient by consultation request of  Wenda Low, MD to address advance care planning and complex medical decision making. This is a follow up visit.                                   ASSESSMENT AND PLAN / RECOMMENDATIONS:   Advance Care Planning/Goals of Care: Goals include to maximize quality of life and symptom management. Our advance care planning conversation included a discussion about:     The value and importance of advance care planning   Experiences with loved ones who have been seriously ill or have died   Exploration of personal, cultural or spiritual beliefs that might influence medical decisions   Exploration of goals of care in the event of a sudden injury or illness   Identification and preparation of a healthcare agent   Will complete MOST form on next visit  Decision not to resuscitate or to de-escalate disease focused treatments due to poor prognosis.  CODE STATUS: Full Code  Symptom Management/Plan:  Alzheimer's Dementia- FAST scale 6E.  Requires assistance with all adl's. Agitation managed better in the past month. Continue olanzapine 2.90m each evening. Continue  lorazepam 115mml prn topically every 4 to 6 hours prn. Encouraged to try applying lorazepam at least 30 minutes prior to attempting adl/incontinent care. Continue to redirect/reorient as needed. Monitor for falls/ safety.   Impaired mobility/generalized weakness secondary to Alzheimer's dementia-continue assisting with all adl's. May perform gentle ROM to upper and lower extremities as able to tolerate. Use pillows in between feet/knees for positioning. Assist out of bed with hoyer lift as tolerated.   Pain-patient with OA, generalized pain. Recommend diclofenac gel 1% apply 2 gm to bilateral knees TID PRN; E-script sent. May use acetaminophen 10008mO BID.   Follow up Palliative Care Visit: Palliative care will continue to follow for complex medical decision making, advance care planning, and clarification of goals. Return in 4 weeks or prn.   I spent 40 minutes providing this consultation. Time includes time spent with patient/family, chart review, provider coordination, and documentation. More than 50% of the time in this consultation was spent counseling and coordinating communication.    PPS: 30%  HOSPICE ELIGIBILITY/DIAGNOSIS: TBD  Chief Complaint: Palliative medicine follow up visit; dementia.  HISTORY OF PRESENT ILLNESS:  Tara Dixon a [age]o. year old female  with Alzheimer's dementia. Diagnoses also include GERD, hypertension, hyperlipidemia, OA, allergic rhinitis.  Daughter GayEdd Fabianports patient doing okay the past month. She has used the olanzapine a couple of times with effectiveness; has used the lorazepam gel sparingly. Patient bed bound; awaiting a  new hospital bed. Has a hoyer lift in the home. She is dependent for all adl's. She has left foot drop. Good appetite reported. Sleeping well. Patient does report bilateral knee pain. Some difficulty getting her to take the liquid tylenol.   History obtained from review of EMR, discussion with primary team, and interview with  family, facility staff/caregiver and/or Ms. Tara Dixon.  I reviewed available labs, medications, imaging, studies and related documents from the EMR.  Records reviewed and summarized above.   ROS  General: NAD EYES: denies vision changes ENMT: denies dysphagia Cardiovascular: denies chest pain Pulmonary: denies cough, denies increased SOB Abdomen: endorses good appetite, denies constipation,  incontinence of bowel GU: denies dysuria, incontinence of urine MSK: weakness, no falls reported Skin: denies rashes or wounds Neurological: denies insomnia Psych: agitation at times Heme/lymph/immuno: denies bruises, abnormal bleeding  Physical Exam: Pulse 72, resp 16, b/p 120/70, sats 98% on room air Constitutional: NAD General: frail appearing  EYES: anicteric sclera, lids intact, no discharge  ENMT: intact hearing, oral mucous membranes moist, dentition intact CV: S1S2, RRR, trace LE edema Pulmonary: LCTA, no increased work of breathing, no cough Abdomen: normo-active BS + 4 quadrants, soft and non tender GU: deferred MSK: decreased ROM in all extremities, non- nambulatory Skin: warm and dry, no rashes or wounds on visible skin Neuro: generalized weakness, cognitive impairment Psych: pleasant, non-anxious affect Hem/lymph/immuno: no widespread bruising   Thank you for the opportunity to participate in the care of Ms. Tara Dixon.  The palliative care team will continue to follow. Please call our office at [phone] if we can be of additional assistance.   Ezekiel Slocumb, NP   COVID-19 PATIENT SCREENING TOOL Asked and negative response unless otherwise noted:   Have you had symptoms of covid, tested positive or been in contact with someone with symptoms/positive test in the past 5-10 days? No

## 2020-07-16 ENCOUNTER — Other Ambulatory Visit: Payer: Medicare Other | Admitting: Student

## 2020-07-16 ENCOUNTER — Other Ambulatory Visit: Payer: Self-pay

## 2020-07-27 DIAGNOSIS — R051 Acute cough: Secondary | ICD-10-CM | POA: Diagnosis not present

## 2020-07-27 DIAGNOSIS — Z20822 Contact with and (suspected) exposure to covid-19: Secondary | ICD-10-CM | POA: Diagnosis not present

## 2020-08-04 ENCOUNTER — Telehealth: Payer: Self-pay

## 2020-08-04 NOTE — Telephone Encounter (Signed)
Phone call placed to PCP office to provide update of conversation with daughter. VM left

## 2020-08-04 NOTE — Telephone Encounter (Signed)
Spoke with patient's daughter and made her aware of PCP response to continue to stay well hydrated and to use Delsym cough syrup and stay out of the heat. Daughter to call back if patient worsens or cough continues.

## 2020-08-04 NOTE — Telephone Encounter (Signed)
Received message to call patient's daughter. Spoke with daughter who shared that patient tested positive for COVID 10 days ago. Patient has been recovering but continues to have productive cough with cream color mucus. Daughter reports that patient does not have any shortness of breath, afebrile, drinking fluids and does not appear ill with the exception of cough. Daughter expressed concerns that patient may develop PNA. Will f/u with Provider

## 2020-08-17 DIAGNOSIS — L8990 Pressure ulcer of unspecified site, unspecified stage: Secondary | ICD-10-CM | POA: Diagnosis not present

## 2020-08-18 ENCOUNTER — Other Ambulatory Visit: Payer: Medicare Other | Admitting: Student

## 2020-08-18 ENCOUNTER — Other Ambulatory Visit: Payer: Self-pay

## 2020-08-18 DIAGNOSIS — S31000D Unspecified open wound of lower back and pelvis without penetration into retroperitoneum, subsequent encounter: Secondary | ICD-10-CM | POA: Diagnosis not present

## 2020-08-18 DIAGNOSIS — F0281 Dementia in other diseases classified elsewhere with behavioral disturbance: Secondary | ICD-10-CM | POA: Diagnosis not present

## 2020-08-18 DIAGNOSIS — F02818 Dementia in other diseases classified elsewhere, unspecified severity, with other behavioral disturbance: Secondary | ICD-10-CM

## 2020-08-18 DIAGNOSIS — G301 Alzheimer's disease with late onset: Secondary | ICD-10-CM

## 2020-08-18 DIAGNOSIS — Z515 Encounter for palliative care: Secondary | ICD-10-CM | POA: Diagnosis not present

## 2020-08-18 DIAGNOSIS — R451 Restlessness and agitation: Secondary | ICD-10-CM

## 2020-08-18 DIAGNOSIS — R52 Pain, unspecified: Secondary | ICD-10-CM | POA: Diagnosis not present

## 2020-08-18 NOTE — Progress Notes (Signed)
[facility] Consult Note Telephone: [phone]  Fax: [phone]    Date of encounter: 08/18/20 PATIENT NAME: Tara Dixon [address] [place] [address]   [phone] (home) [phone] (work) DOB: Feb 01, 1932 MRN: [id number] PRIMARY CARE PROVIDER:    Wenda Low, MD,  [place]. [employer] [place] [address] [place] [address] [phone]  REFERRING PROVIDER:   Wenda Low, MD [place] [employer] [place] [address],  [place] [address] [phone]  RESPONSIBLE PARTY:    Contact Information     Name Relation Home Work [place] D. Daughter [phone]     [name] And Omelia [place] [phone]          I met face to face with patient and family in the home. Palliative Care was asked to follow this patient by consultation request of  Wenda Low, MD to address advance care planning and complex medical decision making. This is a follow up visit.                                   ASSESSMENT AND PLAN / RECOMMENDATIONS:   Advance Care Planning/Goals of Care: Goals include to maximize quality of life and symptom management. Our advance care planning conversation included a discussion about:    The value and importance of advance care planning  Experiences with loved ones who have been seriously ill or have died  Exploration of personal, cultural or spiritual beliefs that might influence medical decisions  Exploration of goals of care in the event of a sudden injury or illness  CODE STATUS: Full Code  Symptom Management/Plan:  Alzheimer's Dementia- FAST scale 6E. Requires assistance with all adl's. Agitation varies; daughter has been giving olanzapine PRN. Recommend administered 2.65m routinely each evening. May use  lorazepam 115mml prn topically every 4 to 6 hours prn. Encouraged to try applying lorazepam at least 30 minutes prior to attempting adl/incontinent care. Continue to  redirect/reorient as needed. Monitor for falls/ safety. Appetite usually fair to good. Recommend Boost nutritional supplement if patient declines a meal or eats less than 50% of a meal.   Pain-patient with OA, generalized pain. Recommend diclofenac gel 1% apply 2 gm to bilateral knees TID PRN. May use acetaminophen 100046mO BID.  Stage 2 sacral wound- wound measures 1.2cm x 2.5cm. wound bed is 100% red; no drainage or odor. Recommend daily multivitamin, applying zinc oxide maximum strength BID. Turn and reposition every 2 hours. Air mattress on bed. Education provided on signs of infection, worsening of wound. Continue antibiotic, doxycycline ordered per PCP.     Follow up Palliative Care Visit: Palliative care will continue to follow for complex medical decision making, advance care planning, and clarification of goals. Return in 8 weeks or prn.  I spent 60 minutes providing this consultation. More than 50% of the time in this consultation was spent in counseling and care coordination.   PPS: 30%  HOSPICE ELIGIBILITY/DIAGNOSIS: TBD  Chief Complaint: Palliative Medicine follow up visit.   HISTORY OF PRESENT ILLNESS:  Tara Dixon a [age]o. year old female  with  Alzheimer's dementia. Diagnoses also include GERD, hypertension, hyperlipidemia, OA, allergic rhinitis.  Daughter GayEdd Fabianports she and patient had Covid infection in past month and were not able to go out of town as planned. She reports patient being near baseline aside from new wound that was observed in the past week. She  denies any fever, chills. Occasional cough. Patient's appetite varies from fair to good; she usually eats two good meals a day. Agitation varies; she has been giving the olanzapine PRN, currently not using the lorazepam gel. Patient will decline to take her medications at times; they are being crushed. Patient had tele health visit with PCP yesterday; prescribed doxycycline for wound.  History obtained from  review of EMR, discussion with primary team, and interview with family, facility staff/caregiver and/or Tara Dixon.  I reviewed available labs, medications, imaging, studies and related documents from the EMR.  Records reviewed and summarized above.   ROS  General: NAD EYES: denies vision changes ENMT: denies dysphagia Cardiovascular: denies chest pain, Pulmonary: occasional cough, denies increased SOB Abdomen: denies constipation, incontinence of bowel GU: denies dysuria, incontinence of urine MSK: weakness, no falls reported Skin: new wound to buttocks Neurological: denies pain Psych: Endorses stable mood Heme/lymph/immuno: denies bruises, abnormal bleeding  Physical Exam: Pulse 64, resp 16, b/p 122/70, sats 98% on room air Constitutional: NAD General: frail appearing EYES: anicteric sclera, lids intact, no discharge  ENMT: intact hearing, oral mucous membranes moist CV: S1S2, RRR, no LE edema Pulmonary: LCTA, no increased work of breathing, no cough Abdomen: normo-active BS + 4 quadrants, soft and non tender GU: deferred MSK: decreased ROM in all extremities, non-ambulatory Skin: warm and dry, stage 2 to left sacral region. Wound measures 1.2cm x 2.5cm. wound bed 100% red; no drainage. Neuro: generalized weakness, cognitive impairment Psych: non-anxious affect, pleasant Hem/lymph/immuno: no widespread bruising   Thank you for the opportunity to participate in the care of Tara Dixon.  The palliative care team will continue to follow. Please call our office at [phone] if we can be of additional assistance.   Ezekiel Slocumb, NP   COVID-19 PATIENT SCREENING TOOL Asked and negative response unless otherwise noted:   Have you had symptoms of covid, tested positive or been in contact with someone with symptoms/positive test in the past 5-10 days? No

## 2020-09-10 ENCOUNTER — Telehealth: Payer: Self-pay

## 2020-09-10 NOTE — Telephone Encounter (Signed)
Received VM from daughter re: concerns of increase in patient's behaviors and confusion. Daughter noted that patient has been more combative and at times appears frightened. Daughter shared that she was able to give the olanzapine 2.'5mg'$  last night and patient does appear calmer today. It is difficult to administer tablets to patient. Patient also has Ativan gel in the home but daughter noted that this did not help at all. Per daughter report, no other evidence of physical pain and no further evidence of urinary tract infection. Daughter inquired if there is something that patient can be prescribed that is in liquid form.

## 2020-09-10 NOTE — Telephone Encounter (Signed)
Phone call placed to PCP office to provide update. Dr. Deforest Hoyles is out of the office. Message left with medical assistant. Will F/U with Palliative care providers

## 2020-09-15 ENCOUNTER — Telehealth: Payer: Self-pay | Admitting: Student

## 2020-09-15 NOTE — Telephone Encounter (Signed)
Palliative NP returned call to patient's daughter [name]. She states she received a partial fill of 15 tablets of the olanzapine. The insurance company is requesting additional information to fill/make changes to prescription. Message forwarded to Dr. Mariea Clonts.

## 2020-09-24 DIAGNOSIS — Z8616 Personal history of COVID-19: Secondary | ICD-10-CM | POA: Diagnosis not present

## 2020-09-24 DIAGNOSIS — K219 Gastro-esophageal reflux disease without esophagitis: Secondary | ICD-10-CM | POA: Diagnosis not present

## 2020-09-24 DIAGNOSIS — F0281 Dementia in other diseases classified elsewhere with behavioral disturbance: Secondary | ICD-10-CM | POA: Diagnosis not present

## 2020-09-24 DIAGNOSIS — Z9181 History of falling: Secondary | ICD-10-CM | POA: Diagnosis not present

## 2020-09-24 DIAGNOSIS — G301 Alzheimer's disease with late onset: Secondary | ICD-10-CM | POA: Diagnosis not present

## 2020-09-24 DIAGNOSIS — Z872 Personal history of diseases of the skin and subcutaneous tissue: Secondary | ICD-10-CM | POA: Diagnosis not present

## 2020-09-24 DIAGNOSIS — M1990 Unspecified osteoarthritis, unspecified site: Secondary | ICD-10-CM | POA: Diagnosis not present

## 2020-09-24 DIAGNOSIS — I10 Essential (primary) hypertension: Secondary | ICD-10-CM | POA: Diagnosis not present

## 2020-09-24 DIAGNOSIS — Z87440 Personal history of urinary (tract) infections: Secondary | ICD-10-CM | POA: Diagnosis not present

## 2020-09-24 DIAGNOSIS — D649 Anemia, unspecified: Secondary | ICD-10-CM | POA: Diagnosis not present

## 2020-09-25 DIAGNOSIS — Z8616 Personal history of COVID-19: Secondary | ICD-10-CM | POA: Diagnosis not present

## 2020-09-25 DIAGNOSIS — G301 Alzheimer's disease with late onset: Secondary | ICD-10-CM | POA: Diagnosis not present

## 2020-09-25 DIAGNOSIS — F0281 Dementia in other diseases classified elsewhere with behavioral disturbance: Secondary | ICD-10-CM | POA: Diagnosis not present

## 2020-09-25 DIAGNOSIS — Z9181 History of falling: Secondary | ICD-10-CM | POA: Diagnosis not present

## 2020-09-25 DIAGNOSIS — Z87440 Personal history of urinary (tract) infections: Secondary | ICD-10-CM | POA: Diagnosis not present

## 2020-09-25 DIAGNOSIS — Z872 Personal history of diseases of the skin and subcutaneous tissue: Secondary | ICD-10-CM | POA: Diagnosis not present

## 2020-09-28 DIAGNOSIS — F0281 Dementia in other diseases classified elsewhere with behavioral disturbance: Secondary | ICD-10-CM | POA: Diagnosis not present

## 2020-09-28 DIAGNOSIS — Z9181 History of falling: Secondary | ICD-10-CM | POA: Diagnosis not present

## 2020-09-28 DIAGNOSIS — Z87440 Personal history of urinary (tract) infections: Secondary | ICD-10-CM | POA: Diagnosis not present

## 2020-09-28 DIAGNOSIS — G301 Alzheimer's disease with late onset: Secondary | ICD-10-CM | POA: Diagnosis not present

## 2020-09-28 DIAGNOSIS — Z8616 Personal history of COVID-19: Secondary | ICD-10-CM | POA: Diagnosis not present

## 2020-09-28 DIAGNOSIS — Z872 Personal history of diseases of the skin and subcutaneous tissue: Secondary | ICD-10-CM | POA: Diagnosis not present

## 2020-09-30 DIAGNOSIS — Z87440 Personal history of urinary (tract) infections: Secondary | ICD-10-CM | POA: Diagnosis not present

## 2020-09-30 DIAGNOSIS — G301 Alzheimer's disease with late onset: Secondary | ICD-10-CM | POA: Diagnosis not present

## 2020-09-30 DIAGNOSIS — Z8616 Personal history of COVID-19: Secondary | ICD-10-CM | POA: Diagnosis not present

## 2020-09-30 DIAGNOSIS — F0281 Dementia in other diseases classified elsewhere with behavioral disturbance: Secondary | ICD-10-CM | POA: Diagnosis not present

## 2020-09-30 DIAGNOSIS — Z872 Personal history of diseases of the skin and subcutaneous tissue: Secondary | ICD-10-CM | POA: Diagnosis not present

## 2020-09-30 DIAGNOSIS — Z9181 History of falling: Secondary | ICD-10-CM | POA: Diagnosis not present

## 2020-10-04 DIAGNOSIS — Z872 Personal history of diseases of the skin and subcutaneous tissue: Secondary | ICD-10-CM | POA: Diagnosis not present

## 2020-10-04 DIAGNOSIS — F0281 Dementia in other diseases classified elsewhere with behavioral disturbance: Secondary | ICD-10-CM | POA: Diagnosis not present

## 2020-10-04 DIAGNOSIS — Z87440 Personal history of urinary (tract) infections: Secondary | ICD-10-CM | POA: Diagnosis not present

## 2020-10-04 DIAGNOSIS — Z9181 History of falling: Secondary | ICD-10-CM | POA: Diagnosis not present

## 2020-10-04 DIAGNOSIS — G301 Alzheimer's disease with late onset: Secondary | ICD-10-CM | POA: Diagnosis not present

## 2020-10-04 DIAGNOSIS — Z8616 Personal history of COVID-19: Secondary | ICD-10-CM | POA: Diagnosis not present

## 2020-10-06 DIAGNOSIS — Z872 Personal history of diseases of the skin and subcutaneous tissue: Secondary | ICD-10-CM | POA: Diagnosis not present

## 2020-10-06 DIAGNOSIS — Z9181 History of falling: Secondary | ICD-10-CM | POA: Diagnosis not present

## 2020-10-06 DIAGNOSIS — Z8616 Personal history of COVID-19: Secondary | ICD-10-CM | POA: Diagnosis not present

## 2020-10-06 DIAGNOSIS — G301 Alzheimer's disease with late onset: Secondary | ICD-10-CM | POA: Diagnosis not present

## 2020-10-06 DIAGNOSIS — Z87440 Personal history of urinary (tract) infections: Secondary | ICD-10-CM | POA: Diagnosis not present

## 2020-10-06 DIAGNOSIS — F0281 Dementia in other diseases classified elsewhere with behavioral disturbance: Secondary | ICD-10-CM | POA: Diagnosis not present

## 2020-10-07 DIAGNOSIS — F0281 Dementia in other diseases classified elsewhere with behavioral disturbance: Secondary | ICD-10-CM | POA: Diagnosis not present

## 2020-10-07 DIAGNOSIS — G301 Alzheimer's disease with late onset: Secondary | ICD-10-CM | POA: Diagnosis not present

## 2020-10-07 DIAGNOSIS — Z87440 Personal history of urinary (tract) infections: Secondary | ICD-10-CM | POA: Diagnosis not present

## 2020-10-07 DIAGNOSIS — Z8616 Personal history of COVID-19: Secondary | ICD-10-CM | POA: Diagnosis not present

## 2020-10-07 DIAGNOSIS — Z9181 History of falling: Secondary | ICD-10-CM | POA: Diagnosis not present

## 2020-10-07 DIAGNOSIS — Z872 Personal history of diseases of the skin and subcutaneous tissue: Secondary | ICD-10-CM | POA: Diagnosis not present

## 2020-10-11 DIAGNOSIS — F0281 Dementia in other diseases classified elsewhere with behavioral disturbance: Secondary | ICD-10-CM | POA: Diagnosis not present

## 2020-10-11 DIAGNOSIS — G301 Alzheimer's disease with late onset: Secondary | ICD-10-CM | POA: Diagnosis not present

## 2020-10-11 DIAGNOSIS — Z872 Personal history of diseases of the skin and subcutaneous tissue: Secondary | ICD-10-CM | POA: Diagnosis not present

## 2020-10-11 DIAGNOSIS — Z87440 Personal history of urinary (tract) infections: Secondary | ICD-10-CM | POA: Diagnosis not present

## 2020-10-11 DIAGNOSIS — Z8616 Personal history of COVID-19: Secondary | ICD-10-CM | POA: Diagnosis not present

## 2020-10-11 DIAGNOSIS — Z9181 History of falling: Secondary | ICD-10-CM | POA: Diagnosis not present

## 2020-10-13 DIAGNOSIS — F0281 Dementia in other diseases classified elsewhere with behavioral disturbance: Secondary | ICD-10-CM | POA: Diagnosis not present

## 2020-10-13 DIAGNOSIS — Z9181 History of falling: Secondary | ICD-10-CM | POA: Diagnosis not present

## 2020-10-13 DIAGNOSIS — G301 Alzheimer's disease with late onset: Secondary | ICD-10-CM | POA: Diagnosis not present

## 2020-10-13 DIAGNOSIS — Z872 Personal history of diseases of the skin and subcutaneous tissue: Secondary | ICD-10-CM | POA: Diagnosis not present

## 2020-10-13 DIAGNOSIS — Z87440 Personal history of urinary (tract) infections: Secondary | ICD-10-CM | POA: Diagnosis not present

## 2020-10-13 DIAGNOSIS — Z8616 Personal history of COVID-19: Secondary | ICD-10-CM | POA: Diagnosis not present

## 2020-10-14 DIAGNOSIS — Z9181 History of falling: Secondary | ICD-10-CM | POA: Diagnosis not present

## 2020-10-14 DIAGNOSIS — Z87440 Personal history of urinary (tract) infections: Secondary | ICD-10-CM | POA: Diagnosis not present

## 2020-10-14 DIAGNOSIS — F0281 Dementia in other diseases classified elsewhere with behavioral disturbance: Secondary | ICD-10-CM | POA: Diagnosis not present

## 2020-10-14 DIAGNOSIS — G301 Alzheimer's disease with late onset: Secondary | ICD-10-CM | POA: Diagnosis not present

## 2020-10-14 DIAGNOSIS — Z8616 Personal history of COVID-19: Secondary | ICD-10-CM | POA: Diagnosis not present

## 2020-10-14 DIAGNOSIS — Z872 Personal history of diseases of the skin and subcutaneous tissue: Secondary | ICD-10-CM | POA: Diagnosis not present

## 2020-10-15 DIAGNOSIS — G301 Alzheimer's disease with late onset: Secondary | ICD-10-CM | POA: Diagnosis not present

## 2020-10-15 DIAGNOSIS — Z872 Personal history of diseases of the skin and subcutaneous tissue: Secondary | ICD-10-CM | POA: Diagnosis not present

## 2020-10-15 DIAGNOSIS — F0281 Dementia in other diseases classified elsewhere with behavioral disturbance: Secondary | ICD-10-CM | POA: Diagnosis not present

## 2020-10-15 DIAGNOSIS — Z87440 Personal history of urinary (tract) infections: Secondary | ICD-10-CM | POA: Diagnosis not present

## 2020-10-15 DIAGNOSIS — Z9181 History of falling: Secondary | ICD-10-CM | POA: Diagnosis not present

## 2020-10-15 DIAGNOSIS — Z8616 Personal history of COVID-19: Secondary | ICD-10-CM | POA: Diagnosis not present

## 2020-10-18 DIAGNOSIS — Z8616 Personal history of COVID-19: Secondary | ICD-10-CM | POA: Diagnosis not present

## 2020-10-18 DIAGNOSIS — F0281 Dementia in other diseases classified elsewhere with behavioral disturbance: Secondary | ICD-10-CM | POA: Diagnosis not present

## 2020-10-18 DIAGNOSIS — G301 Alzheimer's disease with late onset: Secondary | ICD-10-CM | POA: Diagnosis not present

## 2020-10-18 DIAGNOSIS — Z872 Personal history of diseases of the skin and subcutaneous tissue: Secondary | ICD-10-CM | POA: Diagnosis not present

## 2020-10-18 DIAGNOSIS — Z9181 History of falling: Secondary | ICD-10-CM | POA: Diagnosis not present

## 2020-10-18 DIAGNOSIS — Z87440 Personal history of urinary (tract) infections: Secondary | ICD-10-CM | POA: Diagnosis not present

## 2020-10-21 DIAGNOSIS — M1990 Unspecified osteoarthritis, unspecified site: Secondary | ICD-10-CM | POA: Diagnosis not present

## 2020-10-21 DIAGNOSIS — I10 Essential (primary) hypertension: Secondary | ICD-10-CM | POA: Diagnosis not present

## 2020-10-21 DIAGNOSIS — K219 Gastro-esophageal reflux disease without esophagitis: Secondary | ICD-10-CM | POA: Diagnosis not present

## 2020-10-21 DIAGNOSIS — Z9181 History of falling: Secondary | ICD-10-CM | POA: Diagnosis not present

## 2020-10-21 DIAGNOSIS — Z87440 Personal history of urinary (tract) infections: Secondary | ICD-10-CM | POA: Diagnosis not present

## 2020-10-21 DIAGNOSIS — G301 Alzheimer's disease with late onset: Secondary | ICD-10-CM | POA: Diagnosis not present

## 2020-10-21 DIAGNOSIS — Z8616 Personal history of COVID-19: Secondary | ICD-10-CM | POA: Diagnosis not present

## 2020-10-21 DIAGNOSIS — D649 Anemia, unspecified: Secondary | ICD-10-CM | POA: Diagnosis not present

## 2020-10-21 DIAGNOSIS — Z872 Personal history of diseases of the skin and subcutaneous tissue: Secondary | ICD-10-CM | POA: Diagnosis not present

## 2020-10-21 DIAGNOSIS — F0281 Dementia in other diseases classified elsewhere with behavioral disturbance: Secondary | ICD-10-CM | POA: Diagnosis not present

## 2020-10-28 DIAGNOSIS — G301 Alzheimer's disease with late onset: Secondary | ICD-10-CM | POA: Diagnosis not present

## 2020-10-28 DIAGNOSIS — Z9181 History of falling: Secondary | ICD-10-CM | POA: Diagnosis not present

## 2020-10-28 DIAGNOSIS — Z872 Personal history of diseases of the skin and subcutaneous tissue: Secondary | ICD-10-CM | POA: Diagnosis not present

## 2020-10-28 DIAGNOSIS — F0281 Dementia in other diseases classified elsewhere with behavioral disturbance: Secondary | ICD-10-CM | POA: Diagnosis not present

## 2020-10-28 DIAGNOSIS — Z87440 Personal history of urinary (tract) infections: Secondary | ICD-10-CM | POA: Diagnosis not present

## 2020-10-28 DIAGNOSIS — Z8616 Personal history of COVID-19: Secondary | ICD-10-CM | POA: Diagnosis not present

## 2020-11-01 DIAGNOSIS — Z87440 Personal history of urinary (tract) infections: Secondary | ICD-10-CM | POA: Diagnosis not present

## 2020-11-01 DIAGNOSIS — F0281 Dementia in other diseases classified elsewhere with behavioral disturbance: Secondary | ICD-10-CM | POA: Diagnosis not present

## 2020-11-01 DIAGNOSIS — G301 Alzheimer's disease with late onset: Secondary | ICD-10-CM | POA: Diagnosis not present

## 2020-11-01 DIAGNOSIS — Z9181 History of falling: Secondary | ICD-10-CM | POA: Diagnosis not present

## 2020-11-01 DIAGNOSIS — Z8616 Personal history of COVID-19: Secondary | ICD-10-CM | POA: Diagnosis not present

## 2020-11-01 DIAGNOSIS — Z872 Personal history of diseases of the skin and subcutaneous tissue: Secondary | ICD-10-CM | POA: Diagnosis not present

## 2020-11-04 DIAGNOSIS — F0281 Dementia in other diseases classified elsewhere with behavioral disturbance: Secondary | ICD-10-CM | POA: Diagnosis not present

## 2020-11-04 DIAGNOSIS — Z872 Personal history of diseases of the skin and subcutaneous tissue: Secondary | ICD-10-CM | POA: Diagnosis not present

## 2020-11-04 DIAGNOSIS — G301 Alzheimer's disease with late onset: Secondary | ICD-10-CM | POA: Diagnosis not present

## 2020-11-04 DIAGNOSIS — Z8616 Personal history of COVID-19: Secondary | ICD-10-CM | POA: Diagnosis not present

## 2020-11-04 DIAGNOSIS — Z87440 Personal history of urinary (tract) infections: Secondary | ICD-10-CM | POA: Diagnosis not present

## 2020-11-04 DIAGNOSIS — Z9181 History of falling: Secondary | ICD-10-CM | POA: Diagnosis not present

## 2020-11-05 DIAGNOSIS — Z8616 Personal history of COVID-19: Secondary | ICD-10-CM | POA: Diagnosis not present

## 2020-11-05 DIAGNOSIS — Z87440 Personal history of urinary (tract) infections: Secondary | ICD-10-CM | POA: Diagnosis not present

## 2020-11-05 DIAGNOSIS — G301 Alzheimer's disease with late onset: Secondary | ICD-10-CM | POA: Diagnosis not present

## 2020-11-05 DIAGNOSIS — Z872 Personal history of diseases of the skin and subcutaneous tissue: Secondary | ICD-10-CM | POA: Diagnosis not present

## 2020-11-05 DIAGNOSIS — Z9181 History of falling: Secondary | ICD-10-CM | POA: Diagnosis not present

## 2020-11-05 DIAGNOSIS — F0281 Dementia in other diseases classified elsewhere with behavioral disturbance: Secondary | ICD-10-CM | POA: Diagnosis not present

## 2020-11-11 DIAGNOSIS — Z8616 Personal history of COVID-19: Secondary | ICD-10-CM | POA: Diagnosis not present

## 2020-11-11 DIAGNOSIS — F0281 Dementia in other diseases classified elsewhere with behavioral disturbance: Secondary | ICD-10-CM | POA: Diagnosis not present

## 2020-11-11 DIAGNOSIS — Z872 Personal history of diseases of the skin and subcutaneous tissue: Secondary | ICD-10-CM | POA: Diagnosis not present

## 2020-11-11 DIAGNOSIS — G301 Alzheimer's disease with late onset: Secondary | ICD-10-CM | POA: Diagnosis not present

## 2020-11-11 DIAGNOSIS — Z9181 History of falling: Secondary | ICD-10-CM | POA: Diagnosis not present

## 2020-11-11 DIAGNOSIS — Z87440 Personal history of urinary (tract) infections: Secondary | ICD-10-CM | POA: Diagnosis not present

## 2020-11-17 DIAGNOSIS — Z8616 Personal history of COVID-19: Secondary | ICD-10-CM | POA: Diagnosis not present

## 2020-11-17 DIAGNOSIS — Z87440 Personal history of urinary (tract) infections: Secondary | ICD-10-CM | POA: Diagnosis not present

## 2020-11-17 DIAGNOSIS — Z9181 History of falling: Secondary | ICD-10-CM | POA: Diagnosis not present

## 2020-11-17 DIAGNOSIS — Z872 Personal history of diseases of the skin and subcutaneous tissue: Secondary | ICD-10-CM | POA: Diagnosis not present

## 2020-11-17 DIAGNOSIS — F0281 Dementia in other diseases classified elsewhere with behavioral disturbance: Secondary | ICD-10-CM | POA: Diagnosis not present

## 2020-11-17 DIAGNOSIS — G301 Alzheimer's disease with late onset: Secondary | ICD-10-CM | POA: Diagnosis not present

## 2020-11-20 DIAGNOSIS — Z87440 Personal history of urinary (tract) infections: Secondary | ICD-10-CM | POA: Diagnosis not present

## 2020-11-20 DIAGNOSIS — Z8616 Personal history of COVID-19: Secondary | ICD-10-CM | POA: Diagnosis not present

## 2020-11-20 DIAGNOSIS — M1990 Unspecified osteoarthritis, unspecified site: Secondary | ICD-10-CM | POA: Diagnosis not present

## 2020-11-20 DIAGNOSIS — G301 Alzheimer's disease with late onset: Secondary | ICD-10-CM | POA: Diagnosis not present

## 2020-11-20 DIAGNOSIS — Z9181 History of falling: Secondary | ICD-10-CM | POA: Diagnosis not present

## 2020-11-20 DIAGNOSIS — D649 Anemia, unspecified: Secondary | ICD-10-CM | POA: Diagnosis not present

## 2020-11-20 DIAGNOSIS — I10 Essential (primary) hypertension: Secondary | ICD-10-CM | POA: Diagnosis not present

## 2020-11-20 DIAGNOSIS — Z872 Personal history of diseases of the skin and subcutaneous tissue: Secondary | ICD-10-CM | POA: Diagnosis not present

## 2020-11-20 DIAGNOSIS — F02811 Dementia in other diseases classified elsewhere, unspecified severity, with agitation: Secondary | ICD-10-CM | POA: Diagnosis not present

## 2020-11-20 DIAGNOSIS — K219 Gastro-esophageal reflux disease without esophagitis: Secondary | ICD-10-CM | POA: Diagnosis not present

## 2020-11-26 DIAGNOSIS — Z9181 History of falling: Secondary | ICD-10-CM | POA: Diagnosis not present

## 2020-11-26 DIAGNOSIS — G301 Alzheimer's disease with late onset: Secondary | ICD-10-CM | POA: Diagnosis not present

## 2020-11-26 DIAGNOSIS — Z87440 Personal history of urinary (tract) infections: Secondary | ICD-10-CM | POA: Diagnosis not present

## 2020-11-26 DIAGNOSIS — Z8616 Personal history of COVID-19: Secondary | ICD-10-CM | POA: Diagnosis not present

## 2020-11-26 DIAGNOSIS — Z872 Personal history of diseases of the skin and subcutaneous tissue: Secondary | ICD-10-CM | POA: Diagnosis not present

## 2020-11-26 DIAGNOSIS — F02811 Dementia in other diseases classified elsewhere, unspecified severity, with agitation: Secondary | ICD-10-CM | POA: Diagnosis not present

## 2020-11-30 DIAGNOSIS — Z87440 Personal history of urinary (tract) infections: Secondary | ICD-10-CM | POA: Diagnosis not present

## 2020-11-30 DIAGNOSIS — Z8616 Personal history of COVID-19: Secondary | ICD-10-CM | POA: Diagnosis not present

## 2020-11-30 DIAGNOSIS — Z872 Personal history of diseases of the skin and subcutaneous tissue: Secondary | ICD-10-CM | POA: Diagnosis not present

## 2020-11-30 DIAGNOSIS — G301 Alzheimer's disease with late onset: Secondary | ICD-10-CM | POA: Diagnosis not present

## 2020-11-30 DIAGNOSIS — Z9181 History of falling: Secondary | ICD-10-CM | POA: Diagnosis not present

## 2020-11-30 DIAGNOSIS — F02811 Dementia in other diseases classified elsewhere, unspecified severity, with agitation: Secondary | ICD-10-CM | POA: Diagnosis not present

## 2020-12-08 DIAGNOSIS — Z9181 History of falling: Secondary | ICD-10-CM | POA: Diagnosis not present

## 2020-12-08 DIAGNOSIS — F02811 Dementia in other diseases classified elsewhere, unspecified severity, with agitation: Secondary | ICD-10-CM | POA: Diagnosis not present

## 2020-12-08 DIAGNOSIS — Z872 Personal history of diseases of the skin and subcutaneous tissue: Secondary | ICD-10-CM | POA: Diagnosis not present

## 2020-12-08 DIAGNOSIS — Z8616 Personal history of COVID-19: Secondary | ICD-10-CM | POA: Diagnosis not present

## 2020-12-08 DIAGNOSIS — G301 Alzheimer's disease with late onset: Secondary | ICD-10-CM | POA: Diagnosis not present

## 2020-12-08 DIAGNOSIS — Z87440 Personal history of urinary (tract) infections: Secondary | ICD-10-CM | POA: Diagnosis not present

## 2020-12-15 DIAGNOSIS — G301 Alzheimer's disease with late onset: Secondary | ICD-10-CM | POA: Diagnosis not present

## 2020-12-15 DIAGNOSIS — Z87440 Personal history of urinary (tract) infections: Secondary | ICD-10-CM | POA: Diagnosis not present

## 2020-12-15 DIAGNOSIS — F02811 Dementia in other diseases classified elsewhere, unspecified severity, with agitation: Secondary | ICD-10-CM | POA: Diagnosis not present

## 2020-12-15 DIAGNOSIS — Z872 Personal history of diseases of the skin and subcutaneous tissue: Secondary | ICD-10-CM | POA: Diagnosis not present

## 2020-12-15 DIAGNOSIS — Z8616 Personal history of COVID-19: Secondary | ICD-10-CM | POA: Diagnosis not present

## 2020-12-15 DIAGNOSIS — Z9181 History of falling: Secondary | ICD-10-CM | POA: Diagnosis not present

## 2020-12-16 DIAGNOSIS — Z8616 Personal history of COVID-19: Secondary | ICD-10-CM | POA: Diagnosis not present

## 2020-12-16 DIAGNOSIS — F02811 Dementia in other diseases classified elsewhere, unspecified severity, with agitation: Secondary | ICD-10-CM | POA: Diagnosis not present

## 2020-12-16 DIAGNOSIS — G301 Alzheimer's disease with late onset: Secondary | ICD-10-CM | POA: Diagnosis not present

## 2020-12-16 DIAGNOSIS — Z9181 History of falling: Secondary | ICD-10-CM | POA: Diagnosis not present

## 2020-12-16 DIAGNOSIS — Z87440 Personal history of urinary (tract) infections: Secondary | ICD-10-CM | POA: Diagnosis not present

## 2020-12-16 DIAGNOSIS — Z872 Personal history of diseases of the skin and subcutaneous tissue: Secondary | ICD-10-CM | POA: Diagnosis not present

## 2020-12-21 DIAGNOSIS — Z87440 Personal history of urinary (tract) infections: Secondary | ICD-10-CM | POA: Diagnosis not present

## 2020-12-21 DIAGNOSIS — M1990 Unspecified osteoarthritis, unspecified site: Secondary | ICD-10-CM | POA: Diagnosis not present

## 2020-12-21 DIAGNOSIS — Z872 Personal history of diseases of the skin and subcutaneous tissue: Secondary | ICD-10-CM | POA: Diagnosis not present

## 2020-12-21 DIAGNOSIS — G301 Alzheimer's disease with late onset: Secondary | ICD-10-CM | POA: Diagnosis not present

## 2020-12-21 DIAGNOSIS — I10 Essential (primary) hypertension: Secondary | ICD-10-CM | POA: Diagnosis not present

## 2020-12-21 DIAGNOSIS — Z9181 History of falling: Secondary | ICD-10-CM | POA: Diagnosis not present

## 2020-12-21 DIAGNOSIS — D649 Anemia, unspecified: Secondary | ICD-10-CM | POA: Diagnosis not present

## 2020-12-21 DIAGNOSIS — F02811 Dementia in other diseases classified elsewhere, unspecified severity, with agitation: Secondary | ICD-10-CM | POA: Diagnosis not present

## 2020-12-21 DIAGNOSIS — K219 Gastro-esophageal reflux disease without esophagitis: Secondary | ICD-10-CM | POA: Diagnosis not present

## 2020-12-21 DIAGNOSIS — Z8616 Personal history of COVID-19: Secondary | ICD-10-CM | POA: Diagnosis not present

## 2020-12-24 DIAGNOSIS — Z9181 History of falling: Secondary | ICD-10-CM | POA: Diagnosis not present

## 2020-12-24 DIAGNOSIS — F02811 Dementia in other diseases classified elsewhere, unspecified severity, with agitation: Secondary | ICD-10-CM | POA: Diagnosis not present

## 2020-12-24 DIAGNOSIS — Z872 Personal history of diseases of the skin and subcutaneous tissue: Secondary | ICD-10-CM | POA: Diagnosis not present

## 2020-12-24 DIAGNOSIS — Z87440 Personal history of urinary (tract) infections: Secondary | ICD-10-CM | POA: Diagnosis not present

## 2020-12-24 DIAGNOSIS — G301 Alzheimer's disease with late onset: Secondary | ICD-10-CM | POA: Diagnosis not present

## 2020-12-24 DIAGNOSIS — Z8616 Personal history of COVID-19: Secondary | ICD-10-CM | POA: Diagnosis not present

## 2020-12-27 DIAGNOSIS — Z9181 History of falling: Secondary | ICD-10-CM | POA: Diagnosis not present

## 2020-12-27 DIAGNOSIS — Z872 Personal history of diseases of the skin and subcutaneous tissue: Secondary | ICD-10-CM | POA: Diagnosis not present

## 2020-12-27 DIAGNOSIS — G301 Alzheimer's disease with late onset: Secondary | ICD-10-CM | POA: Diagnosis not present

## 2020-12-27 DIAGNOSIS — Z8616 Personal history of COVID-19: Secondary | ICD-10-CM | POA: Diagnosis not present

## 2020-12-27 DIAGNOSIS — F02811 Dementia in other diseases classified elsewhere, unspecified severity, with agitation: Secondary | ICD-10-CM | POA: Diagnosis not present

## 2020-12-27 DIAGNOSIS — Z87440 Personal history of urinary (tract) infections: Secondary | ICD-10-CM | POA: Diagnosis not present

## 2020-12-31 DIAGNOSIS — Z872 Personal history of diseases of the skin and subcutaneous tissue: Secondary | ICD-10-CM | POA: Diagnosis not present

## 2020-12-31 DIAGNOSIS — Z87440 Personal history of urinary (tract) infections: Secondary | ICD-10-CM | POA: Diagnosis not present

## 2020-12-31 DIAGNOSIS — Z8616 Personal history of COVID-19: Secondary | ICD-10-CM | POA: Diagnosis not present

## 2020-12-31 DIAGNOSIS — G301 Alzheimer's disease with late onset: Secondary | ICD-10-CM | POA: Diagnosis not present

## 2020-12-31 DIAGNOSIS — Z9181 History of falling: Secondary | ICD-10-CM | POA: Diagnosis not present

## 2020-12-31 DIAGNOSIS — F02811 Dementia in other diseases classified elsewhere, unspecified severity, with agitation: Secondary | ICD-10-CM | POA: Diagnosis not present

## 2021-01-07 DIAGNOSIS — Z8616 Personal history of COVID-19: Secondary | ICD-10-CM | POA: Diagnosis not present

## 2021-01-07 DIAGNOSIS — G301 Alzheimer's disease with late onset: Secondary | ICD-10-CM | POA: Diagnosis not present

## 2021-01-07 DIAGNOSIS — Z9181 History of falling: Secondary | ICD-10-CM | POA: Diagnosis not present

## 2021-01-07 DIAGNOSIS — Z872 Personal history of diseases of the skin and subcutaneous tissue: Secondary | ICD-10-CM | POA: Diagnosis not present

## 2021-01-07 DIAGNOSIS — F02811 Dementia in other diseases classified elsewhere, unspecified severity, with agitation: Secondary | ICD-10-CM | POA: Diagnosis not present

## 2021-01-07 DIAGNOSIS — Z87440 Personal history of urinary (tract) infections: Secondary | ICD-10-CM | POA: Diagnosis not present

## 2021-01-10 DIAGNOSIS — F02811 Dementia in other diseases classified elsewhere, unspecified severity, with agitation: Secondary | ICD-10-CM | POA: Diagnosis not present

## 2021-01-10 DIAGNOSIS — Z87440 Personal history of urinary (tract) infections: Secondary | ICD-10-CM | POA: Diagnosis not present

## 2021-01-10 DIAGNOSIS — Z9181 History of falling: Secondary | ICD-10-CM | POA: Diagnosis not present

## 2021-01-10 DIAGNOSIS — Z8616 Personal history of COVID-19: Secondary | ICD-10-CM | POA: Diagnosis not present

## 2021-01-10 DIAGNOSIS — Z872 Personal history of diseases of the skin and subcutaneous tissue: Secondary | ICD-10-CM | POA: Diagnosis not present

## 2021-01-10 DIAGNOSIS — G301 Alzheimer's disease with late onset: Secondary | ICD-10-CM | POA: Diagnosis not present

## 2021-01-19 DIAGNOSIS — Z872 Personal history of diseases of the skin and subcutaneous tissue: Secondary | ICD-10-CM | POA: Diagnosis not present

## 2021-01-19 DIAGNOSIS — Z87440 Personal history of urinary (tract) infections: Secondary | ICD-10-CM | POA: Diagnosis not present

## 2021-01-19 DIAGNOSIS — F02811 Dementia in other diseases classified elsewhere, unspecified severity, with agitation: Secondary | ICD-10-CM | POA: Diagnosis not present

## 2021-01-19 DIAGNOSIS — G301 Alzheimer's disease with late onset: Secondary | ICD-10-CM | POA: Diagnosis not present

## 2021-01-19 DIAGNOSIS — Z9181 History of falling: Secondary | ICD-10-CM | POA: Diagnosis not present

## 2021-01-19 DIAGNOSIS — Z8616 Personal history of COVID-19: Secondary | ICD-10-CM | POA: Diagnosis not present

## 2021-01-20 DIAGNOSIS — F02811 Dementia in other diseases classified elsewhere, unspecified severity, with agitation: Secondary | ICD-10-CM | POA: Diagnosis not present

## 2021-01-20 DIAGNOSIS — Z87440 Personal history of urinary (tract) infections: Secondary | ICD-10-CM | POA: Diagnosis not present

## 2021-01-20 DIAGNOSIS — Z9181 History of falling: Secondary | ICD-10-CM | POA: Diagnosis not present

## 2021-01-20 DIAGNOSIS — K219 Gastro-esophageal reflux disease without esophagitis: Secondary | ICD-10-CM | POA: Diagnosis not present

## 2021-01-20 DIAGNOSIS — Z872 Personal history of diseases of the skin and subcutaneous tissue: Secondary | ICD-10-CM | POA: Diagnosis not present

## 2021-01-20 DIAGNOSIS — M1990 Unspecified osteoarthritis, unspecified site: Secondary | ICD-10-CM | POA: Diagnosis not present

## 2021-01-20 DIAGNOSIS — I10 Essential (primary) hypertension: Secondary | ICD-10-CM | POA: Diagnosis not present

## 2021-01-20 DIAGNOSIS — Z8616 Personal history of COVID-19: Secondary | ICD-10-CM | POA: Diagnosis not present

## 2021-01-20 DIAGNOSIS — G301 Alzheimer's disease with late onset: Secondary | ICD-10-CM | POA: Diagnosis not present

## 2021-01-20 DIAGNOSIS — D649 Anemia, unspecified: Secondary | ICD-10-CM | POA: Diagnosis not present

## 2021-01-26 DIAGNOSIS — Z872 Personal history of diseases of the skin and subcutaneous tissue: Secondary | ICD-10-CM | POA: Diagnosis not present

## 2021-01-26 DIAGNOSIS — G301 Alzheimer's disease with late onset: Secondary | ICD-10-CM | POA: Diagnosis not present

## 2021-01-26 DIAGNOSIS — Z8616 Personal history of COVID-19: Secondary | ICD-10-CM | POA: Diagnosis not present

## 2021-01-26 DIAGNOSIS — F02811 Dementia in other diseases classified elsewhere, unspecified severity, with agitation: Secondary | ICD-10-CM | POA: Diagnosis not present

## 2021-01-26 DIAGNOSIS — Z87440 Personal history of urinary (tract) infections: Secondary | ICD-10-CM | POA: Diagnosis not present

## 2021-01-26 DIAGNOSIS — Z9181 History of falling: Secondary | ICD-10-CM | POA: Diagnosis not present

## 2021-02-07 DIAGNOSIS — Z9181 History of falling: Secondary | ICD-10-CM | POA: Diagnosis not present

## 2021-02-07 DIAGNOSIS — G301 Alzheimer's disease with late onset: Secondary | ICD-10-CM | POA: Diagnosis not present

## 2021-02-07 DIAGNOSIS — Z872 Personal history of diseases of the skin and subcutaneous tissue: Secondary | ICD-10-CM | POA: Diagnosis not present

## 2021-02-07 DIAGNOSIS — Z8616 Personal history of COVID-19: Secondary | ICD-10-CM | POA: Diagnosis not present

## 2021-02-07 DIAGNOSIS — Z87440 Personal history of urinary (tract) infections: Secondary | ICD-10-CM | POA: Diagnosis not present

## 2021-02-07 DIAGNOSIS — F02811 Dementia in other diseases classified elsewhere, unspecified severity, with agitation: Secondary | ICD-10-CM | POA: Diagnosis not present

## 2021-02-09 DIAGNOSIS — Z9181 History of falling: Secondary | ICD-10-CM | POA: Diagnosis not present

## 2021-02-09 DIAGNOSIS — F02811 Dementia in other diseases classified elsewhere, unspecified severity, with agitation: Secondary | ICD-10-CM | POA: Diagnosis not present

## 2021-02-09 DIAGNOSIS — Z87440 Personal history of urinary (tract) infections: Secondary | ICD-10-CM | POA: Diagnosis not present

## 2021-02-09 DIAGNOSIS — Z872 Personal history of diseases of the skin and subcutaneous tissue: Secondary | ICD-10-CM | POA: Diagnosis not present

## 2021-02-09 DIAGNOSIS — Z8616 Personal history of COVID-19: Secondary | ICD-10-CM | POA: Diagnosis not present

## 2021-02-09 DIAGNOSIS — G301 Alzheimer's disease with late onset: Secondary | ICD-10-CM | POA: Diagnosis not present

## 2021-02-16 DIAGNOSIS — F02811 Dementia in other diseases classified elsewhere, unspecified severity, with agitation: Secondary | ICD-10-CM | POA: Diagnosis not present

## 2021-02-16 DIAGNOSIS — G301 Alzheimer's disease with late onset: Secondary | ICD-10-CM | POA: Diagnosis not present

## 2021-02-16 DIAGNOSIS — Z9181 History of falling: Secondary | ICD-10-CM | POA: Diagnosis not present

## 2021-02-16 DIAGNOSIS — Z87440 Personal history of urinary (tract) infections: Secondary | ICD-10-CM | POA: Diagnosis not present

## 2021-02-16 DIAGNOSIS — Z8616 Personal history of COVID-19: Secondary | ICD-10-CM | POA: Diagnosis not present

## 2021-02-16 DIAGNOSIS — Z872 Personal history of diseases of the skin and subcutaneous tissue: Secondary | ICD-10-CM | POA: Diagnosis not present

## 2021-02-20 DIAGNOSIS — Z87440 Personal history of urinary (tract) infections: Secondary | ICD-10-CM | POA: Diagnosis not present

## 2021-02-20 DIAGNOSIS — K219 Gastro-esophageal reflux disease without esophagitis: Secondary | ICD-10-CM | POA: Diagnosis not present

## 2021-02-20 DIAGNOSIS — Z8616 Personal history of COVID-19: Secondary | ICD-10-CM | POA: Diagnosis not present

## 2021-02-20 DIAGNOSIS — I10 Essential (primary) hypertension: Secondary | ICD-10-CM | POA: Diagnosis not present

## 2021-02-20 DIAGNOSIS — D649 Anemia, unspecified: Secondary | ICD-10-CM | POA: Diagnosis not present

## 2021-02-20 DIAGNOSIS — G301 Alzheimer's disease with late onset: Secondary | ICD-10-CM | POA: Diagnosis not present

## 2021-02-20 DIAGNOSIS — F02811 Dementia in other diseases classified elsewhere, unspecified severity, with agitation: Secondary | ICD-10-CM | POA: Diagnosis not present

## 2021-02-20 DIAGNOSIS — Z9181 History of falling: Secondary | ICD-10-CM | POA: Diagnosis not present

## 2021-02-20 DIAGNOSIS — Z872 Personal history of diseases of the skin and subcutaneous tissue: Secondary | ICD-10-CM | POA: Diagnosis not present

## 2021-02-20 DIAGNOSIS — M1990 Unspecified osteoarthritis, unspecified site: Secondary | ICD-10-CM | POA: Diagnosis not present

## 2021-02-22 DIAGNOSIS — Z872 Personal history of diseases of the skin and subcutaneous tissue: Secondary | ICD-10-CM | POA: Diagnosis not present

## 2021-02-22 DIAGNOSIS — Z8616 Personal history of COVID-19: Secondary | ICD-10-CM | POA: Diagnosis not present

## 2021-02-22 DIAGNOSIS — Z9181 History of falling: Secondary | ICD-10-CM | POA: Diagnosis not present

## 2021-02-22 DIAGNOSIS — Z87440 Personal history of urinary (tract) infections: Secondary | ICD-10-CM | POA: Diagnosis not present

## 2021-02-22 DIAGNOSIS — G301 Alzheimer's disease with late onset: Secondary | ICD-10-CM | POA: Diagnosis not present

## 2021-02-22 DIAGNOSIS — F02811 Dementia in other diseases classified elsewhere, unspecified severity, with agitation: Secondary | ICD-10-CM | POA: Diagnosis not present

## 2021-02-23 DIAGNOSIS — G301 Alzheimer's disease with late onset: Secondary | ICD-10-CM | POA: Diagnosis not present

## 2021-02-23 DIAGNOSIS — Z872 Personal history of diseases of the skin and subcutaneous tissue: Secondary | ICD-10-CM | POA: Diagnosis not present

## 2021-02-23 DIAGNOSIS — Z8616 Personal history of COVID-19: Secondary | ICD-10-CM | POA: Diagnosis not present

## 2021-02-23 DIAGNOSIS — Z9181 History of falling: Secondary | ICD-10-CM | POA: Diagnosis not present

## 2021-02-23 DIAGNOSIS — Z87440 Personal history of urinary (tract) infections: Secondary | ICD-10-CM | POA: Diagnosis not present

## 2021-02-23 DIAGNOSIS — F02811 Dementia in other diseases classified elsewhere, unspecified severity, with agitation: Secondary | ICD-10-CM | POA: Diagnosis not present

## 2021-02-25 DIAGNOSIS — G301 Alzheimer's disease with late onset: Secondary | ICD-10-CM | POA: Diagnosis not present

## 2021-02-25 DIAGNOSIS — Z872 Personal history of diseases of the skin and subcutaneous tissue: Secondary | ICD-10-CM | POA: Diagnosis not present

## 2021-02-25 DIAGNOSIS — Z8616 Personal history of COVID-19: Secondary | ICD-10-CM | POA: Diagnosis not present

## 2021-02-25 DIAGNOSIS — Z9181 History of falling: Secondary | ICD-10-CM | POA: Diagnosis not present

## 2021-02-25 DIAGNOSIS — Z87440 Personal history of urinary (tract) infections: Secondary | ICD-10-CM | POA: Diagnosis not present

## 2021-02-25 DIAGNOSIS — F02811 Dementia in other diseases classified elsewhere, unspecified severity, with agitation: Secondary | ICD-10-CM | POA: Diagnosis not present

## 2021-03-01 DIAGNOSIS — Z9181 History of falling: Secondary | ICD-10-CM | POA: Diagnosis not present

## 2021-03-01 DIAGNOSIS — Z87440 Personal history of urinary (tract) infections: Secondary | ICD-10-CM | POA: Diagnosis not present

## 2021-03-01 DIAGNOSIS — G301 Alzheimer's disease with late onset: Secondary | ICD-10-CM | POA: Diagnosis not present

## 2021-03-01 DIAGNOSIS — Z872 Personal history of diseases of the skin and subcutaneous tissue: Secondary | ICD-10-CM | POA: Diagnosis not present

## 2021-03-01 DIAGNOSIS — F02811 Dementia in other diseases classified elsewhere, unspecified severity, with agitation: Secondary | ICD-10-CM | POA: Diagnosis not present

## 2021-03-01 DIAGNOSIS — Z8616 Personal history of COVID-19: Secondary | ICD-10-CM | POA: Diagnosis not present

## 2021-03-08 DIAGNOSIS — G301 Alzheimer's disease with late onset: Secondary | ICD-10-CM | POA: Diagnosis not present

## 2021-03-08 DIAGNOSIS — F02811 Dementia in other diseases classified elsewhere, unspecified severity, with agitation: Secondary | ICD-10-CM | POA: Diagnosis not present

## 2021-03-08 DIAGNOSIS — Z8616 Personal history of COVID-19: Secondary | ICD-10-CM | POA: Diagnosis not present

## 2021-03-08 DIAGNOSIS — Z872 Personal history of diseases of the skin and subcutaneous tissue: Secondary | ICD-10-CM | POA: Diagnosis not present

## 2021-03-08 DIAGNOSIS — Z87440 Personal history of urinary (tract) infections: Secondary | ICD-10-CM | POA: Diagnosis not present

## 2021-03-08 DIAGNOSIS — Z9181 History of falling: Secondary | ICD-10-CM | POA: Diagnosis not present

## 2021-03-09 DIAGNOSIS — F02811 Dementia in other diseases classified elsewhere, unspecified severity, with agitation: Secondary | ICD-10-CM | POA: Diagnosis not present

## 2021-03-09 DIAGNOSIS — G301 Alzheimer's disease with late onset: Secondary | ICD-10-CM | POA: Diagnosis not present

## 2021-03-09 DIAGNOSIS — Z8616 Personal history of COVID-19: Secondary | ICD-10-CM | POA: Diagnosis not present

## 2021-03-09 DIAGNOSIS — Z87440 Personal history of urinary (tract) infections: Secondary | ICD-10-CM | POA: Diagnosis not present

## 2021-03-09 DIAGNOSIS — Z9181 History of falling: Secondary | ICD-10-CM | POA: Diagnosis not present

## 2021-03-09 DIAGNOSIS — Z872 Personal history of diseases of the skin and subcutaneous tissue: Secondary | ICD-10-CM | POA: Diagnosis not present

## 2021-03-12 DIAGNOSIS — M1990 Unspecified osteoarthritis, unspecified site: Secondary | ICD-10-CM | POA: Diagnosis not present

## 2021-03-12 DIAGNOSIS — M6281 Muscle weakness (generalized): Secondary | ICD-10-CM | POA: Diagnosis not present

## 2021-03-12 DIAGNOSIS — G309 Alzheimer's disease, unspecified: Secondary | ICD-10-CM | POA: Diagnosis not present

## 2021-03-12 DIAGNOSIS — I10 Essential (primary) hypertension: Secondary | ICD-10-CM | POA: Diagnosis not present

## 2021-04-08 DIAGNOSIS — I10 Essential (primary) hypertension: Secondary | ICD-10-CM | POA: Diagnosis not present

## 2021-04-10 DIAGNOSIS — I10 Essential (primary) hypertension: Secondary | ICD-10-CM | POA: Diagnosis not present

## 2021-04-10 DIAGNOSIS — M1990 Unspecified osteoarthritis, unspecified site: Secondary | ICD-10-CM | POA: Diagnosis not present

## 2021-04-21 DIAGNOSIS — M1990 Unspecified osteoarthritis, unspecified site: Secondary | ICD-10-CM | POA: Diagnosis not present

## 2021-04-21 DIAGNOSIS — I10 Essential (primary) hypertension: Secondary | ICD-10-CM | POA: Diagnosis not present

## 2021-04-21 DIAGNOSIS — K5900 Constipation, unspecified: Secondary | ICD-10-CM | POA: Diagnosis not present

## 2021-04-21 DIAGNOSIS — Z7689 Persons encountering health services in other specified circumstances: Secondary | ICD-10-CM | POA: Diagnosis not present

## 2021-06-09 ENCOUNTER — Inpatient Hospital Stay (HOSPITAL_COMMUNITY)
Admission: EM | Admit: 2021-06-09 | Discharge: 2021-06-13 | DRG: 871 | Disposition: A | Discharge status: Home Health | Payer: Medicare Other | Attending: Internal Medicine | Admitting: Internal Medicine

## 2021-06-09 ENCOUNTER — Other Ambulatory Visit: Payer: Self-pay

## 2021-06-09 ENCOUNTER — Emergency Department (HOSPITAL_COMMUNITY): Payer: Medicare Other

## 2021-06-09 DIAGNOSIS — Z90710 Acquired absence of both cervix and uterus: Secondary | ICD-10-CM

## 2021-06-09 DIAGNOSIS — R000 Tachycardia, unspecified: Secondary | ICD-10-CM | POA: Diagnosis not present

## 2021-06-09 DIAGNOSIS — Z6824 Body mass index (BMI) 24.0-24.9, adult: Secondary | ICD-10-CM | POA: Diagnosis not present

## 2021-06-09 DIAGNOSIS — Z888 Allergy status to other drugs, medicaments and biological substances status: Secondary | ICD-10-CM

## 2021-06-09 DIAGNOSIS — F03918 Unspecified dementia, unspecified severity, with other behavioral disturbance: Secondary | ICD-10-CM | POA: Diagnosis not present

## 2021-06-09 DIAGNOSIS — B961 Klebsiella pneumoniae [K. pneumoniae] as the cause of diseases classified elsewhere: Secondary | ICD-10-CM | POA: Diagnosis present

## 2021-06-09 DIAGNOSIS — F02811 Dementia in other diseases classified elsewhere, unspecified severity, with agitation: Secondary | ICD-10-CM | POA: Diagnosis present

## 2021-06-09 DIAGNOSIS — Z8673 Personal history of transient ischemic attack (TIA), and cerebral infarction without residual deficits: Secondary | ICD-10-CM | POA: Diagnosis not present

## 2021-06-09 DIAGNOSIS — I10 Essential (primary) hypertension: Secondary | ICD-10-CM | POA: Diagnosis not present

## 2021-06-09 DIAGNOSIS — Z823 Family history of stroke: Secondary | ICD-10-CM

## 2021-06-09 DIAGNOSIS — R32 Unspecified urinary incontinence: Secondary | ICD-10-CM | POA: Diagnosis present

## 2021-06-09 DIAGNOSIS — G9340 Encephalopathy, unspecified: Secondary | ICD-10-CM | POA: Diagnosis not present

## 2021-06-09 DIAGNOSIS — R531 Weakness: Secondary | ICD-10-CM | POA: Diagnosis not present

## 2021-06-09 DIAGNOSIS — E162 Hypoglycemia, unspecified: Secondary | ICD-10-CM | POA: Diagnosis not present

## 2021-06-09 DIAGNOSIS — R627 Adult failure to thrive: Secondary | ICD-10-CM | POA: Diagnosis present

## 2021-06-09 DIAGNOSIS — R197 Diarrhea, unspecified: Secondary | ICD-10-CM | POA: Diagnosis present

## 2021-06-09 DIAGNOSIS — K6389 Other specified diseases of intestine: Secondary | ICD-10-CM | POA: Diagnosis not present

## 2021-06-09 DIAGNOSIS — R059 Cough, unspecified: Secondary | ICD-10-CM | POA: Diagnosis not present

## 2021-06-09 DIAGNOSIS — R5381 Other malaise: Secondary | ICD-10-CM | POA: Diagnosis not present

## 2021-06-09 DIAGNOSIS — Z7401 Bed confinement status: Secondary | ICD-10-CM

## 2021-06-09 DIAGNOSIS — I959 Hypotension, unspecified: Secondary | ICD-10-CM | POA: Diagnosis not present

## 2021-06-09 DIAGNOSIS — N390 Urinary tract infection, site not specified: Secondary | ICD-10-CM | POA: Diagnosis not present

## 2021-06-09 DIAGNOSIS — M1990 Unspecified osteoarthritis, unspecified site: Secondary | ICD-10-CM | POA: Diagnosis present

## 2021-06-09 DIAGNOSIS — Z7189 Other specified counseling: Secondary | ICD-10-CM | POA: Diagnosis not present

## 2021-06-09 DIAGNOSIS — I110 Hypertensive heart disease with heart failure: Secondary | ICD-10-CM | POA: Diagnosis present

## 2021-06-09 DIAGNOSIS — I639 Cerebral infarction, unspecified: Secondary | ICD-10-CM | POA: Diagnosis not present

## 2021-06-09 DIAGNOSIS — I700 Atherosclerosis of aorta: Secondary | ICD-10-CM | POA: Diagnosis not present

## 2021-06-09 DIAGNOSIS — G309 Alzheimer's disease, unspecified: Secondary | ICD-10-CM | POA: Diagnosis present

## 2021-06-09 DIAGNOSIS — I5032 Chronic diastolic (congestive) heart failure: Secondary | ICD-10-CM | POA: Diagnosis present

## 2021-06-09 DIAGNOSIS — Z20822 Contact with and (suspected) exposure to covid-19: Secondary | ICD-10-CM | POA: Diagnosis present

## 2021-06-09 DIAGNOSIS — G9341 Metabolic encephalopathy: Secondary | ICD-10-CM | POA: Diagnosis present

## 2021-06-09 DIAGNOSIS — M160 Bilateral primary osteoarthritis of hip: Secondary | ICD-10-CM | POA: Diagnosis not present

## 2021-06-09 DIAGNOSIS — Z79899 Other long term (current) drug therapy: Secondary | ICD-10-CM

## 2021-06-09 DIAGNOSIS — A4159 Other Gram-negative sepsis: Principal | ICD-10-CM | POA: Diagnosis present

## 2021-06-09 DIAGNOSIS — A419 Sepsis, unspecified organism: Secondary | ICD-10-CM | POA: Diagnosis not present

## 2021-06-09 DIAGNOSIS — Z66 Do not resuscitate: Secondary | ICD-10-CM | POA: Diagnosis present

## 2021-06-09 DIAGNOSIS — K5900 Constipation, unspecified: Secondary | ICD-10-CM | POA: Diagnosis present

## 2021-06-09 DIAGNOSIS — D696 Thrombocytopenia, unspecified: Secondary | ICD-10-CM | POA: Diagnosis present

## 2021-06-09 DIAGNOSIS — R410 Disorientation, unspecified: Secondary | ICD-10-CM

## 2021-06-09 DIAGNOSIS — Z881 Allergy status to other antibiotic agents status: Secondary | ICD-10-CM

## 2021-06-09 DIAGNOSIS — B9689 Other specified bacterial agents as the cause of diseases classified elsewhere: Secondary | ICD-10-CM

## 2021-06-09 DIAGNOSIS — F05 Delirium due to known physiological condition: Secondary | ICD-10-CM | POA: Diagnosis present

## 2021-06-09 DIAGNOSIS — I6782 Cerebral ischemia: Secondary | ICD-10-CM | POA: Diagnosis not present

## 2021-06-09 DIAGNOSIS — Z8249 Family history of ischemic heart disease and other diseases of the circulatory system: Secondary | ICD-10-CM

## 2021-06-09 DIAGNOSIS — Z880 Allergy status to penicillin: Secondary | ICD-10-CM

## 2021-06-09 DIAGNOSIS — R031 Nonspecific low blood-pressure reading: Secondary | ICD-10-CM | POA: Diagnosis not present

## 2021-06-09 DIAGNOSIS — R4182 Altered mental status, unspecified: Secondary | ICD-10-CM | POA: Diagnosis not present

## 2021-06-09 DIAGNOSIS — R404 Transient alteration of awareness: Secondary | ICD-10-CM | POA: Diagnosis not present

## 2021-06-09 DIAGNOSIS — Z515 Encounter for palliative care: Secondary | ICD-10-CM | POA: Diagnosis not present

## 2021-06-09 LAB — URINALYSIS, ROUTINE W REFLEX MICROSCOPIC
Bilirubin Urine: NEGATIVE
Glucose, UA: NEGATIVE mg/dL
Ketones, ur: NEGATIVE mg/dL
Nitrite: NEGATIVE
Protein, ur: 100 mg/dL — AB
RBC / HPF: >50 RBC/hpf — ABNORMAL HIGH (ref 0–5)
Specific Gravity, Urine: 1.013 (ref 1.005–1.030)
WBC, UA: >50 WBC/hpf — ABNORMAL HIGH (ref 0–5)
pH: 6 (ref 5.0–8.0)

## 2021-06-09 LAB — CBC WITH DIFFERENTIAL/PLATELET
Abs Immature Granulocytes: 0.15 10*3/uL — ABNORMAL HIGH (ref 0.00–0.07)
Basophils Absolute: 0 10*3/uL (ref 0.0–0.1)
Basophils Relative: 0 %
Eosinophils Absolute: 0 10*3/uL (ref 0.0–0.5)
Eosinophils Relative: 0 %
HCT: 29.6 % — ABNORMAL LOW (ref 36.0–46.0)
Hemoglobin: 9.8 g/dL — ABNORMAL LOW (ref 12.0–15.0)
Immature Granulocytes: 1 %
Lymphocytes Relative: 3 %
Lymphs Abs: 0.5 10*3/uL — ABNORMAL LOW (ref 0.7–4.0)
MCH: 24.4 pg — ABNORMAL LOW (ref 26.0–34.0)
MCHC: 33.1 g/dL (ref 30.0–36.0)
MCV: 73.6 fL — ABNORMAL LOW (ref 80.0–100.0)
Monocytes Absolute: 0.9 10*3/uL (ref 0.1–1.0)
Monocytes Relative: 6 %
Neutro Abs: 14.7 10*3/uL — ABNORMAL HIGH (ref 1.7–7.7)
Neutrophils Relative %: 90 %
Platelets: 90 10*3/uL — ABNORMAL LOW (ref 150–400)
RBC: 4.02 MIL/uL (ref 3.87–5.11)
RDW: 17.2 % — ABNORMAL HIGH (ref 11.5–15.5)
WBC: 16.2 10*3/uL — ABNORMAL HIGH (ref 4.0–10.5)
nRBC: 0.2 % (ref 0.0–0.2)

## 2021-06-09 LAB — COMPREHENSIVE METABOLIC PANEL
ALT: 38 U/L (ref 0–44)
AST: 53 U/L — ABNORMAL HIGH (ref 15–41)
Albumin: 3.1 g/dL — ABNORMAL LOW (ref 3.5–5.0)
Alkaline Phosphatase: 89 U/L (ref 38–126)
Anion gap: 8 (ref 5–15)
BUN: 30 mg/dL — ABNORMAL HIGH (ref 8–23)
CO2: 26 mmol/L (ref 22–32)
Calcium: 9.4 mg/dL (ref 8.9–10.3)
Chloride: 107 mmol/L (ref 98–111)
Creatinine, Ser: 0.86 mg/dL (ref 0.44–1.00)
GFR, Estimated: >60 mL/min (ref >60)
Glucose, Bld: 152 mg/dL — ABNORMAL HIGH (ref 70–99)
Potassium: 3.6 mmol/L (ref 3.5–5.1)
Sodium: 141 mmol/L (ref 135–145)
Total Bilirubin: 0.6 mg/dL (ref 0.3–1.2)
Total Protein: 6.4 g/dL — ABNORMAL LOW (ref 6.5–8.1)

## 2021-06-09 LAB — CBG MONITORING, ED: Glucose-Capillary: 141 mg/dL — ABNORMAL HIGH (ref 70–99)

## 2021-06-09 LAB — LIPASE, BLOOD: Lipase: 23 U/L (ref 11–51)

## 2021-06-09 LAB — RESP PANEL BY RT-PCR (FLU A&B, COVID) ARPGX2
Influenza A by PCR: NEGATIVE
Influenza B by PCR: NEGATIVE
SARS Coronavirus 2 by RT PCR: NEGATIVE

## 2021-06-09 MED ORDER — LACTATED RINGERS IV BOLUS
500.0000 mL | Freq: Once | INTRAVENOUS | Status: AC
  Administered 2021-06-09: 500 mL via INTRAVENOUS

## 2021-06-09 MED ORDER — SODIUM CHLORIDE 0.9 % IV SOLN
1.0000 g | Freq: Once | INTRAVENOUS | Status: AC
  Administered 2021-06-09: 1 g via INTRAVENOUS
  Filled 2021-06-09: qty 10

## 2021-06-09 NOTE — ED Triage Notes (Signed)
Pt BIBA from home. Pt family called EMS d/t long bout of diarrhea (no blood noted) this AM along with increased lethargy.  ? ?Hx alzheimer's and failure to thrive ? ?Family noted mentation is close to baseline ? ?BP: 112/70 ?HR: 102 ?RR: 24 ?SPO2: 94 RA ?CBG: 189 ?

## 2021-06-09 NOTE — ED Notes (Signed)
Patient transported to MRI 

## 2021-06-09 NOTE — ED Provider Notes (Signed)
?[facility] ?Provider Note ? ? ?CSN: [id number] ?Arrival date & time: 06/09/21  1535 ? ?LEVEL 5 CAVEAT - DEMENTIA  ? ?History ? ?Chief Complaint  ?Patient presents with  ? Diarrhea  ? Fatigue  ? ? ?Tara Dixon is a 88 y.o. female. ? ?HPI ?88 year old female presents with diarrhea and fatigue/weakness. History is initially from EMS only, as patient has significant dementia and no family is present. They report that she normally is bedbound but they can assist her with eating and she can talk to them. Today has been weaker/sleeping more. Had significant diarrhea this morning, no blood per EMS report. Mild tachycardia with EMS (~102). No hypotension. No reported fevers.  ? ?After initial evaluation, daughter and her husband arrived and provided more history.  They report that the patient's been having "UTI symptoms" for 2 weeks.  This includes more agitation than typical as well as a foul and strong urine odor.  I have been talking with her doctor who has recommended increased water and cranberry juice.  She has been dealing with constipation and gave her multiple medicines which included an enema, MiraLAX and then Senokot.  Over the last 24 hours she has had significant amount of diarrhea now though no blood.  This morning she was hardly responsive at all and while she is better now she is still not quite to her baseline.  That was the primary reason for the EMS call. ? ?Home Medications ?Prior to Admission medications   ?Medication Sig Start Date End Date Taking? Authorizing Provider  ?acetaminophen (TYLENOL) 160 MG/5ML liquid Take 320 mg by mouth every 4 (four) hours as needed for fever.   Yes [provider]  ?diclofenac Sodium (VOLTAREN) 1 % GEL Apply 2 g topically daily as needed (pain).   Yes [provider]  ?haloperidol (HALDOL) 0.5 MG tablet Take 0.5 mg by mouth daily.   Yes [provider]  ?hydrochlorothiazide (MICROZIDE) 12.5 MG capsule Take  12.5 mg by mouth daily.   Yes [provider]  ?QUEtiapine (SEROQUEL) 25 MG tablet Take 12.5 mg by mouth at bedtime.   Yes [provider]  ?senna (SENOKOT) 8.6 MG tablet Take 1 tablet by mouth daily as needed for constipation.   Yes [provider]  ?sennosides-docusate sodium (SENOKOT-S) 8.6-50 MG tablet Take 1 tablet by mouth daily as needed for constipation.   Yes [provider]  ?cephALEXin (KEFLEX) 500 MG capsule Take 1 capsule (500 mg total) by mouth 4 (four) times daily. ?Patient not taking: Reported on 06/09/2021 11/04/19   Charlesetta Shanks, MD  ?ciprofloxacin (CIPRO) 250 MG tablet Take 1 tablet (250 mg total) by mouth every 12 (twelve) hours. ?Patient not taking: Reported on 06/09/2021 07/04/17   Julianne Rice, MD  ?folic acid (FOLVITE) 1 MG tablet TAKE 2 TABLETS(2 MG) BY MOUTH DAILY ?Patient not taking: Reported on 06/09/2021 09/03/17   Volanda Napoleon, MD  ?   ? ?Allergies    ?Ampicillin, Bactrim [sulfamethoxazole-trimethoprim], Celebrex [celecoxib], Donepezil, Penicillins, and Polysporin [bacitracin-polymyxin b]   ? ?Review of Systems   ?Review of Systems  ?Unable to perform ROS: Dementia  ? ?Physical Exam ?Updated Vital Signs ?BP 93/61   Pulse 73   Temp 97.8 ?F (36.6 ?C) (Oral)   Resp 13   SpO2 94%  ?Physical Exam ?Vitals and nursing note reviewed.  ?Constitutional:   ?   Appearance: She is well-developed. She is not ill-appearing or diaphoretic.  ?HENT:  ?  Head: Normocephalic and atraumatic.  ?Cardiovascular:  ?   Rate and Rhythm: Normal rate and regular rhythm.  ?   Heart sounds: Normal heart sounds.  ?Pulmonary:  ?   Effort: Pulmonary effort is normal.  ?   Breath sounds: Normal breath sounds.  ?Abdominal:  ?   General: There is no distension.  ?   Palpations: Abdomen is soft.  ?   Tenderness: There is no abdominal tenderness.  ?Skin: ?   General: Skin is warm and dry.  ?Neurological:  ?   Mental Status: She is alert.  ? ? ?ED Results / Procedures / Treatments    ?Labs ?(all labs ordered are listed, but only abnormal results are displayed) ?Labs Reviewed  ?URINALYSIS, ROUTINE W REFLEX MICROSCOPIC - Abnormal; Notable for the following components:  ?    Result Value  ? Color, Urine AMBER (*)   ? APPearance CLOUDY (*)   ? Hgb urine dipstick MODERATE (*)   ? Protein, ur 100 (*)   ? Leukocytes,Ua LARGE (*)   ? RBC / HPF >50 (*)   ? WBC, UA >50 (*)   ? Bacteria, UA MANY (*)   ? All other components within normal limits  ?COMPREHENSIVE METABOLIC PANEL - Abnormal; Notable for the following components:  ? Glucose, Bld 152 (*)   ? BUN 30 (*)   ? Total Protein 6.4 (*)   ? Albumin 3.1 (*)   ? AST 53 (*)   ? All other components within normal limits  ?CBC WITH DIFFERENTIAL/PLATELET - Abnormal; Notable for the following components:  ? WBC 16.2 (*)   ? Hemoglobin 9.8 (*)   ? HCT 29.6 (*)   ? MCV 73.6 (*)   ? MCH 24.4 (*)   ? RDW 17.2 (*)   ? Platelets 90 (*)   ? Neutro Abs 14.7 (*)   ? Lymphs Abs 0.5 (*)   ? Abs Immature Granulocytes 0.15 (*)   ? All other components within normal limits  ?CBG MONITORING, ED - Abnormal; Notable for the following components:  ? Glucose-Capillary 141 (*)   ? All other components within normal limits  ?RESP PANEL BY RT-PCR (FLU A&B, COVID) ARPGX2  ?C DIFFICILE QUICK SCREEN W PCR REFLEX    ?GASTROINTESTINAL PANEL BY PCR, STOOL (REPLACES STOOL CULTURE)  ?URINE CULTURE  ?LIPASE, BLOOD  ? ? ?EKG ?EKG Interpretation ? ?Date/Time:  Thursday June 09 2021 16:25:12 EDT ?Ventricular Rate:  103 ?PR Interval:  174 ?QRS Duration: 104 ?QT Interval:  349 ?QTC Calculation: 457 ?R Axis:   -15 ?Text Interpretation: Sinus tachycardia Borderline left axis deviation Nonspecific T abnormalities, lateral leads Artifact in lead(s) I II III aVR aVL aVF V1 V2 V3 V4 V5 V6 Confirmed by Sherwood Gambler [phone]) on 06/09/2021 11:06:57 PM ? ?Radiology ?CT Head Wo Contrast ? ?Result Date: 06/09/2021 ?CLINICAL DATA:  Provided history: Mental status change, unknown cause. Additional history  provided: History of Alzheimer's disease. Diarrhea. Lethargy. EXAM: CT HEAD WITHOUT CONTRAST TECHNIQUE: Contiguous axial images were obtained from the base of the skull through the vertex without intravenous contrast. RADIATION DOSE REDUCTION: This exam was performed according to the departmental dose-optimization program which includes automated exposure control, adjustment of the mA and/or kV according to patient size and/or use of iterative reconstruction technique. COMPARISON:  No pertinent prior exams available for comparison. FINDINGS: Mildly motion degraded exam. Brain: Mild-to-moderate cerebral atrophy. Advanced patchy and confluent hypoattenuation within the cerebral white matter, nonspecific but compatible with chronic small vessel ischemic disease.  10 mm age-indeterminate infarct within the medial right cerebellar hemisphere (series 4, image 46) (series 2, images 11 and 12). There is no acute intracranial hemorrhage. No demarcated cortical infarct. No extra-axial fluid collection. No evidence of an intracranial mass. No midline shift. Vascular: No hyperdense vessel. Atherosclerotic calcifications. Skull: Normal. Negative for fracture or focal lesion. Sinuses/Orbits: Visualized orbits show no acute finding. Small mucous retention cyst within the right sphenoid sinus. IMPRESSION: 10 mm infarct within the medial right cerebellar hemisphere, age-indeterminate but favored chronic. A brain MRI may be obtained for further evaluation, as clinically warranted. No acute cortical infarct or evidence of acute intracranial hemorrhage. Advanced chronic small vessel ischemic changes within the cerebral white matter. Mild-to-moderate cerebral atrophy. Electronically Signed   By: Kellie Simmering D.O.   On: 06/09/2021 17:01  ? ?MR BRAIN WO CONTRAST ? ?Result Date: 06/09/2021 ?CLINICAL DATA:  Neuro deficit, acute, stroke suspected EXAM: MRI HEAD WITHOUT CONTRAST TECHNIQUE: Multiplanar, multiecho pulse sequences of the brain and  surrounding structures were obtained without intravenous contrast. COMPARISON:  None. FINDINGS: Brain: There is no acute infarction or intracranial hemorrhage. There is no intracranial mass, mass effect, o

## 2021-06-09 NOTE — H&P (Signed)
?History and Physical  ? ? ?Patient: Tara Dixon CHE:[id number] DOB: 1931/10/08 ?DOA: 06/09/2021 ?DOS: the patient was seen and examined on 06/10/2021 ?PCP: Clovia Cuff, MD  ?Patient coming from: Home ? ?Chief Complaint:  ?Chief Complaint  ?Patient presents with  ? Diarrhea  ? Fatigue  ? ?HPI: Tara Dixon is a 88 y.o. female with medical history significant of Alzheimer's dementia, hypertension and chronic diastolic heart failure who presents with altered mental status. ? ?Patient was lethargic on exam and history is provided by daughter and son-in-law at bedside. ?Patient at baseline is sometimes able to recognize self and family member but otherwise not oriented to time recurrent event.  For the past several days daughter has noticed increasing agitation which usually occurs when she has UTI.  Today she became more lethargic and not speaking as much and decides who bring her to the ED. ?She has also been dealing with constipation for the past 2 weeks.  Daughter has been trying different regimens including senna, MiraLAX and Fleet enema.  For the past 2 days she has actually been having diarrhea. ? ?In the ED, she was afebrile normotensive on room air.  Leukocytosis of 16.2, hemoglobin 9.4 around her baseline of 10-11, platelet of 90 with previous platelets a year ago around 127. ?CMP with CBG 152, AST of 53, ALT of 38. ?Negative flu/COVID PCR ? ?UA showing large leuk, negative nitrite and many bacteria. ? ?CT head with 10 mm infarct to medial right cerebellar hemisphere likely is chronic.  MRI of the brain pending at the time of admission. ? ?She was started on IV Rocephin and hospitalist was consulted for further management of AMS. ? ?Review of Systems: unable to review all systems due to the inability of the patient to answer questions. ?Past Medical History:  ?Diagnosis Date  ? Allergy   ? Alzheimer disease ([place])   ? Anemia   ? Arthritis   ? Hypertension   ? ?Past Surgical History:  ?Procedure Laterality  Date  ? ABDOMINAL HYSTERECTOMY    ? ROTATOR CUFF REPAIR Right   ? ?Social History:  reports that she has never smoked. She has never used smokeless tobacco. No history on file for alcohol use and drug use. ? ?Allergies  ?Allergen Reactions  ? Ampicillin Other (See Comments)  ?  unknown  ? Bactrim [Sulfamethoxazole-Trimethoprim] Other (See Comments)  ?  unknown  ? Celebrex [Celecoxib] Other (See Comments)  ?  unknown  ? Donepezil   ?  Other reaction(s): intolerant  ? Penicillins Other (See Comments)  ?  unknown  ? Polysporin [Bacitracin-Polymyxin B] Other (See Comments)  ?  unknown  ? ? ?Family History  ?Problem Relation Age of Onset  ? Hypertension Mother   ? Stroke Mother   ? Hypertension Father   ? Stroke Father   ? Hypertension Sister   ? ? ?Prior to Admission medications   ?Medication Sig Start Date End Date Taking? Authorizing Provider  ?amLODipine (NORVASC) 5 MG tablet Take 5 mg by mouth daily.    [provider]  ?Calcium Carb-Cholecalciferol (CALCIUM + D3 PO) Take by mouth.    [provider]  ?cephALEXin (KEFLEX) 500 MG capsule Take 1 capsule (500 mg total) by mouth 4 (four) times daily. 11/04/19   Charlesetta Shanks, MD  ?ciprofloxacin (CIPRO) 250 MG tablet Take 1 tablet (250 mg total) by mouth every 12 (twelve) hours. 07/04/17   Julianne Rice, MD  ?folic acid (FOLVITE) 1 MG tablet TAKE 2 TABLETS(2 MG) BY MOUTH  DAILY 09/03/17   Volanda Napoleon, MD  ?hydrochlorothiazide (MICROZIDE) 12.5 MG capsule Take 12.5 mg by mouth daily.    [provider]  ?memantine (NAMENDA XR) 28 MG CP24 24 hr capsule Take 28 mg by mouth. 06/28/15   [provider]  ?Multiple Vitamins-Minerals (CENTRUM ADULTS PO) Take by mouth.    [provider]  ?Omega-3 Fatty Acids (OMEGA 3 PO) Take by mouth.    [provider]  ? ? ?Physical Exam: ?Vitals:  ? 06/09/21 2300 06/09/21 2345 06/10/21 0000 06/10/21 0100  ?BP: 93/61 110/69 (!) 138/119 (!) 148/85  ?Pulse: 73 77 86 89  ?Resp: '13 14 19 19   '$ ?Temp:      ?TempSrc:      ?SpO2: 94% 95% 96% 98%  ? ?Constitutional: NAD, calm, comfortable, elderly female laying in bed asleep. ?Eyes: PERRL, lids and conjunctivae normal ?ENMT: Mucous membranes are moist.  ?Neck: normal, supple ?Respiratory: clear to auscultation bilaterally, no wheezing, no crackles. Normal respiratory effort. No accessory muscle use.  ?Cardiovascular: Regular rate and rhythm, no murmurs / rubs / gallops. No extremity edema. 2+ pedal pulses. No carotid bruits.  ?Abdomen: no grimace with palpation of the abdomen, Bowel sounds positive.  ?Musculoskeletal: no clubbing / cyanosis. No joint deformity upper and lower extremities. Good ROM, no contractures. Normal muscle tone.  ?Skin: no rashes, lesions, ulcers.  ?Neurologic: Remains asleep throughout exam but does respond to voice.  Refused to open eyes and does not follow commands.  No facial asymmetry.  Unable to assess for extremity strength since patient not cooperative with exam. ?psychiatric: Unable to assess  ?data Reviewed: ? ?See HPI ? ?Assessment and Plan: ?* AMS (altered mental status) ?Secondary to UTI. Treatment as below ? ?Sepsis secondary to UTI [facility]) ?Presented with HR >90 with leukocytosis and positive UA ?-continue IV Rocephin ?-not given full 30cc/kg fluid since she is hemodynamically stable and has hx of diastolic dysfunction  ? ? ?Diarrhea ?Several days of diarrhea following 2 weeks of constipation. Obtain Abdominal X-ray to assess for stool burden.  ? ?HTN (hypertension) ?Hold HCTZ. Daughter has only been giving it when SBP >120.  ? ?Dementia with behavioral disturbance ([place]) ?Continue home haloperidol and seroquel ? ?History of CVA (cerebrovascular accident) ?Infarct of the medial right cerebellum noted on CT head. MRI brain shows it is chronic.  ?Start aspirin ?Check Lipid and HbA1C  ? ? ? ? ? Advance Care Planning:   Code Status: Full Code  ? ?Consults: None ? ?Family Communication: Discussed with daughter and son-in-law at  bedside ? ?Severity of Illness: ?The appropriate patient status for this patient is OBSERVATION. Observation status is judged to be reasonable and necessary in order to provide the required intensity of service to ensure the patient's safety. The patient's presenting symptoms, physical exam findings, and initial radiographic and laboratory data in the context of their medical condition is felt to place them at decreased risk for further clinical deterioration. Furthermore, it is anticipated that the patient will be medically stable for discharge from the hospital within 2 midnights of admission.  ? ?Author: Orene Desanctis, DO ?06/10/2021 1:08 AM ? ?For on call review [email or web address].  ?

## 2021-06-10 ENCOUNTER — Other Ambulatory Visit: Payer: Self-pay

## 2021-06-10 ENCOUNTER — Observation Stay (HOSPITAL_COMMUNITY): Payer: Medicare Other

## 2021-06-10 DIAGNOSIS — Z8673 Personal history of transient ischemic attack (TIA), and cerebral infarction without residual deficits: Secondary | ICD-10-CM | POA: Diagnosis not present

## 2021-06-10 DIAGNOSIS — M160 Bilateral primary osteoarthritis of hip: Secondary | ICD-10-CM | POA: Diagnosis not present

## 2021-06-10 DIAGNOSIS — Z20822 Contact with and (suspected) exposure to covid-19: Secondary | ICD-10-CM | POA: Diagnosis present

## 2021-06-10 DIAGNOSIS — Z90710 Acquired absence of both cervix and uterus: Secondary | ICD-10-CM | POA: Diagnosis not present

## 2021-06-10 DIAGNOSIS — I5032 Chronic diastolic (congestive) heart failure: Secondary | ICD-10-CM | POA: Diagnosis present

## 2021-06-10 DIAGNOSIS — R4182 Altered mental status, unspecified: Secondary | ICD-10-CM | POA: Diagnosis not present

## 2021-06-10 DIAGNOSIS — D696 Thrombocytopenia, unspecified: Secondary | ICD-10-CM | POA: Diagnosis present

## 2021-06-10 DIAGNOSIS — F03918 Unspecified dementia, unspecified severity, with other behavioral disturbance: Secondary | ICD-10-CM | POA: Diagnosis not present

## 2021-06-10 DIAGNOSIS — R5381 Other malaise: Secondary | ICD-10-CM | POA: Diagnosis not present

## 2021-06-10 DIAGNOSIS — R627 Adult failure to thrive: Secondary | ICD-10-CM | POA: Diagnosis present

## 2021-06-10 DIAGNOSIS — A419 Sepsis, unspecified organism: Secondary | ICD-10-CM

## 2021-06-10 DIAGNOSIS — F02811 Dementia in other diseases classified elsewhere, unspecified severity, with agitation: Secondary | ICD-10-CM | POA: Diagnosis present

## 2021-06-10 DIAGNOSIS — Z7401 Bed confinement status: Secondary | ICD-10-CM | POA: Diagnosis not present

## 2021-06-10 DIAGNOSIS — Z79899 Other long term (current) drug therapy: Secondary | ICD-10-CM | POA: Diagnosis not present

## 2021-06-10 DIAGNOSIS — G9340 Encephalopathy, unspecified: Secondary | ICD-10-CM | POA: Diagnosis present

## 2021-06-10 DIAGNOSIS — I110 Hypertensive heart disease with heart failure: Secondary | ICD-10-CM | POA: Diagnosis present

## 2021-06-10 DIAGNOSIS — N390 Urinary tract infection, site not specified: Secondary | ICD-10-CM | POA: Diagnosis present

## 2021-06-10 DIAGNOSIS — F05 Delirium due to known physiological condition: Secondary | ICD-10-CM | POA: Diagnosis present

## 2021-06-10 DIAGNOSIS — M1990 Unspecified osteoarthritis, unspecified site: Secondary | ICD-10-CM | POA: Diagnosis present

## 2021-06-10 DIAGNOSIS — E162 Hypoglycemia, unspecified: Secondary | ICD-10-CM | POA: Diagnosis not present

## 2021-06-10 DIAGNOSIS — G309 Alzheimer's disease, unspecified: Secondary | ICD-10-CM | POA: Diagnosis present

## 2021-06-10 DIAGNOSIS — Z515 Encounter for palliative care: Secondary | ICD-10-CM | POA: Diagnosis not present

## 2021-06-10 DIAGNOSIS — R197 Diarrhea, unspecified: Secondary | ICD-10-CM | POA: Diagnosis present

## 2021-06-10 DIAGNOSIS — Z6824 Body mass index (BMI) 24.0-24.9, adult: Secondary | ICD-10-CM | POA: Diagnosis not present

## 2021-06-10 DIAGNOSIS — I10 Essential (primary) hypertension: Secondary | ICD-10-CM

## 2021-06-10 DIAGNOSIS — R32 Unspecified urinary incontinence: Secondary | ICD-10-CM | POA: Diagnosis present

## 2021-06-10 DIAGNOSIS — B961 Klebsiella pneumoniae [K. pneumoniae] as the cause of diseases classified elsewhere: Secondary | ICD-10-CM | POA: Diagnosis present

## 2021-06-10 DIAGNOSIS — Z7189 Other specified counseling: Secondary | ICD-10-CM | POA: Diagnosis not present

## 2021-06-10 DIAGNOSIS — R031 Nonspecific low blood-pressure reading: Secondary | ICD-10-CM | POA: Diagnosis not present

## 2021-06-10 DIAGNOSIS — K6389 Other specified diseases of intestine: Secondary | ICD-10-CM | POA: Diagnosis not present

## 2021-06-10 DIAGNOSIS — G9341 Metabolic encephalopathy: Secondary | ICD-10-CM

## 2021-06-10 DIAGNOSIS — A4159 Other Gram-negative sepsis: Secondary | ICD-10-CM | POA: Diagnosis present

## 2021-06-10 DIAGNOSIS — Z66 Do not resuscitate: Secondary | ICD-10-CM | POA: Diagnosis present

## 2021-06-10 DIAGNOSIS — K5900 Constipation, unspecified: Secondary | ICD-10-CM | POA: Diagnosis present

## 2021-06-10 DIAGNOSIS — I700 Atherosclerosis of aorta: Secondary | ICD-10-CM | POA: Diagnosis not present

## 2021-06-10 LAB — COMPREHENSIVE METABOLIC PANEL
ALT: 32 U/L (ref 0–44)
AST: 39 U/L (ref 15–41)
Albumin: 2.8 g/dL — ABNORMAL LOW (ref 3.5–5.0)
Alkaline Phosphatase: 78 U/L (ref 38–126)
Anion gap: 6 (ref 5–15)
BUN: 30 mg/dL — ABNORMAL HIGH (ref 8–23)
CO2: 26 mmol/L (ref 22–32)
Calcium: 9 mg/dL (ref 8.9–10.3)
Chloride: 109 mmol/L (ref 98–111)
Creatinine, Ser: 0.72 mg/dL (ref 0.44–1.00)
GFR, Estimated: >60 mL/min (ref >60)
Glucose, Bld: 94 mg/dL (ref 70–99)
Potassium: 3.9 mmol/L (ref 3.5–5.1)
Sodium: 141 mmol/L (ref 135–145)
Total Bilirubin: 0.6 mg/dL (ref 0.3–1.2)
Total Protein: 6 g/dL — ABNORMAL LOW (ref 6.5–8.1)

## 2021-06-10 LAB — CBC
HCT: 26.2 % — ABNORMAL LOW (ref 36.0–46.0)
Hemoglobin: 8.7 g/dL — ABNORMAL LOW (ref 12.0–15.0)
MCH: 24.3 pg — ABNORMAL LOW (ref 26.0–34.0)
MCHC: 33.2 g/dL (ref 30.0–36.0)
MCV: 73.2 fL — ABNORMAL LOW (ref 80.0–100.0)
Platelets: 74 10*3/uL — ABNORMAL LOW (ref 150–400)
RBC: 3.58 MIL/uL — ABNORMAL LOW (ref 3.87–5.11)
RDW: 17 % — ABNORMAL HIGH (ref 11.5–15.5)
WBC: 17.1 10*3/uL — ABNORMAL HIGH (ref 4.0–10.5)
nRBC: 0 % (ref 0.0–0.2)

## 2021-06-10 LAB — LIPID PANEL
Cholesterol: 160 mg/dL (ref 0–200)
HDL: 70 mg/dL (ref >40)
LDL Cholesterol: 83 mg/dL (ref 0–99)
Total CHOL/HDL Ratio: 2.3 RATIO
Triglycerides: 35 mg/dL (ref <150)
VLDL: 7 mg/dL (ref 0–40)

## 2021-06-10 LAB — HEMOGLOBIN A1C
Hgb A1c MFr Bld: 5.6 % (ref 4.8–5.6)
Mean Plasma Glucose: 114.02 mg/dL

## 2021-06-10 MED ORDER — SODIUM CHLORIDE 0.9 % IV SOLN: 1.0000 g | INTRAVENOUS | Status: DC

## 2021-06-10 MED ORDER — SODIUM CHLORIDE 0.9 % IV BOLUS
500.0000 mL | Freq: Once | INTRAVENOUS | Status: AC
  Administered 2021-06-10: 500 mL via INTRAVENOUS

## 2021-06-10 MED ORDER — QUETIAPINE FUMARATE 25 MG PO TABS
12.5000 mg | ORAL_TABLET | Freq: Every day | ORAL | Status: DC
  Administered 2021-06-10 – 2021-06-12 (×3): 12.5 mg via ORAL
  Filled 2021-06-10 (×3): qty 1

## 2021-06-10 MED ORDER — ASPIRIN EC 81 MG PO TBEC
81.0000 mg | DELAYED_RELEASE_TABLET | Freq: Every day | ORAL | Status: DC
  Filled 2021-06-10: qty 1

## 2021-06-10 MED ORDER — SODIUM CHLORIDE 0.9 % IV SOLN
1.0000 g | INTRAVENOUS | Status: DC
  Administered 2021-06-10 – 2021-06-11 (×2): 1 g via INTRAVENOUS
  Filled 2021-06-10 (×2): qty 10

## 2021-06-10 MED ORDER — ACETAMINOPHEN 325 MG PO TABS
650.0000 mg | ORAL_TABLET | Freq: Four times a day (QID) | ORAL | Status: DC | PRN
  Administered 2021-06-10 – 2021-06-12 (×3): 650 mg via ORAL
  Filled 2021-06-10 (×3): qty 2

## 2021-06-10 MED ORDER — ENSURE ENLIVE PO LIQD
237.0000 mL | Freq: Two times a day (BID) | ORAL | Status: DC
  Administered 2021-06-11 – 2021-06-12 (×4): 237 mL via ORAL

## 2021-06-10 MED ORDER — HYDROCHLOROTHIAZIDE 12.5 MG PO TABS: 12.5000 mg | ORAL_TABLET | Freq: Every day | ORAL | Status: DC

## 2021-06-10 MED ORDER — ENOXAPARIN SODIUM 30 MG/0.3ML IJ SOSY: 30.0000 mg | PREFILLED_SYRINGE | INTRAMUSCULAR | Status: DC

## 2021-06-10 MED ORDER — SENNOSIDES-DOCUSATE SODIUM 8.6-50 MG PO TABS
1.0000 | ORAL_TABLET | Freq: Every day | ORAL | Status: DC | PRN
  Administered 2021-06-12: 1 via ORAL
  Filled 2021-06-10: qty 1

## 2021-06-10 MED ORDER — HALOPERIDOL 0.5 MG PO TABS
0.5000 mg | ORAL_TABLET | Freq: Every day | ORAL | Status: DC
  Administered 2021-06-10 – 2021-06-13 (×4): 0.5 mg via ORAL
  Filled 2021-06-10 (×4): qty 1

## 2021-06-10 MED ORDER — ADULT MULTIVITAMIN W/MINERALS CH
1.0000 | ORAL_TABLET | Freq: Every day | ORAL | Status: DC
  Administered 2021-06-11 – 2021-06-13 (×3): 1 via ORAL
  Filled 2021-06-10 (×3): qty 1

## 2021-06-10 NOTE — ED Notes (Signed)
Daughter Antony Contras (daughter) updated regarding pt pending bed assignment upstairs, daughter can be reached at [phone] ?

## 2021-06-10 NOTE — Progress Notes (Signed)
?PROGRESS NOTE ?Tara Dixon  PIR:[id number] DOB: 03-12-1931 DOA: 06/09/2021 ?PCP: Clovia Cuff, MD  ? ?Brief Narrative/Hospital Course: ? 88 y.o. fe W/ Alzheimer's dementia, hypertension and chronic diastolic heart failure who presents with altered mental status. In ED lethargic , daughter and son-in-law at bedside helped with history baseline is sometimes able to recognize self and family member but otherwise not oriented to time recurrent event but for the past several days daughter has noticed increasing agitation which usually occurs when she has UTI. ?For more lethargic brought to the ED  and also dealing with constipation for the past 2 weeks.  Daughter has been trying different regimens including senna, MiraLAX and Fleet enema. For the past 2 days she has actually been having diarrhea. ?  ?In the ED, she was afebrile normotensive on room air.  Leukocytosis of 16.2, hemoglobin 9.4 around her baseline of 10-11, platelet of 90 with previous platelets a year ago around 127. CMP with CBG 152, AST of 53, ALT of 38. ?Negative flu/COVID PCR. UA showing large leuk, negative nitrite and many bacteria. ?CT head with 10 mm infarct to medial right cerebellar hemisphere likely is chronic.  MRI of the brain pending at the time of admission. ?She was started on IV Rocephin and hospitalist was consulted for further management of AMS.  ?  ?Subjective: ?Seen this morning she is alert awake able to tell me her name unable to answer rest of the questions, appears confused.  On room air blood pressure stable overnight no fever ?Assessment and Plan: ?Principal Problem: ?  Acute metabolic encephalopathy ?Active Problems: ?  Sepsis secondary to UTI [facility]) ?  History of CVA (cerebrovascular accident) ?  Dementia with behavioral disturbance ([place]) ?  HTN (hypertension) ?  Diarrhea ?  Physical deconditioning ?  Failure to thrive in adult ?  ?Acute metabolic encephalopathy ?Dementia with behavioral disturbance: ?Baseline dementia with  worsening of mental status in the setting of UTI.  Continue UTI treatment delirium precaution fall precaution, Haldol Seroquel and supportive care.  Follow-up urine culture. ? ?Sepsis secondary to UTI poa: On admission tachycardic heart rate more than 90 leukocytosis with UTI.  Continue ceftriaxone follow-up urine culture.   ? ?Diarrhea:Several days of diarrhea following 2 weeks of constipation. X-ray abd-no acute finding.  ? ?HTN: At home getting HCTZ for SBP more than 120.  Continue to hold HCTZ. ? ?History of CVA: CT-Infarct of the medial right cerebellum noted on CT head. MRI brain shows it is chronic.  ?Cont asa.  Lipid panel with LDL 83 stable.  A1c normal. ? ?Deconditioning/debility/failure to thrive dietitian consulted.  She is full code consult palliative care. ? ?DVT prophylaxis: Place and maintain sequential compression device Start: 06/10/21 0053 ?Code Status:   Code Status: Full Code ?Family Communication: plan of care discussed with patient at bedside. ?Patient status is: Inpatient level of care: Telemetry  ?Remains inpatient because: Ongoing management of encephalopathy and UTI ?Patient currently not stable ? ?Dispo: The patient is from: Home ?           Anticipated disposition: home ? ?Mobility Assessment (last 72 hours)   ? ? Mobility Assessment   ?No documentation. ? ?  ?  ? ?  ?  ? ?Objective: ?Vitals last 24 hrs: ?Vitals:  ? 06/10/21 0700 06/10/21 0800 06/10/21 0900 06/10/21 1000  ?BP: 138/68 (!) 141/69 135/66 134/71  ?Pulse: 90 93 88 90  ?Resp: '16 19 14 15  '$ ?Temp:      ?TempSrc:      ?  SpO2: 96% 94% 96% 96%  ? ?Weight change:  ? ?Physical Examination: ?General exam: Alert awake oriented to self,on RA ?HEENT:Oral mucosa moist, Ear/Nose WNL grossly, dentition normal. ?Respiratory system: bilaterally diminished BS, no use of accessory muscle ?Cardiovascular system: S1 & S2 +, No JVD,. ?Gastrointestinal system: Abdomen soft,NT,ND, BS+ ?Nervous System:Alert, awake, moving extremities and grossly  nonfocal ?Extremities: LE edema none,distal peripheral pulses palpable.  ?Skin: No rashes,no icterus. ?MSK: Normal muscle bulk,tone, power ? ?Medications reviewed:  ?Scheduled Meds: ? [START ON 06/11/2021] aspirin EC  81 mg Oral Daily  ? haloperidol  0.5 mg Oral Daily  ? QUEtiapine  12.5 mg Oral QHS  ? ?Continuous Infusions: ? cefTRIAXone (ROCEPHIN)  IV    ? ? ?  ?Diet Order   ? ?       ?  Diet regular Room service appropriate? Yes; Fluid consistency: Thin  Diet effective now       ?  ? ?  ?  ? ?  ?  ? ?  ?  ?  ? ? ?Intake/Output Summary (Last 24 hours) at 06/10/2021 1122 ?Last data filed at 06/13/21 1827 ?Gross per 24 hour  ?Intake --  ?Output 200 ml  ?Net -200 ml  ? ?Net IO Since Admission: -200 mL [06/10/21 1122]  ?Wt Readings from Last 3 Encounters:  ?11/04/19 77.6 kg  ?07/03/17 77.6 kg  ?08/09/16 77.6 kg  ?  ? ?Unresulted Labs (From admission, onward)  ? ?  Start     Ordered  ? 06-13-21 1948  Urine Culture  Once,   URGENT       ?Question:  Indication  Answer:  Dysuria  ? 13-Jun-2021 1947  ? 06/13/2021 1551  C Difficile Quick Screen w PCR reflex  (C Difficile quick screen w PCR reflex panel )  Once, for 24 hours,   URGENT       ?References:    CDiff Information Tool  ? 2021/06/13 1550  ? Jun 13, 2021 1551  Gastrointestinal Panel by PCR , Stool  (Gastrointestinal Panel by PCR, Stool                                                                                                                                                     **Does Not include CLOSTRIDIUM DIFFICILE testing. **If CDIFF testing is needed, place order from the "C Difficile Testing" order set.**)  Once,   URGENT       ? 06-13-2021 1550  ? ?  ?  ? ?  ?Data Reviewed: I have personally reviewed following labs and imaging studies ?CBC: ?Recent Labs  ?Lab 06-13-2021 ?1550 06/10/21 ?0500  ?WBC 16.2* 17.1*  ?NEUTROABS 14.7*  --   ?HGB 9.8* 8.7*  ?HCT 29.6* 26.2*  ?MCV 73.6* 73.2*  ?PLT 90* 74*  ? ?Basic Metabolic Panel: ?Recent Labs  ?Lab 2021-06-13 ?1550 06/10/21 ?  0500   ?NA 141 141  ?K 3.6 3.9  ?CL 107 109  ?CO2 26 26  ?GLUCOSE 152* 94  ?BUN 30* 30*  ?CREATININE 0.86 0.72  ?CALCIUM 9.4 9.0  ? ?GFR: ?CrCl cannot be calculated (Unknown ideal weight.). ?Liver Function Tests: ?Recent Labs  ?Lab 06/09/21 ?1550 06/10/21 ?0500  ?AST 53* 39  ?ALT 38 32  ?ALKPHOS 89 78  ?BILITOT 0.6 0.6  ?PROT 6.4* 6.0*  ?ALBUMIN 3.1* 2.8*  ? ?Recent Labs  ?Lab 06/09/21 ?1550  ?LIPASE 23  ? ?No results for input(s): AMMONIA in the last 168 hours. ?Coagulation Profile: ?No results for input(s): INR, PROTIME in the last 168 hours. ?BNP (last 3 results) ?No results for input(s): PROBNP in the last 8760 hours. ?HbA1C: ?Recent Labs  ?  06/10/21 ?0500  ?HGBA1C 5.6  ? ?CBG: ?Recent Labs  ?Lab 06/09/21 ?1630  ?GLUCAP 141*  ? ?Lipid Profile: ?Recent Labs  ?  06/10/21 ?0500  ?CHOL 160  ?HDL 70  ?[place] 83  ?TRIG 35  ?CHOLHDL 2.3  ? ?Thyroid Function Tests: ?No results for input(s): TSH, T4TOTAL, FREET4, T3FREE, THYROIDAB in the last 72 hours. ?Sepsis Labs: ?No results for input(s): PROCALCITON, LATICACIDVEN in the last 168 hours. ? ?Recent Results (from the past 240 hour(s))  ?Resp Panel by RT-PCR (Flu A&B, Covid) Nasopharyngeal Swab     Status: None  ? Collection Time: 06/09/21  4:25 PM  ? Specimen: Nasopharyngeal Swab; Nasopharyngeal(NP) swabs in vial transport medium  ?Result Value Ref Range Status  ? SARS Coronavirus 2 by RT PCR NEGATIVE NEGATIVE Final  ?  Comment: (NOTE) ?SARS-CoV-2 target nucleic acids are NOT DETECTED. ? ?The SARS-CoV-2 RNA is generally detectable in upper respiratory ?specimens during the acute phase of infection. The lowest ?concentration of SARS-CoV-2 viral copies this assay can detect is ?138 copies/mL. A negative result does not preclude SARS-Cov-2 ?infection and should not be used as the sole basis for treatment or ?other patient management decisions. A negative result may occur with  ?improper specimen collection/handling, submission of specimen other ?than nasopharyngeal swab,  presence of viral mutation(s) within the ?areas targeted by this assay, and inadequate number of viral ?copies(<138 copies/mL). A negative result must be combined with ?clinical observations, patient history, and e

## 2021-06-10 NOTE — Assessment & Plan Note (Signed)
Secondary to UTI. Treatment as below ?

## 2021-06-10 NOTE — Hospital Course (Addendum)
88 y.o. fe W/ Alzheimer's dementia, hypertension and chronic diastolic heart failure who presents with altered mental status. In ED lethargic , daughter and son-in-law at bedside helped with history baseline is sometimes able to recognize self and family member but otherwise not oriented to time recurrent event but for the past several days daughter has noticed increasing agitation which usually occurs when she has UTI. She was more lethartic also dealing with constipation for the past 2 weeks.  Daughter has been trying different regimens including senna, MiraLAX and Fleet enema. For the past 2 days she has actually been having diarrhea. ?  ?In the ED, she was afebrile normotensive on room air.  Leukocytosis of 16.2, hemoglobin 9.4 around her baseline of 10-11, platelet of 90 with previous platelets a year ago around 127. CMP with CBG 152, AST of 53, ALT of 38. ?Negative flu/COVID PCR. UA showing large leuk, negative nitrite and many bacteria. ?CT head with 10 mm infarct to medial right cerebellar hemisphere likely is chronic.  MRI of the brain pending at the time of admission. ?She was started on IV Rocephin and hospitalist was consulted for further management of AMS. ?

## 2021-06-10 NOTE — ED Notes (Signed)
Family leaving bedside at this time. Request call back with any changes.  ?

## 2021-06-10 NOTE — Assessment & Plan Note (Signed)
Hold HCTZ. Daughter has only been giving it when SBP >120.  ?

## 2021-06-10 NOTE — Assessment & Plan Note (Signed)
Presented with HR >90 with leukocytosis and positive UA ?-continue IV Rocephin ?-not given full 30cc/kg fluid since she is hemodynamically stable and has hx of diastolic dysfunction  ? ?

## 2021-06-10 NOTE — ED Notes (Signed)
Pts daughter at bedside  

## 2021-06-10 NOTE — Assessment & Plan Note (Signed)
Several days of diarrhea following 2 weeks of constipation. Obtain Abdominal X-ray to assess for stool burden.  ?

## 2021-06-10 NOTE — TOC Initial Note (Signed)
Transition of Care (TOC) - Initial/Assessment Note  ? ? ?Patient Details  ?Name: Tara Dixon ?MRN: [id number] ?Date of Birth: 1932-02-18 ? ?Transition of Care [facility]) CM/SW Contact:    ?Dessa Phi, RN ?Phone Number: ?06/10/2021, 3:01 PM ? ?Clinical Narrative:  Monitor for d/c plans               ? ? ?Expected Discharge Plan: Home/Self Care ?Barriers to Discharge: Continued Medical Work up ? ? ?Patient Goals and CMS Choice ?Patient states their goals for this hospitalization and ongoing recovery are:: Home ?CMS Medicare.gov Compare Post Acute Care list provided to:: Patient Represenative (must comment) [name] dtr) ?Choice offered to / list presented to : Adult Children ? ?Expected Discharge Plan and Services ?Expected Discharge Plan: Home/Self Care ?  ?Discharge Planning Services: CM Consult ?Post Acute Care Choice: Resumption of Svcs/PTA Provider ?Living arrangements for the past 2 months: [place] ?                ?  ?  ?  ?  ?  ?  ?  ?  ?  ?  ? ?Prior Living Arrangements/Services ?Living arrangements for the past 2 months: [place] ?Lives with:: Adult Children ?Patient language and need for interpreter reviewed:: Yes ?Do you feel safe going back to the place where you live?: Yes      ?Need for Family Participation in Patient Care: Yes (Comment) ?Care giver support system in place?: Yes (comment) ?  ?Criminal Activity/Legal Involvement Pertinent to Current Situation/Hospitalization: No - Comment as needed ? ?Activities of Daily Living ?  ?  ? ?Permission Sought/Granted ?Permission sought to share information with : Case Manager ?Permission granted to share information with : Yes, Verbal Permission Granted ? Share Information with NAME: Case Manager ?   ?   ?   ? ?Emotional Assessment ?Appearance:: Appears stated age ?Attitude/Demeanor/Rapport: Gracious ?Affect (typically observed): Accepting ?Orientation: : Oriented to Self ?Alcohol / Substance Use: Not Applicable ?Psych Involvement: No  (comment) ? ?Admission diagnosis:  Encephalopathy [G93.40] ?Acute urinary tract infection [N39.0] ?Sepsis secondary to UTI ([place]) [A41.9, N39.0] ?AMS (altered mental status) [R41.82] ?Patient Active Problem List  ? Diagnosis Date Noted  ? Sepsis secondary to UTI ([place]) 06/10/2021  ? History of CVA (cerebrovascular accident) 06/10/2021  ? Dementia with behavioral disturbance ([place]) 06/10/2021  ? HTN (hypertension) 06/10/2021  ? Diarrhea 06/10/2021  ? Physical deconditioning 06/10/2021  ? Failure to thrive in adult 06/10/2021  ? Acute metabolic encephalopathy 07/62/2633  ? ?PCP:  Clovia Cuff, MD ?Pharmacy:   ?[facility] [address] ?[address] ?[place] [address] ?Phone: [phone] Fax: [phone] ? ? ? ? ?Social Determinants of Health (SDOH) Interventions ?  ? ?Readmission Risk Interventions ?   ? View : No data to display.  ?  ?  ?  ? ? ? ?

## 2021-06-10 NOTE — Progress Notes (Signed)
Initial Nutrition Assessment ? ?DOCUMENTATION CODES:  ? ?Not applicable ? ?INTERVENTION:  ?- will order Ensure Plus High Protein BID, each supplement provides 350 kcal and 20 grams of protein. ?- will order 1 tablet Multivitamin with minerals daily. ?- weigh patient today.  ?- complete NFPE at follow-up. ? ? ?NUTRITION DIAGNOSIS:  ? ?Increased nutrient needs related to acute illness as evidenced by estimated needs. ? ?GOAL:  ? ?Patient will meet greater than or equal to 90% of their needs ? ?MONITOR:  ? ?PO intake, Supplement acceptance, Labs, Weight trends ? ?REASON FOR ASSESSMENT:  ? ?Consult ?Assessment of nutrition requirement/status ? ?ASSESSMENT:  ? ?88 y.o. female with medical history of Alzheimer's dementia, HTN, arthritis, and CHF. She presented to the ED with AMS and increased agitation, consistent with prior UTIs, per family report. Her daughter reported constipation x2 weeks and that for the 2 days PTA she was having diarrhea. Patient admitted for AMS. ? ?Patient moved from the ED to [place] a short time ago. MST has not yet been completed. She has not been seen by a [place] RD at any time in the past.  ? ?She has not been weighed since 08/09/16 and then it appears that that weight was copied forward to 07/03/17 and 11/04/19. No information documented in the edema section of flow sheet.  ? ?Flow sheet documentation indicates she is a/o to self only.  ? ? ?Labs reviewed; BUN: 30 mg/dl. ?Medications reviewed. ?  ? ?NUTRITION - FOCUSED PHYSICAL EXAM: ? ?Unable to complete at this time.  ? ?Diet Order:   ?Diet Order   ? ?       ?  Diet regular Room service appropriate? Yes; Fluid consistency: Thin  Diet effective now       ?  ? ?  ?  ? ?  ? ? ?EDUCATION NEEDS:  ? ?No education needs have been identified at this time ? ?Skin:  Skin Assessment: Reviewed RN Assessment ? ?Last BM:  PTA/unknown ? ?Height:  ? ?Ht Readings from Last 1 Encounters:  ?11/04/19 '5\' 3"'$  (1.6 m)  ? ? ?Weight:  ? ?Wt Readings from Last 1  Encounters:  ?11/04/19 77.6 kg  ? ? ? ?Estimated Nutritional Needs:  ?Kcal:  1400-1600 kcal ?Protein:  70-80 grams ?Fluid:  >/= 1.6 L/day ? ? ? ? ? ?Jarome Matin, MS, RD, LDN ?Registered Dietitian II ?Inpatient Clinical Nutrition ?RD pager # and on-call/weekend pager # available in [place]  ? ?

## 2021-06-10 NOTE — Assessment & Plan Note (Signed)
Infarct of the medial right cerebellum noted on CT head. MRI brain shows it is chronic.  ?Start aspirin ?Check Lipid and HbA1C  ?

## 2021-06-10 NOTE — Assessment & Plan Note (Addendum)
Continue home haloperidol and seroquel ?

## 2021-06-11 ENCOUNTER — Encounter (HOSPITAL_COMMUNITY): Payer: Self-pay | Admitting: Internal Medicine

## 2021-06-11 DIAGNOSIS — D696 Thrombocytopenia, unspecified: Secondary | ICD-10-CM

## 2021-06-11 DIAGNOSIS — G9341 Metabolic encephalopathy: Secondary | ICD-10-CM | POA: Diagnosis not present

## 2021-06-11 LAB — BASIC METABOLIC PANEL
Anion gap: 4 — ABNORMAL LOW (ref 5–15)
BUN: 31 mg/dL — ABNORMAL HIGH (ref 8–23)
CO2: 27 mmol/L (ref 22–32)
Calcium: 8.7 mg/dL — ABNORMAL LOW (ref 8.9–10.3)
Chloride: 108 mmol/L (ref 98–111)
Creatinine, Ser: 0.84 mg/dL (ref 0.44–1.00)
GFR, Estimated: >60 mL/min (ref >60)
Glucose, Bld: 69 mg/dL — ABNORMAL LOW (ref 70–99)
Potassium: 3.5 mmol/L (ref 3.5–5.1)
Sodium: 139 mmol/L (ref 135–145)

## 2021-06-11 LAB — GLUCOSE, CAPILLARY
Glucose-Capillary: 76 mg/dL (ref 70–99)
Glucose-Capillary: 85 mg/dL (ref 70–99)
Glucose-Capillary: 92 mg/dL (ref 70–99)
Glucose-Capillary: 108 mg/dL — ABNORMAL HIGH (ref 70–99)

## 2021-06-11 LAB — CBC
HCT: 23.6 % — ABNORMAL LOW (ref 36.0–46.0)
Hemoglobin: 7.9 g/dL — ABNORMAL LOW (ref 12.0–15.0)
MCH: 24.2 pg — ABNORMAL LOW (ref 26.0–34.0)
MCHC: 33.5 g/dL (ref 30.0–36.0)
MCV: 72.2 fL — ABNORMAL LOW (ref 80.0–100.0)
Platelets: 66 10*3/uL — ABNORMAL LOW (ref 150–400)
RBC: 3.27 MIL/uL — ABNORMAL LOW (ref 3.87–5.11)
RDW: 17 % — ABNORMAL HIGH (ref 11.5–15.5)
WBC: 10.7 10*3/uL — ABNORMAL HIGH (ref 4.0–10.5)
nRBC: 0 % (ref 0.0–0.2)

## 2021-06-11 MED ORDER — INSULIN ASPART 100 UNIT/ML IJ SOLN: 0.0000 [IU] | Freq: Three times a day (TID) | INTRAMUSCULAR | Status: DC

## 2021-06-11 MED ORDER — DEXTROSE-NACL 5-0.9 % IV SOLN: INTRAVENOUS | Status: AC

## 2021-06-11 NOTE — Progress Notes (Signed)
?PROGRESS NOTE ?Tara Dixon  ZCH:[id number] DOB: [birth year] DOA: 06/09/2021 ?PCP: Clovia Cuff, MD  ? ?Brief Narrative/Hospital Course: ? 88 y.o. fe W/ Alzheimer's dementia, hypertension and chronic diastolic heart failure who presents with altered mental status. In ED lethargic , daughter and son-in-law at bedside helped with history baseline is sometimes able to recognize self and family member but otherwise not oriented to time recurrent event but for the past several days daughter has noticed increasing agitation which usually occurs when she has UTI. She was more lethartic also dealing with constipation for the past 2 weeks.  Daughter has been trying different regimens including senna, MiraLAX and Fleet enema. For the past 2 days she has actually been having diarrhea. ?  ?In the ED, she was afebrile normotensive on room air.  Leukocytosis of 16.2, hemoglobin 9.4 around her baseline of 10-11, platelet of 90 with previous platelets a year ago around 127. CMP with CBG 152, AST of 53, ALT of 38. ?Negative flu/COVID PCR. UA showing large leuk, negative nitrite and many bacteria. ?CT head with 10 mm infarct to medial right cerebellar hemisphere likely is chronic.  MRI of the brain pending at the time of admission. ?She was started on IV Rocephin and hospitalist was consulted for further management of AMS.  ?  ?Subjective: ?Seen and examined this morning.  Patient's sleeping. ?Overnight afebrile blood sugar low 69 this morning  ?Transient hypotension at 75 this morning and 120s ?patient's daughter and son-in-law at the bedside and extensively discussed ? ?Assessment and Plan: ?Principal Problem: ?  Acute metabolic encephalopathy ?Active Problems: ?  Sepsis secondary to UTI [facility]) ?  History of CVA (cerebrovascular accident) ?  Dementia with behavioral disturbance ([place]) ?  HTN (hypertension) ?  Diarrhea ?  Physical deconditioning ?  Failure to thrive in adult ?  Thrombocytopenia ([place]) ?  ?Acute metabolic  encephalopathy ?Dementia with behavioral disturbance: ?Baseline dementia with worsening of mental status with lethargy in the setting of UTI.  Continue UTI treatment delirium precaution fall precaution, Haldol Seroquel and supportive care.  Follow-up urine culture. ? ?Sepsis secondary to UTI poa: On admission tachycardic heart rate more than 90 leukocytosis with UTI.  Continue ceftriaxone.  Urine culture is pending..   ? ?Hypoglycemia noticed blood sugar was 69 in the BMP earlier this morning informed nurse to start hypoglycemia protocol D5 and monitor CBG encourage p.o. ?Recent Labs  ?Lab 06/09/21 ?1630 06/10/21 ?0500 06/11/21 ?2878 06/11/21 ?1140  ?GLUCAP 141*  --  76 85  ?HGBA1C  --  5.6  --   --   ?  ? ?Diarrhea:Several days of diarrhea following 2 weeks of constipation. X-ray abd-no acute finding.  ? ?HTN: BP soft overnight start IV fluids, continue to hold to hold HCTZ. ? ?Thrombocytopenia: Previously at 120 in 2021, now downtrending likely from sepsis.  Holding anticoagulants.  Hold aspirin to ?Recent Labs  ?Lab 06/09/21 ?1550 06/10/21 ?0500 06/11/21 ?0357  ?PLT 90* 74* 66*  ?  ?History of CVA: CT-Infarct of the medial right cerebellum noted on CT head. MRI brain shows it is chronic.  ?Hold aspirin. Lipid panel with LDL 83 stable.  A1c normal. ? ?Deconditioning/debility/failure to thrive/goals of care/ambulatory dysfunction nonambulatory at baseline: ?She is full code consulted palliative care.  She was on hospice at home was discharged after 6 months of being in the hospice.  Discussed about CODE STATUS extensively, palliative care input pending. ? ?DVT prophylaxis: Place and maintain sequential compression device Start: 06/10/21 0053 ?Code Status:   Code  Status: Full Code ?Family Communication: plan of care discussed with patient at bedside. ?Patient status is: Inpatient level of care: Telemetry  ?Remains inpatient because: Ongoing management of encephalopathy and UTI ?Patient currently not stable ? ?Dispo:  The patient is from: Home ?           Anticipated disposition: home ? ?Mobility Assessment (last 72 hours)   ? ? Mobility Assessment   ? ? [place] Name 06/11/21 1100  ?  ?  ?  ?  ? Does patient have an order for bedrest or is patient medically unstable No - Continue assessment      ? What is the highest level of mobility based on the progressive mobility assessment? Level 1 (Bedfast) - Unable to balance while sitting on edge of bed      ? Is the above level different from baseline mobility prior to current illness? Yes - Recommend PT order      ? ?  ?  ? ?  ?  ? ?Objective: ?Vitals last 24 hrs: ?Vitals:  ? 06/10/21 2211 06/11/21 0201 06/11/21 0322 06/11/21 0556  ?BP: 119/71 (!) 75/54 90/60 126/76  ?Pulse: 99 88 72 70  ?Resp: '18 18  20  '$ ?Temp: 99.8 ?F (37.7 ?C) 98.2 ?F (36.8 ?C)  98.6 ?F (37 ?C)  ?TempSrc: Oral Oral  Axillary  ?SpO2: 96% 98%  99%  ? ?Weight change:  ? ?Physical Examination: ?General exam: Lethargic sleepy confused with baseline dementia  ?HEENT:Oral mucosa moist, Ear/Nose WNL grossly, dentition normal. ?Respiratory system: bilaterally diminished,no use of accessory muscle ?Cardiovascular system: S1 & S2 +, No JVD,. ?Gastrointestinal system: Abdomen soft,NT,ND, BS+ ?Nervous System: Lethargic, moving extremities and grossly nonfocal ?Extremities: edema neg,distal peripheral pulses palpable.  ?Skin: No rashes,no icterus. ?MSK: Normal muscle bulk,tone, power ? ? ?Medications reviewed:  ?Scheduled Meds: ? feeding supplement  237 mL Oral BID BM  ? haloperidol  0.5 mg Oral Daily  ? multivitamin with minerals  1 tablet Oral Daily  ? QUEtiapine  12.5 mg Oral QHS  ? ?Continuous Infusions: ? cefTRIAXone (ROCEPHIN)  IV 1 g (06/10/21 2052)  ? dextrose 5 % and 0.9% NaCl 75 mL/hr at 06/11/21 1660  ? ? ?  ?Diet Order   ? ?       ?  Diet regular Room service appropriate? Yes; Fluid consistency: Thin  Diet effective now       ?  ? ?  ?  ? ?  ?  ? ?Nutrition Problem: Increased nutrient needs ?Etiology: acute  illness ?Signs/Symptoms: estimated needs ?Interventions: Ensure Enlive (each supplement provides 350kcal and 20 grams of protein), MVI ? ? ?Intake/Output Summary (Last 24 hours) at 06/11/2021 1142 ?Last data filed at 06/11/2021 0500 ?Gross per 24 hour  ?Intake 100 ml  ?Output --  ?Net 100 ml  ? ?Net IO Since Admission: -100 mL [06/11/21 1142]  ?Wt Readings from Last 3 Encounters:  ?11/04/19 77.6 kg  ?07/03/17 77.6 kg  ?08/09/16 77.6 kg  ?  ? ?Unresulted Labs (From admission, onward)  ? ?  Start     Ordered  ? 06/09/21 1551  C Difficile Quick Screen w PCR reflex  (C Difficile quick screen w PCR reflex panel )  Once, for 24 hours,   URGENT       ?References:    CDiff Information Tool  ? 06/09/21 1550  ? 06/09/21 1551  Gastrointestinal Panel by PCR , Stool  (Gastrointestinal Panel by PCR, Stool                                                                                                                                                     **  Does Not include CLOSTRIDIUM DIFFICILE testing. **If CDIFF testing is needed, place order from the "C Difficile Testing" order set.**)  Once,   URGENT       ? 07/06/21 1550  ? ?  ?  ? ?  ?Data Reviewed: I have personally reviewed following labs and imaging studies ?CBC: ?Recent Labs  ?Lab 07/06/2021 ?1550 06/10/21 ?0500 06/11/21 ?0357  ?WBC 16.2* 17.1* 10.7*  ?NEUTROABS 14.7*  --   --   ?HGB 9.8* 8.7* 7.9*  ?HCT 29.6* 26.2* 23.6*  ?MCV 73.6* 73.2* 72.2*  ?PLT 90* 74* 66*  ? ?Basic Metabolic Panel: ?Recent Labs  ?Lab 2021-07-06 ?1550 06/10/21 ?0500 06/11/21 ?0357  ?NA 141 141 139  ?K 3.6 3.9 3.5  ?CL 107 109 108  ?CO2 '26 26 27  '$ ?GLUCOSE 152* 94 69*  ?BUN 30* 30* 31*  ?CREATININE 0.86 0.72 0.84  ?CALCIUM 9.4 9.0 8.7*  ? ?GFR: ?CrCl cannot be calculated (Unknown ideal weight.). ?Liver Function Tests: ?Recent Labs  ?Lab 07/06/21 ?1550 06/10/21 ?0500  ?AST 53* 39  ?ALT 38 32  ?ALKPHOS 89 78  ?BILITOT 0.6 0.6  ?PROT 6.4* 6.0*  ?ALBUMIN 3.1* 2.8*  ? ?Recent Labs  ?Lab 07-06-21 ?1550  ?LIPASE 23   ? ?No results for input(s): AMMONIA in the last 168 hours. ?Coagulation Profile: ?No results for input(s): INR, PROTIME in the last 168 hours. ?BNP (last 3 results) ?No results for input(s): PROBNP in the last 8760 hours. ?H

## 2021-06-12 DIAGNOSIS — Z7189 Other specified counseling: Secondary | ICD-10-CM

## 2021-06-12 DIAGNOSIS — F03918 Unspecified dementia, unspecified severity, with other behavioral disturbance: Secondary | ICD-10-CM | POA: Diagnosis not present

## 2021-06-12 DIAGNOSIS — B9689 Other specified bacterial agents as the cause of diseases classified elsewhere: Secondary | ICD-10-CM

## 2021-06-12 DIAGNOSIS — R5381 Other malaise: Secondary | ICD-10-CM

## 2021-06-12 DIAGNOSIS — G9341 Metabolic encephalopathy: Secondary | ICD-10-CM | POA: Diagnosis not present

## 2021-06-12 DIAGNOSIS — Z515 Encounter for palliative care: Secondary | ICD-10-CM

## 2021-06-12 DIAGNOSIS — R627 Adult failure to thrive: Secondary | ICD-10-CM

## 2021-06-12 DIAGNOSIS — Z66 Do not resuscitate: Secondary | ICD-10-CM

## 2021-06-12 DIAGNOSIS — N390 Urinary tract infection, site not specified: Secondary | ICD-10-CM | POA: Diagnosis not present

## 2021-06-12 LAB — CBC
HCT: 26.9 % — ABNORMAL LOW (ref 36.0–46.0)
Hemoglobin: 8.8 g/dL — ABNORMAL LOW (ref 12.0–15.0)
MCH: 24.5 pg — ABNORMAL LOW (ref 26.0–34.0)
MCHC: 32.7 g/dL (ref 30.0–36.0)
MCV: 74.9 fL — ABNORMAL LOW (ref 80.0–100.0)
Platelets: 71 10*3/uL — ABNORMAL LOW (ref 150–400)
RBC: 3.59 MIL/uL — ABNORMAL LOW (ref 3.87–5.11)
RDW: 17.9 % — ABNORMAL HIGH (ref 11.5–15.5)
WBC: 11.9 10*3/uL — ABNORMAL HIGH (ref 4.0–10.5)
nRBC: 0 % (ref 0.0–0.2)

## 2021-06-12 LAB — GLUCOSE, CAPILLARY
Glucose-Capillary: 119 mg/dL — ABNORMAL HIGH (ref 70–99)
Glucose-Capillary: 121 mg/dL — ABNORMAL HIGH (ref 70–99)
Glucose-Capillary: 138 mg/dL — ABNORMAL HIGH (ref 70–99)
Glucose-Capillary: 140 mg/dL — ABNORMAL HIGH (ref 70–99)
Glucose-Capillary: 85 mg/dL (ref 70–99)
Glucose-Capillary: 86 mg/dL (ref 70–99)

## 2021-06-12 LAB — URINE CULTURE: Culture: >=100000 — AB

## 2021-06-12 MED ORDER — CEFAZOLIN SODIUM-DEXTROSE 1-4 GM/50ML-% IV SOLN
1.0000 g | Freq: Three times a day (TID) | INTRAVENOUS | Status: DC
  Administered 2021-06-12 – 2021-06-13 (×2): 1 g via INTRAVENOUS
  Filled 2021-06-12 (×3): qty 50

## 2021-06-12 NOTE — Consult Note (Signed)
? ?Palliative Care Consult Note  ?                                ?Date: 06/12/2021  ? ?Patient Name: Tara Dixon  ?DOB: Nov 12, 1931  MRN: [id number]  Age / Sex: 88 y.o., female  ?PCP: Tara Cuff, MD ?Referring Physician: Antonieta Pert, MD ? ?Reason for Consultation: Establishing goals of care ? ?HPI/Patient Profile: Palliative Care consult requested for goals of care discussion in this 88 y.o. female  with past medical history of Alzheimer's dementia, hypertension, dCHF, and arthritis.  Admitted on 06/09/2021 via EMS from home with worsening mental status, lethargy in the setting of UTI.  Currently receiving IV antibiotics. ? ?Past Medical History:  ?Diagnosis Date  ? Allergy   ? Alzheimer disease ([employer])   ? Anemia   ? Arthritis   ? Hypertension   ? ? ? ?Subjective:  ? ?This NP Tara Dixon reviewed medical records, received report from team, assessed the patient and then met at the patient's bedside with Tara Dixon, patient's daughter/HCPOA to discuss diagnosis, prognosis, GOC, EOL wishes disposition and options. ? ?Ms. Tara Dixon is awake and alert. Daughter feeding her lunch. Minimal verbal response. Follows some simple commands. Unable to engage in discussions.  ?  ?Concept of Palliative Care was introduced as specialized medical care for people and their families living with serious illness.  It focuses on providing relief from the symptoms and stress of a serious illness.  The goal is to improve quality of life for both the patient and the family. Values and goals of care important to patient and family were attempted to be elicited. ? ?Tara Dixon verbalized her understanding and appreciation of palliative support.  Of note patient was previously under outpatient hospice support and unfortunately was discharged after 6 months.  Per daughter she was told patient no longer met criteria.  Recommended outpatient palliative follow-up however daughter reports minimum to no  interactions and does not know when patient was last seen. ? ?I created space and opportunity for family to explore state of health prior to admission, thoughts, and feelings. Tara Dixon shares patient is originally from [place].  Widowed.  She is patient's only child and she has 1 granddaughter.  Patient retired from [employer] and also worked for [employer] for several years. ? ?Prior to admission appetite was good.  Patient is total care in the home required feeding assistance, bathing, incontinence.  Does have a [employer] fast streamer.  Will be awake at times during the day and watch TV.  Does not engage much in conversations however at times was able to correctly identify her daughter and son-in-law by name.  Sleeps well through the night on current regimen. ? ?We discussed Her current illness and what it means in the larger context of Her on-going co-morbidities. Natural disease trajectory and expectations were discussed. ? ?Daughter verbalized understanding of current illness and co-morbidities.  She is realistic in her understanding of patient's overall condition and poor long-term prognosis.Tara Dixon shares she and her husband relocated and moved in with her mother approximately 2 years prior due to her ongoing health decline.  Prior to that they were able to have 24/7 hired caregivers which diminished with availability due to [place].  Daughter understands patient continues to progress.  She would like for her mother to be home if at all possible for what time she has left.  Tara Dixon is remaining hopeful for some  stability but again is not na?ve in her understanding of her mother's overall condition. ? ?I discussed the importance of continued conversation with family and their medical providers regarding overall plan of care and treatment options, ensuring decisions are within the context of the patients values and GOCs. ? ?Questions and concerns were addressed. The family was encouraged to call with questions  or concerns.  PMT will continue to support holistically as needed. ? ? ?Objective:  ? ?Primary Diagnoses: ?Present on Admission: ? Acute metabolic encephalopathy ? ? ?Scheduled Meds: ? feeding supplement  237 mL Oral BID BM  ? haloperidol  0.5 mg Oral Daily  ? multivitamin with minerals  1 tablet Oral Daily  ? QUEtiapine  12.5 mg Oral QHS  ? ? ?Continuous Infusions: ?  ceFAZolin (ANCEF) IV    ? ? ?PRN Meds: ?acetaminophen, senna-docusate ? ?Allergies  ?Allergen Reactions  ? Ampicillin Other (See Comments)  ?  unknown  ? Bactrim [Sulfamethoxazole-Trimethoprim] Other (See Comments)  ?  unknown  ? Celebrex [Celecoxib] Other (See Comments)  ?  unknown  ? Donepezil   ?  Other reaction(s): intolerant  ? Penicillins Other (See Comments)  ?  unknown  ? Polysporin [Bacitracin-Polymyxin B] Other (See Comments)  ?  unknown  ? ? ?Review of Systems  ?Neurological:  Positive for weakness.  ?     Hx of Alzheimer's. Minimal verbal response. Total care ?  ?Unless otherwise noted, a complete review of systems is negative. ? ?Physical Exam ?General: NAD, awaken, smiles, minimal verbal interaction ?Cardiovascular: regular rate and rhythm ?Pulmonary:  diminished bilaterally  ?Abdomen: soft, nontender, + bowel sounds ?Extremities: no edema, no joint deformities ?Skin: no rashes, warm and dry ?Neurological: baseline dementia, will follow simple commands  ? ?Vital Signs:  ?BP 123/76 (BP Location: Left Arm)   Pulse (!) 57   Temp 97.8 ?F (36.6 ?C) (Oral)   Resp 18   SpO2 100%  ?Pain Scale: 0-10 ?  ?Pain Score: 0-No pain ? ?SpO2: SpO2: 100 % ?O2 Device:SpO2: 100 % ?O2 Flow Rate: .  ? ?IO: Intake/output summary:  ?Intake/Output Summary (Last 24 hours) at 06/12/2021 1405 ?Last data filed at 06/12/2021 1300 ?Gross per 24 hour  ?Intake 1766.75 ml  ?Output --  ?Net 1766.75 ml  ? ? ?LBM: Last BM Date : 06/09/21 ?Baseline Weight:   ?Most recent weight:       ? ?Palliative Assessment/Data: PPS 30% ? ? ?Advanced Care Planning:  ? ?Primary Decision  Maker: ?HCPOA-Daughter Tara Dixon  ? ?Code Status/Advance Care Planning: ?DNR ? ?A discussion was had today regarding advanced directives. Concepts specific to code status, artifical feeding and hydration, continued IV antibiotics and rehospitalization was had.  The difference between a aggressive medical intervention path and a palliative comfort care path was discussed. The MOST form was introduced and discussed.  The daughter was provided with a copy which she plans to discuss with her husband and request follow-up tomorrow for completion.  Expressed she would not want any forms of artificial feedings or escalation in care.  Patient does not have an advanced directive outside of identifying daughter as healthcare power of attorney. ? ?We discussed patient's code status with consideration of her current illness and comorbidities.  Daughter verbalizes and confirms wishes for DNR/DNI.  She is aware and out of facility form will be provided at discharge. ? ?Patient was previously established with outpatient hospice and Palliative Care services.  She has not been active in some time.  Encouraged daughter to  reengage with outpatient palliative with plans to transition back to hospice in the near future.  She verbalized understanding and would like to have support from [facility].   ? ?Assessment & Plan:  ? ?SUMMARY OF RECOMMENDATIONS   ?DNR/DNI-as requested and confirmed by Tara Dixon (daughter/HCPOA)  ?Continue with current plan of care, no escalation, no artificial feeding tubes.  ?MOST form introduced with extensive education.  Form has been provided to daughter who plans to further discuss with her husband.  Is requesting follow-up tomorrow for completion. ?Patient previously under hospice care but was discharged due to not meeting criteria.  Recommending family reengage and at minimum outpatient palliative support with understanding patient would be appropriate to transition to hospice in the near  future.  Daughter verbalized agreement and is requesting support from [facility].  (TOC referral placed). ?Ongoing goals of care discussions. ?PMT will continue to support and follow. Please call te

## 2021-06-12 NOTE — Discharge Summary (Signed)
Physician Discharge Summary  ?Tara Dixon ERD:[id number] DOB: [birth year] DOA: 06/09/2021 ? ?PCP: Clovia Cuff, MD ? ?Admit date: 06/09/2021 ?Discharge date: 06/13/2021 ?Recommendations for Outpatient Follow-up:  ?Follow up with PCP in 1 weeks-call for appointment ?Please obtain BMP/CBC in one week ? ?Discharge Dispo: home w/ caregiver ?Discharge Condition: Stable ?Code Status:   Code Status: DNR ?Diet recommendation:  ?Diet Order   ? ?       ?  Diet regular Room service appropriate? Yes; Fluid consistency: Thin  Diet effective now       ?  ? ?  ?  ? ?  ?  ? ?Brief/Interim Summary: ? 88 y.o. fe W/ Alzheimer's dementia, hypertension and chronic diastolic heart failure who presents with altered mental status. In ED lethargic , daughter and son-in-law at bedside helped with history baseline is sometimes able to recognize self and family member but otherwise not oriented to time recurrent event but for the past several days daughter has noticed increasing agitation which usually occurs when she has UTI. She was more lethartic also dealing with constipation for the past 2 weeks.  Daughter has been trying different regimens including senna, MiraLAX and Fleet enema. For the past 2 days she has actually been having diarrhea. ?  ?In the ED, she was afebrile normotensive on room air.  Leukocytosis of 16.2, hemoglobin 9.4 around her baseline of 10-11, platelet of 90 with previous platelets a year ago around 127. CMP with CBG 152, AST of 53, ALT of 38. ?Negative flu/COVID PCR. UA showing large leuk, negative nitrite and many bacteria. ?CT head with 10 mm infarct to medial right cerebellar hemisphere likely is chronic.  MRI of the brain pending at the time of admission. ?She was started on IV Rocephin and hospitalist was consulted for further management of AMS.  ?Patient was treated with IV antibiotics urine culture came back with Klebsiella-changed to oral antibiotics.  Tolerating p.o., no recurrence of hypoglycemia at this time  remains medically stable for discharge to home with family, TOC consulted to arrange home health. ? ?Discharge Diagnoses: ?Acute metabolic encephalopathy ?Dementia with behavioral disturbance: ?Baseline dementia with worsening of mental status with lethargy in the setting of UTI.  Mentation much improved appears to be at baseline, continue supportive care, continue underlying UTI treatment.Continuedelirium precaution fall precaution, Haldol Seroquel and supportive care.   ?  ?Sepsis secondary to UTI poa ?Klebsiella UTI: ?S/P ceftriaxone, SWITCH OT PO ABX FOR DC HOME. Doing Cefadroxil  bid dosing, patient has tolerated cephalexin in the past including Ancef here. ?  ?Hypoglycemia noticed blood sugar was 69 in the BMP earlier 4/22 morning-not eating diet,weaneod off ivf. Encourage po, sugar stable now.  No hypoglycemia ?Recent Labs  ?Lab 06/10/21 ?0500 06/11/21 ?5631 06/12/21 ?1736 06/12/21 ?2014 06/12/21 ?2316 06/13/21 ?4970 06/13/21 ?2637  ?GLUCAP  --    < > 86 140* 121* 81 77  ?HGBA1C 5.6  --   --   --   --   --   --   ? < > = values in this interval not displayed.  ?  ?Diarrhea:Several days of diarrhea following 2 weeks of constipation. X-ray abd-no acute finding.  ?  ?HTN: BP Trending up resume HCTZ at home. ?  ?Thrombocytopenia: Previously at 120 in 2021, as well as 66 due to sepsis.  Now improving.  Follow-up with PCP.   ?Recent Labs  ?Lab 06/09/21 ?1550 06/10/21 ?0500 06/11/21 ?0357 06/12/21 ?0908 06/13/21 ?8588  ?PLT 90* 74* 66* 71* 76*  ?  ?  ?  History of CVA: CT-Infarct of the medial right cerebellum noted on CT head. MRI brain shows it is chronic.  ?Hold aspirin.Lipid panel with LDL 83 stable.  A1c normal. ?  ?Deconditioning/debility/failure to thrive/goals of care/ambulatory dysfunction nonambulatory at baseline: ?She is full code consulted palliative care.  She was on hospice at home was discharged after 6 months of being in the hospice.  Discussed about CODE STATUS extensively, palliative care input  pending. ?Patient daughter has made for DNR changes ordered.  Patient will benefit with outpatient palliative care follow-up upon discharge.  PT OT eval will likely benefit with home health  ? ?Consults: ?PMT ?Subjective: ?Alert awake pleasant self-feeding.  Asking me how am I doing today. ? ?Discharge Exam: ?Vitals:  ? 06/12/21 2251 06/13/21 0636  ?BP: 135/84 (!) 153/86  ?Pulse: 68 (!) 55  ?Resp: 20 18  ?Temp: 97.6 ?F (36.4 ?C) 97.7 ?F (36.5 ?C)  ?SpO2: 99% 92%  ? ?General: Pt is alert, awake, not in acute distress ?Cardiovascular: RRR, S1/S2 +, no rubs, no gallops ?Respiratory: CTA bilaterally, no wheezing, no rhonchi ?Abdominal: Soft, NT, ND, bowel sounds + ?Extremities: no edema, no cyanosis ? ?Discharge Instructions ? ?Discharge Instructions   ? ? Discharge instructions   Complete by: As directed ?  ? Please call call MD or return to ER for similar or worsening recurring problem that brought you to hospital or if any fever,nausea/vomiting,abdominal pain, uncontrolled pain, chest pain,  shortness of breath or any other alarming symptoms. ? ?Please follow-up your doctor as instructed in a week time and call the office for appointment. ? ?Please avoid alcohol, smoking, or any other illicit substance and maintain healthy habits including taking your regular medications as prescribed. ? ?You were cared for by a hospitalist during your hospital stay. If you have any questions about your discharge medications or the care you received while you were in the hospital after you are discharged, you can call the unit and ask to speak with the hospitalist on call if the hospitalist that took care of you is not available. ? ?Once you are discharged, your primary care physician will handle any further medical issues. Please note that NO REFILLS for any discharge medications will be authorized once you are discharged, as it is imperative that you return to your primary care physician (or establish a relationship with a primary  care physician if you do not have one) for your aftercare needs so that they can reassess your need for medications and monitor your lab values  ? Increase activity slowly   Complete by: As directed ?  ? ?  ? ?Allergies as of 06/13/2021   ? ?   Reactions  ? Ampicillin Other (See Comments)  ? unknown  ? Bactrim [sulfamethoxazole-trimethoprim] Other (See Comments)  ? unknown  ? Celebrex [celecoxib] Other (See Comments)  ? unknown  ? Donepezil   ? Other reaction(s): intolerant  ? Penicillins Other (See Comments)  ? unknown  ? Polysporin [bacitracin-polymyxin B] Other (See Comments)  ? unknown  ? ?  ? ?  ?Medication List  ?  ? ?STOP taking these medications   ? ?cephALEXin 500 MG capsule ?Commonly known as: KEFLEX ?  ?ciprofloxacin 250 MG tablet ?Commonly known as: CIPRO ?  ?folic acid 1 MG tablet ?Commonly known as: FOLVITE ?  ? ?  ? ?TAKE these medications   ? ?acetaminophen 160 MG/5ML liquid ?Commonly known as: TYLENOL ?Take 320 mg by mouth every 4 (four) hours as needed for fever. ?  ?  cefadroxil 500 MG capsule ?Commonly known as: DURICEF ?Take 1 capsule (500 mg total) by mouth 2 (two) times daily for 4 days. ?  ?diclofenac Sodium 1 % Gel ?Commonly known as: VOLTAREN ?Apply 2 g topically daily as needed (pain). ?  ?feeding supplement Liqd ?Take 237 mLs by mouth 2 (two) times daily between meals for 7 days. ?  ?haloperidol 0.5 MG tablet ?Commonly known as: HALDOL ?Take 0.5 mg by mouth daily. ?  ?hydrochlorothiazide 12.5 MG capsule ?Commonly known as: MICROZIDE ?Take 12.5 mg by mouth daily. ?  ?QUEtiapine 25 MG tablet ?Commonly known as: SEROQUEL ?Take 12.5 mg by mouth at bedtime. ?  ?senna 8.6 MG tablet ?Commonly known as: SENOKOT ?Take 1 tablet by mouth daily as needed for constipation. ?  ?sennosides-docusate sodium 8.6-50 MG tablet ?Commonly known as: SENOKOT-S ?Take 1 tablet by mouth daily as needed for constipation. ?  ? ?  ? ? Follow-up Information   ? ? Clovia Cuff, MD Follow up in 1 week(s).   ?Specialty:  Internal Medicine ?Contact information: ?[address] ?[place] [address] ?[phone] ? ? ?  ?  ? ?  ?  ? ?  ? ?Allergies  ?Allergen Reactions  ? Ampicillin Other (See Comments)  ?  unknown  ? Bac

## 2021-06-12 NOTE — Progress Notes (Signed)
?PROGRESS NOTE ?Tara Dixon  ZOX:[id number] DOB: [birth year] DOA: 06/09/2021 ?PCP: Clovia Cuff, MD  ? ?Brief Narrative/Hospital Course: ? 88 y.o. fe W/ Alzheimer's dementia, hypertension and chronic diastolic heart failure who presents with altered mental status. In ED lethargic , daughter and son-in-law at bedside helped with history baseline is sometimes able to recognize self and family member but otherwise not oriented to time recurrent event but for the past several days daughter has noticed increasing agitation which usually occurs when she has UTI. She was more lethartic also dealing with constipation for the past 2 weeks.  Daughter has been trying different regimens including senna, MiraLAX and Fleet enema. For the past 2 days she has actually been having diarrhea. ?  ?In the ED, she was afebrile normotensive on room air.  Leukocytosis of 16.2, hemoglobin 9.4 around her baseline of 10-11, platelet of 90 with previous platelets a year ago around 127. CMP with CBG 152, AST of 53, ALT of 38. ?Negative flu/COVID PCR. UA showing large leuk, negative nitrite and many bacteria. ?CT head with 10 mm infarct to medial right cerebellar hemisphere likely is chronic.  MRI of the brain pending at the time of admission. ?She was started on IV Rocephin and hospitalist was consulted for further management of AMS.  ?  ?Subjective: ?Seen and examined this morning daughter and son-in-law at the bedside.  Patient appears more alert awake smiling, asked me "how are you doing " after I woke her up.  Discussed with the daughter at the bedside about CODE STATUS-and she has made decision to make DNR ? ?Assessment and Plan: ?Principal Problem: ?  Acute metabolic encephalopathy ?Active Problems: ?  Sepsis secondary to UTI [facility]) ?  Dementia with behavioral disturbance ([place]) ?  HTN (hypertension) ?  Diarrhea ?  Physical deconditioning ?  Failure to thrive in adult ?  Thrombocytopenia ([place]) ?  ?Acute metabolic encephalopathy ?Dementia  with behavioral disturbance: ?Baseline dementia with worsening of mental status with lethargy in the setting of UTI.  Mentation much improved appears to be at baseline, continue supportive care, continue underlying UTI treatment.  Continuedelirium precaution fall precaution, Haldol Seroquel and supportive care.   ? ?Sepsis secondary to UTI poa: UTI with gram-negative rods, final culture pending continue current IV antibiotics.   ? ?Hypoglycemia noticed blood sugar was 69 in the BMP earlier 4/22 morning-not eating diet, will discontinue IV fluids in next hour or 2 encourage oral intake continue to monitor blood sugar TO ENSURE no more hypoglycemia ?Recent Labs  ?Lab 06/10/21 ?0500 06/11/21 ?8119 06/11/21 ?1140 06/11/21 ?1637 06/11/21 ?2133 06/12/21 ?0045 06/12/21 ?0805  ?GLUCAP  --    < > 85 92 108* 119* 85  ?HGBA1C 5.6  --   --   --   --   --   --   ? < > = values in this interval not displayed.  ? ?  ?Diarrhea:Several days of diarrhea following 2 weeks of constipation. X-ray abd-no acute finding.  ? ?HTN: BP overall stable continue to hold to hold HCTZ. ? ?Thrombocytopenia: Previously at 120 in 2021, now downtrending likely from sepsis.  Aspirin was restarted and discontinued.  Platelet count now uptrending.  Monitor ?Recent Labs  ?Lab 06/09/21 ?1550 06/10/21 ?0500 06/11/21 ?0357 06/12/21 ?0908  ?PLT 90* 74* 66* 71*  ? ?  ?History of CVA: CT-Infarct of the medial right cerebellum noted on CT head. MRI brain shows it is chronic.  ?Hold aspirin.Lipid panel with LDL 83 stable.  A1c normal. ? ?Deconditioning/debility/failure  to thrive/goals of care/ambulatory dysfunction nonambulatory at baseline: ?She is full code consulted palliative care.  She was on hospice at home was discharged after 6 months of being in the hospice.  Discussed about CODE STATUS extensively, palliative care input pending. ?Patient daughter has made for DNR changes ordered.  Patient will benefit with outpatient palliative care follow-up upon  discharge.  PT OT eval will likely benefit with home health .  In the ? ?DVT prophylaxis: Place and maintain sequential compression device Start: 06/10/21 0053 ?Code Status:   Code Status: DNR ?Family Communication: plan of care discussed with patient at bedside. ?Patient status is: Inpatient level of care: Telemetry  ?Remains inpatient because: Ongoing management of encephalopathy and UTI ?Patient currently not stable ? ?Dispo: The patient is from: Home ?           Anticipated disposition: home ? ?Mobility Assessment (last 72 hours)   ? ? Mobility Assessment   ? ? [place] Name 06/11/21 2021 06/11/21 1100  ?  ?  ?  ? Does patient have an order for bedrest or is patient medically unstable No - Continue assessment No - Continue assessment     ? What is the highest level of mobility based on the progressive mobility assessment? Level 1 (Bedfast) - Unable to balance while sitting on edge of bed Level 1 (Bedfast) - Unable to balance while sitting on edge of bed     ? Is the above level different from baseline mobility prior to current illness? Yes - Recommend PT order Yes - Recommend PT order     ? ?  ?  ? ?  ?  ? ?Objective: ?Vitals last 24 hrs: ?Vitals:  ? 06/11/21 0556 06/11/21 1302 06/11/21 2046 06/12/21 0601  ?BP: 126/76 96/63 133/81 123/76  ?Pulse: 70 74 75 (!) 57  ?Resp: '20 16 18 18  '$ ?Temp: 98.6 ?F (37 ?C) 97.6 ?F (36.4 ?C) 99.3 ?F (37.4 ?C) 97.8 ?F (36.6 ?C)  ?TempSrc: Axillary Oral Oral Oral  ?SpO2: 99% 97% 100% 100%  ? ?Weight change:  ? ?Physical Examination: ?General exam: Sleepy but woke up, smiling pleasant, at baseline.   ?HEENT:Oral mucosa moist, Ear/Nose WNL grossly, dentition normal. ?Respiratory system: bilaterally diminished,no use of accessory muscle ?Cardiovascular system: S1 & S2 +, No JVD,. ?Gastrointestinal system: Abdomen soft,NT,ND, BS+ ?Nervous System:Alert, awake, moving extremities and grossly nonfocal ?Extremities: edema neg,distal peripheral pulses palpable.  ?Skin: No rashes,no icterus. ?MSK:  Normal muscle bulk,tone, power ? ? ? ?Medications reviewed:  ?Scheduled Meds: ? feeding supplement  237 mL Oral BID BM  ? haloperidol  0.5 mg Oral Daily  ? multivitamin with minerals  1 tablet Oral Daily  ? QUEtiapine  12.5 mg Oral QHS  ? ?Continuous Infusions: ? cefTRIAXone (ROCEPHIN)  IV 1 g (06/11/21 2020)  ? ? ?  ?Diet Order   ? ?       ?  Diet regular Room service appropriate? Yes; Fluid consistency: Thin  Diet effective now       ?  ? ?  ?  ? ?  ?  ? ?Nutrition Problem: Increased nutrient needs ?Etiology: acute illness ?Signs/Symptoms: estimated needs ?Interventions: Ensure Enlive (each supplement provides 350kcal and 20 grams of protein), MVI ? ? ?Intake/Output Summary (Last 24 hours) at 06/12/2021 1042 ?Last data filed at 06/12/2021 0500 ?Gross per 24 hour  ?Intake 1906.75 ml  ?Output --  ?Net 1906.75 ml  ? ? ?Net IO Since Admission: 1,706.75 mL [06/12/21 1042]  ?Wt Readings from  Last 3 Encounters:  ?11/04/19 77.6 kg  ?07/03/17 77.6 kg  ?08/09/16 77.6 kg  ?  ? ?Unresulted Labs (From admission, onward)  ? ?  Start     Ordered  ? 06-22-2021 1551  C Difficile Quick Screen w PCR reflex  (C Difficile quick screen w PCR reflex panel )  Once, for 24 hours,   URGENT       ?References:    CDiff Information Tool  ? 06-22-21 1550  ? 22-Jun-2021 1551  Gastrointestinal Panel by PCR , Stool  (Gastrointestinal Panel by PCR, Stool                                                                                                                                                     **Does Not include CLOSTRIDIUM DIFFICILE testing. **If CDIFF testing is needed, place order from the "C Difficile Testing" order set.**)  Once,   URGENT       ? 06/22/21 1550  ? ?  ?  ? ?  ?Data Reviewed: I have personally reviewed following labs and imaging studies ?CBC: ?Recent Labs  ?Lab 2021/06/22 ?1550 06/10/21 ?0500 06/11/21 ?0357 06/12/21 ?0908  ?WBC 16.2* 17.1* 10.7* 11.9*  ?NEUTROABS 14.7*  --   --   --   ?HGB 9.8* 8.7* 7.9* 8.8*  ?HCT 29.6* 26.2*  23.6* 26.9*  ?MCV 73.6* 73.2* 72.2* 74.9*  ?PLT 90* 74* 66* 71*  ? ? ?Basic Metabolic Panel: ?Recent Labs  ?Lab 06/22/21 ?1550 06/10/21 ?0500 06/11/21 ?0357  ?NA 141 141 139  ?K 3.6 3.9 3.5  ?CL 107 109 108  ?

## 2021-06-13 DIAGNOSIS — G9341 Metabolic encephalopathy: Secondary | ICD-10-CM | POA: Diagnosis not present

## 2021-06-13 LAB — CBC
HCT: 26.5 % — ABNORMAL LOW (ref 36.0–46.0)
Hemoglobin: 8.5 g/dL — ABNORMAL LOW (ref 12.0–15.0)
MCH: 23.7 pg — ABNORMAL LOW (ref 26.0–34.0)
MCHC: 32.1 g/dL (ref 30.0–36.0)
MCV: 73.8 fL — ABNORMAL LOW (ref 80.0–100.0)
Platelets: 76 10*3/uL — ABNORMAL LOW (ref 150–400)
RBC: 3.59 MIL/uL — ABNORMAL LOW (ref 3.87–5.11)
RDW: 17.5 % — ABNORMAL HIGH (ref 11.5–15.5)
WBC: 7.3 10*3/uL (ref 4.0–10.5)
nRBC: 0.3 % — ABNORMAL HIGH (ref 0.0–0.2)

## 2021-06-13 LAB — GLUCOSE, CAPILLARY
Glucose-Capillary: 81 mg/dL (ref 70–99)
Glucose-Capillary: 77 mg/dL (ref 70–99)
Glucose-Capillary: 162 mg/dL — ABNORMAL HIGH (ref 70–99)

## 2021-06-13 MED ORDER — ENSURE ENLIVE PO LIQD: 237.0000 mL | Freq: Two times a day (BID) | ORAL | 0 refills | Status: AC

## 2021-06-13 MED ORDER — CEFADROXIL 500 MG PO CAPS
500.0000 mg | ORAL_CAPSULE | Freq: Two times a day (BID) | ORAL | 0 refills | Status: AC
Start: 2021-06-13 — End: 2021-06-17

## 2021-06-13 NOTE — Evaluation (Signed)
Occupational Therapy Evaluation ?Patient Details ?Name: Tara Dixon ?MRN: [id number] ?DOB: November 04, 1931 ?Today's Date: 06/13/2021 ? ? ?History of Present Illness Pt. is a 88 y.o. F presenting to [facility] on 4/20 with AMS w/ associated UTI. PMH significant for Alzheimer's dementia, HTN, and CHF.  ? ?Clinical Impression ?  ?Patient is a 88 year old female who was admitted for above. Patient was living at home with support from family prior to admission. Patient was noted to have had decreased ability to engage in Adls,increasing caregiver burden, and increased risks of decreased quality of life. Patient would benefit from continued Skilled OT services at acute level and at time of d/c to continue enable patient to be able to engage in ADL tasks and prevent learned helplessness.   ? ?Recommendations for follow up therapy are one component of a multi-disciplinary discharge planning process, led by the attending physician.  Recommendations may be updated based on patient status, additional functional criteria and insurance authorization.  ? ?Follow Up Recommendations ? Home health OT  ?  ?Assistance Recommended at Discharge Frequent or constant Supervision/Assistance  ?Patient can return home with the following Two people to help with walking and/or transfers;Two people to help with bathing/dressing/bathroom;Direct supervision/assist for medications management;Assist for transportation;Direct supervision/assist for financial management;Assistance with cooking/housework;Assistance with feeding ? ?  ?Functional Status Assessment ? Patient has had a recent decline in their functional status and demonstrates the ability to make significant improvements in function in a reasonable and predictable amount of time.  ?Equipment Recommendations ? None recommended by OT  ?  ?Recommendations for Other Services   ? ? ?  ?Precautions / Restrictions Precautions ?Precautions: Fall ?Precaution Comments: L lean, bilateral foot  drop. ?Restrictions ?Weight Bearing Restrictions: No  ? ?  ? ?Mobility Bed Mobility ?Overal bed mobility: Needs Assistance ?Bed Mobility: Supine to Sit, Sit to Supine ?  ?  ?Supine to sit: Max assist, +2 for physical assistance ?Sit to supine: Max assist, +2 for physical assistance ?  ?General bed mobility comments: max +2 for trunk and LE management, use of bed pad to assist in transition to and from EOB. Cues for adjusting posture to upright, can correct L lateral leaning with max multimodal cuing ?  ? ?Transfers ?  ?  ?  ?  ?  ?  ?  ?  ?  ?  ?  ? ?  ?Balance Overall balance assessment: Needs assistance ?Sitting-balance support: No upper extremity supported, Feet supported ?Sitting balance-Leahy Scale: Fair ?Sitting balance - Comments: fair to poor- cannot sustain upright sitting without trunk support but can sit unsupported if allowed to L lateral lean. EOB sitting x10 minutes, UE reaching and ADL tasks, LE kicking ?Postural control: Left lateral lean ?  ?  ?  ?  ?  ?  ?  ?  ?  ?  ?  ?  ?  ?  ?   ? ?ADL either performed or assessed with clinical judgement  ? ?ADL Overall ADL's : Needs assistance/impaired ?Eating/Feeding: Set up;Bed level ?  ?Grooming: Wash/dry face;Minimal assistance;Sitting ?Grooming Details (indicate cue type and reason): EOB with lateral lean to L side. patient noted to feel off balance when corrected to midline in sitting ?Upper Body Bathing: Total assistance;Bed level ?  ?Lower Body Bathing: Total assistance;Bed level ?  ?Upper Body Dressing : Total assistance;Bed level ?  ?Lower Body Dressing: Bed level;Total assistance ?  ?Toilet Transfer: +2 for physical assistance;+2 for safety/equipment;Total assistance ?  ?Toileting- Clothing Manipulation and Hygiene: Total  assistance;Bed level ?  ?  ?  ?  ?   ? ? ? ?Vision Patient Visual Report: No change from baseline ?Additional Comments: diffiuclt to assess with cog level. able to find food items and bring to mouth this AM  ?   ?Perception   ?   ?Praxis   ?  ? ?Pertinent Vitals/Pain Pain Assessment ?Pain Assessment: Faces ?Faces Pain Scale: No hurt  ? ? ? ?Hand Dominance Right ?  ?Extremity/Trunk Assessment Upper Extremity Assessment ?Upper Extremity Assessment: Defer to OT evaluation ?RUE Deficits / Details: limited shoulder ROM with audible crepitus with movement. patient able to [facility] to about 60 degrees. elbow WFL. ?LUE Deficits / Details: audible crepitus with this UE able to AROM to about 80 degrees FF and ABD, able to grasp fingers but no power behind squeeze. ?  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  ?Cervical / Trunk Assessment ?Cervical / Trunk Assessment: Kyphotic;Other exceptions ?Cervical / Trunk Exceptions: preference for L lateral lean ?  ?Communication   ?  ?Cognition Arousal/Alertness: Awake/alert ?Behavior During Therapy: [facility] for tasks assessed/performed ?Overall Cognitive Status: History of cognitive impairments - at baseline ?  ?  ?  ?  ?  ?  ?  ?  ?  ?  ?  ?  ?  ?  ?  ?  ?General Comments: patient was oriented to self and able to report birthday with some cues to assist getting right month. patient's daughter was in room and able to provide PLOF ?  ?  ?General Comments    ? ?  ?Exercises   ?  ?Shoulder Instructions    ? ? ?Home Living Family/patient expects to be discharged to:: Private residence ?Living Arrangements: Children ?Available Help at Discharge: Family;Available 24 hours/day ?Type of Home: House ?Home Access: Stairs to enter ?Entrance Stairs-Number of Steps: 2; uses the ramp to get out of the house ?  ?Home Layout: Two level ?Alternate Level Stairs-Number of Steps: stair lift, hopeful that ambulance can deliver her upstairs at d/c ?  ?  ?  ?  ?  ?  ?Home Equipment: Wheelchair - manual;Other (comment);Rolling Walker (2 wheels);BSC/3in1;Hospital bed ?  ?Additional Comments: hoyer lift ?  ? ?  ?Prior Functioning/Environment  Prior Level of Function : Needs assist ?  ?  ?  ?  ?  ?  ?Mobility Comments: mostly bed-level, unless going to appointments ?ADLs Comments: total assist for bathing/dressing from daughter, pt usually feeds self. ?  ? ?  ?  ?OT Problem List: Decreased activity tolerance;Impaired balance (sitting and/or standing);Decreased safety awareness ?  ?   ?OT Treatment/Interventions: Self-care/ADL training;Energy conservation;DME and/or AE instruction;Therapeutic activities;Balance training;Patient/family education  ?  ?OT Goals(Current goals can be found in the care plan section) Acute Rehab OT Goals ?Patient Stated Goal: none stated ?OT Goal Formulation: With patient/family ?Time For Goal Achievement: 06/27/21 ?Potential to Achieve Goals: Fair  ?OT Frequency: Min 2X/week ?  ? ?Co-evaluation PT/OT/SLP Co-Evaluation/Treatment: Yes ?Reason for Co-Treatment: For patient/therapist safety;To address functional/ADL transfers;Complexity of the patient's impairments (multi-system involvement) ?  ?OT goals addressed during session: ADL's and self-care ?  ? ?  ?AM-PAC OT "6 Clicks" Daily Activity     ?Outcome Measure Help from another person eating meals?: A Little ?Help from another person taking care of personal grooming?: A Little ?Help from another person toileting, which includes using toliet, bedpan, or urinal?: Total ?Help from another person bathing (including  washing, rinsing, drying)?: Total ?Help from another person to put on and taking off regular upper body clothing?: Total ?Help from another person to put on and taking off regular lower body clothing?: Total ?6 Click Score: 10 ?  ?End of Session   ? ?Activity Tolerance: Patient tolerated treatment well ?Patient left: in bed;with family/visitor present;with call bell/phone within reach;with bed alarm set ? ?OT Visit Diagnosis: Unsteadiness on feet (R26.81);Muscle weakness (generalized) (M62.81)  ?              ?Time: 1165-7903 ?OT Time Calculation (min): 23 min ?Charges:   OT General Charges ?$OT Visit: 1 Visit ?OT Evaluation ?$OT Eval Low Complexity: 1 Low ? ?Jolane Bankhead OTR/L, MS ?Acute Rehabilitation Department ?Office# [phone] ?Pager# [phone] ? ? ?Tara Dixon ?06/13/2021, 12:18 PM ?

## 2021-06-13 NOTE — Evaluation (Signed)
Physical Therapy Evaluation ?Patient Details ?Name: Tara Dixon ?MRN: [id number] ?DOB: [birth year] ?Today's Date: 06/13/2021 ? ?History of Present Illness ? Pt. is a 88 y.o. F presenting to [facility] on 4/20 with AMS w/ associated UTI. PMH significant for Alzheimer's dementia, HTN, and CHF.  ?Clinical Impression ?  ?Pt presents with generalized weakness, impaired activity tolerance, fair sitting balance with L lateral lean, impaired cognition which is close to baseline. Pt to benefit from acute PT to address deficits. Pt tolerated translation to and from EOB, requiring max +2 to perform at this time but able to sit unsupported x10 minutes once at EOB. Pt's daughter at bedside and is requesting home health services to improve pt strength, tolerance for sitting upright (EOB or w/c), and transfer-level mobility. PT to progress mobility as tolerated, and will continue to follow acutely.  ?   ?   ? ?Recommendations for follow up therapy are one component of a multi-disciplinary discharge planning process, led by the attending physician.  Recommendations may be updated based on patient status, additional functional criteria and insurance authorization. ? ?Follow Up Recommendations Home health PT ? ?  ?Assistance Recommended at Discharge Frequent or constant Supervision/Assistance  ?Patient can return home with the following ? Two people to help with walking and/or transfers;A little help with bathing/dressing/bathroom;Assistance with feeding;Assistance with cooking/housework;Direct supervision/assist for medications management;Direct supervision/assist for financial management;Assist for transportation;Help with stairs or ramp for entrance ? ?  ?Equipment Recommendations None recommended by PT  ?Recommendations for Other Services ?    ?  ?Functional Status Assessment Patient has had a recent decline in their functional status and/or demonstrates limited ability to make significant improvements in function in a reasonable and  predictable amount of time  ? ?  ?Precautions / Restrictions Precautions ?Precautions: Fall ?Precaution Comments: L lean, bilateral foot drop. ?Restrictions ?Weight Bearing Restrictions: No  ? ?  ? ?Mobility ? Bed Mobility ?Overal bed mobility: Needs Assistance ?Bed Mobility: Supine to Sit, Sit to Supine ?  ?  ?Supine to sit: Max assist, +2 for physical assistance ?Sit to supine: Max assist, +2 for physical assistance ?  ?General bed mobility comments: max +2 for trunk and LE management, use of bed pad to assist in transition to and from EOB. Cues for adjusting posture to upright, can correct L lateral leaning with max multimodal cuing ?  ? ?Transfers ?  ?  ?  ?  ?  ?  ?  ?  ?  ?General transfer comment: nt - transfer level with hoyer lift at home ?  ? ?Ambulation/Gait ?  ?  ?  ?  ?  ?  ?  ?  ? ?Stairs ?  ?  ?  ?  ?  ? ?Wheelchair Mobility ?  ? ?Modified Rankin (Stroke Patients Only) ?  ? ?  ? ?Balance Overall balance assessment: Needs assistance ?Sitting-balance support: No upper extremity supported, Feet supported ?Sitting balance-Leahy Scale: Fair ?Sitting balance - Comments: fair to poor- cannot sustain upright sitting without trunk support but can sit unsupported if allowed to L lateral lean. EOB sitting x10 minutes, UE reaching and ADL tasks, LE kicking ?Postural control: Left lateral lean ?  ?  ?  ?  ?  ?  ?  ?  ?  ?  ?  ?  ?  ?  ?   ? ? ? ?Pertinent Vitals/Pain Pain Assessment ?Pain Assessment: Faces ?Faces Pain Scale: No hurt ?Pain Intervention(s): Monitored during session  ? ? ?Home Living Family/patient expects  to be discharged to:: Private residence ?Living Arrangements: Children ?Available Help at Discharge: Family;Available 24 hours/day ?Type of Home: House ?Home Access: Stairs to enter ?  ?Entrance Stairs-Number of Steps: 2; uses the ramp to get out of the house ?Alternate Level Stairs-Number of Steps: stair lift, hopeful that ambulance can deliver her upstairs at d/c ?Home Layout: Two level ?Home  Equipment: Wheelchair - manual;Other (comment);Rolling Walker (2 wheels);BSC/3in1;Hospital bed ?Additional Comments: hoyer lift  ?  ?Prior Function Prior Level of Function : Needs assist ?  ?  ?  ?  ?  ?  ?Mobility Comments: mostly bed-level, unless going to appointments ?ADLs Comments: total assist for bathing/dressing from daughter, pt usually feeds self. ?  ? ? ?Hand Dominance  ? Dominant Hand: Right ? ?  ?Extremity/Trunk Assessment  ? Upper Extremity Assessment ?Upper Extremity Assessment: Defer to OT evaluation ?RUE Deficits / Details: limited shoulder ROM with audible crepitus with movement. patient able to [facility] to about 60 degrees. elbow WFL. ?LUE Deficits / Details: audible crepitus with this UE able to AROM to about 80 degrees FF and ABD, able to grasp fingers but no power behind squeeze. ?  ? 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  ? ?Cervical / Trunk Assessment ?Cervical / Trunk Assessment: Kyphotic;Other exceptions ?Cervical / Trunk Exceptions: preference for L lateral lean  ?Communication  ?    ?Cognition Arousal/Alertness: Awake/alert ?Behavior During Therapy: [facility] for tasks assessed/performed ?Overall Cognitive Status: History of cognitive impairments - at baseline ?  ?  ?  ?  ?  ?  ?  ?  ?  ?  ?  ?  ?  ?  ?  ?  ?General Comments: History of Alzheimer's. patient was oriented to self and able to report birthday with some cues to assist getting right month. patient's daughter was in room and able to provide PLOF ?  ?  ? ?  ?General Comments   ? ?  ?Exercises General Exercises - Lower Extremity ?Long Arc Quad: AAROM, Both, 5 reps, Seated  ? ?Assessment/Plan  ?  ?PT Assessment Patient needs continued PT services  ?PT Problem List Decreased strength;Decreased mobility;Decreased safety awareness;Decreased activity tolerance;Decreased balance;Decreased cognition;Decreased knowledge of use of  DME;Pain;Decreased knowledge of precautions ? ?   ?  ?PT Treatment Interventions DME instruction;Therapeutic activities;Gait training;Therapeutic exercise;Patient/family education;Balance training;Stair training;Neuromuscular re-education;Functional mobility training   ? ?PT Goals (Current goals can be found in the Care Plan section)  ?Acute Rehab PT Goals ?Patient Stated Goal: home, increase pt mobility and prevent secondary complications ?PT Goal Formulation: With patient/family ?Time For Goal Achievement: 06/27/21 ?Potential to Achieve Goals: Good ? ?  ?Frequency Min 3X/week ?  ? ? ?Co-evaluation PT/OT/SLP Co-Evaluation/Treatment: Yes ?Reason for Co-Treatment: For patient/therapist safety;To address functional/ADL transfers;Complexity of the patient's impairments (multi-system involvement) ?  ?  ?  ? ? ?  ?AM-PAC PT "6 Clicks" Mobility  ?Outcome Measure Help needed turning from your back to your side while in a flat bed without using bedrails?: A Lot ?Help needed moving from lying on your back to sitting on the side of a flat bed without using bedrails?: A Lot ?Help needed moving to and from a bed to a chair (including a wheelchair)?: Total ?Help needed standing up from a chair using your arms (e.g., wheelchair or bedside chair)?: Total ?Help needed to walk in hospital room?: Total ?Help needed climbing 3-5 steps with a railing? : Total ?6 Click  Score: 8 ? ?  ?End of Session   ?Activity Tolerance: Patient tolerated treatment well;Patient limited by fatigue ?Patient left: in bed;with call bell/phone within reach;with bed alarm set;with family/visitor present ?Nurse Communication: Mobility status ?PT Visit Diagnosis: Other abnormalities of gait and mobility (R26.89);Muscle weakness (generalized) (M62.81) ?  ? ?Time: 9826-4158 ?PT Time Calculation (min) (ACUTE ONLY): 23 min ? ? ?Charges:   PT Evaluation ?$PT Eval Low Complexity: 1 Low ?  ?  ?   ? ?Stacie Glaze, PT DPT ?Acute Rehabilitation Services ?Pager [phone]   ?Office [phone] ? ? ?Corneluis Allston E Stroup ?06/13/2021, 12:12 PM ? ?

## 2021-06-13 NOTE — Care Management Important Message (Signed)
Important Message ? ?Patient Details IM Letter placed in Patients room. ?Name: Tara Dixon ?MRN: [id number] ?Date of Birth: 04-12-1931 ? ? ?Medicare Important Message Given:  Yes ? ? ? ? ?Kerin Salen ?06/13/2021, 12:25 PM ?

## 2021-06-13 NOTE — Progress Notes (Signed)
Pt teaching provided to pt daughter.  Daughter asked about giving aspirin daily to pt.  MD notified and he advised to follow up with PCP before starting.  This was communicated to daughter.   ?Pt belongings returned to daughter.  [facility] provided transport.   ? ? ?

## 2021-06-13 NOTE — TOC Transition Note (Addendum)
Transition of Care (TOC) - CM/SW Discharge Note ? ? ?Patient Details  ?Name: [name] ?MRN: [id number] ?Date of Birth: 1931-06-27 ? ?Transition of Care [facility]) CM/SW Contact:  ?[name], RN ?Phone Number: ?06/13/2021, 10:44 AM ? ? ?Clinical Narrative:    ?Patient discharged to return home with family.  No toc needs present. ?Hhx ordered spoke with the daughter has no preference as to agency.  ?[name] at [employer] ?[facility] called for transport at 1256 will be around 2-3pm for pick up ?Final next level of care: Home/Self Care ?Barriers to Discharge: Continued Medical Work up ? ? ?Patient Goals and CMS Choice ?Patient states their goals for this hospitalization and ongoing recovery are:: Home ?CMS Medicare.gov Compare Post Acute Care list provided to:: Patient Represenative (must comment) [name] dtr) ?Choice offered to / list presented to : Adult Children ? ?Discharge Placement ?  ?           ?  ?  ?  ?  ? ?Discharge Plan and Services ?  ?Discharge Planning Services: CM Consult ?Post Acute Care Choice: Resumption of Svcs/PTA Provider          ?  ?  ?  ?  ?  ?  ?  ?  ?  ?  ? ?Social Determinants of Health (SDOH) Interventions ?  ? ? ?Readmission Risk Interventions ?   ? View : No data to display.  ?  ?  ?  ? ? ? ? ? ?

## 2021-06-15 DIAGNOSIS — Z9181 History of falling: Secondary | ICD-10-CM | POA: Diagnosis not present

## 2021-06-15 DIAGNOSIS — L989 Disorder of the skin and subcutaneous tissue, unspecified: Secondary | ICD-10-CM | POA: Diagnosis not present

## 2021-06-15 DIAGNOSIS — D696 Thrombocytopenia, unspecified: Secondary | ICD-10-CM | POA: Diagnosis not present

## 2021-06-15 DIAGNOSIS — Z7401 Bed confinement status: Secondary | ICD-10-CM | POA: Diagnosis not present

## 2021-06-15 DIAGNOSIS — G309 Alzheimer's disease, unspecified: Secondary | ICD-10-CM | POA: Diagnosis not present

## 2021-06-15 DIAGNOSIS — M1990 Unspecified osteoarthritis, unspecified site: Secondary | ICD-10-CM | POA: Diagnosis not present

## 2021-06-15 DIAGNOSIS — D649 Anemia, unspecified: Secondary | ICD-10-CM | POA: Diagnosis not present

## 2021-06-15 DIAGNOSIS — F0280 Dementia in other diseases classified elsewhere without behavioral disturbance: Secondary | ICD-10-CM | POA: Diagnosis not present

## 2021-06-15 DIAGNOSIS — R627 Adult failure to thrive: Secondary | ICD-10-CM | POA: Diagnosis not present

## 2021-06-15 DIAGNOSIS — I110 Hypertensive heart disease with heart failure: Secondary | ICD-10-CM | POA: Diagnosis not present

## 2021-06-15 DIAGNOSIS — Z8673 Personal history of transient ischemic attack (TIA), and cerebral infarction without residual deficits: Secondary | ICD-10-CM | POA: Diagnosis not present

## 2021-06-15 DIAGNOSIS — B961 Klebsiella pneumoniae [K. pneumoniae] as the cause of diseases classified elsewhere: Secondary | ICD-10-CM | POA: Diagnosis not present

## 2021-06-15 DIAGNOSIS — I5032 Chronic diastolic (congestive) heart failure: Secondary | ICD-10-CM | POA: Diagnosis not present

## 2021-06-15 DIAGNOSIS — N390 Urinary tract infection, site not specified: Secondary | ICD-10-CM | POA: Diagnosis not present

## 2021-06-21 DIAGNOSIS — R627 Adult failure to thrive: Secondary | ICD-10-CM | POA: Diagnosis not present

## 2021-06-21 DIAGNOSIS — D649 Anemia, unspecified: Secondary | ICD-10-CM | POA: Diagnosis not present

## 2021-06-21 DIAGNOSIS — F0280 Dementia in other diseases classified elsewhere without behavioral disturbance: Secondary | ICD-10-CM | POA: Diagnosis not present

## 2021-06-21 DIAGNOSIS — B961 Klebsiella pneumoniae [K. pneumoniae] as the cause of diseases classified elsewhere: Secondary | ICD-10-CM | POA: Diagnosis not present

## 2021-06-21 DIAGNOSIS — N390 Urinary tract infection, site not specified: Secondary | ICD-10-CM | POA: Diagnosis not present

## 2021-06-21 DIAGNOSIS — G309 Alzheimer's disease, unspecified: Secondary | ICD-10-CM | POA: Diagnosis not present

## 2021-06-23 DIAGNOSIS — F03918 Unspecified dementia, unspecified severity, with other behavioral disturbance: Secondary | ICD-10-CM | POA: Diagnosis not present

## 2021-06-23 DIAGNOSIS — G309 Alzheimer's disease, unspecified: Secondary | ICD-10-CM | POA: Diagnosis not present

## 2021-06-23 DIAGNOSIS — M1990 Unspecified osteoarthritis, unspecified site: Secondary | ICD-10-CM | POA: Diagnosis not present

## 2021-06-23 DIAGNOSIS — R627 Adult failure to thrive: Secondary | ICD-10-CM | POA: Diagnosis not present

## 2021-06-23 DIAGNOSIS — Z87440 Personal history of urinary (tract) infections: Secondary | ICD-10-CM | POA: Diagnosis not present

## 2021-06-23 DIAGNOSIS — D649 Anemia, unspecified: Secondary | ICD-10-CM | POA: Diagnosis not present

## 2021-06-23 DIAGNOSIS — I110 Hypertensive heart disease with heart failure: Secondary | ICD-10-CM | POA: Diagnosis not present

## 2021-06-23 DIAGNOSIS — I5030 Unspecified diastolic (congestive) heart failure: Secondary | ICD-10-CM | POA: Diagnosis not present

## 2021-06-23 DIAGNOSIS — F0280 Dementia in other diseases classified elsewhere without behavioral disturbance: Secondary | ICD-10-CM | POA: Diagnosis not present

## 2021-06-23 DIAGNOSIS — B961 Klebsiella pneumoniae [K. pneumoniae] as the cause of diseases classified elsewhere: Secondary | ICD-10-CM | POA: Diagnosis not present

## 2021-06-23 DIAGNOSIS — R54 Age-related physical debility: Secondary | ICD-10-CM | POA: Diagnosis not present

## 2021-06-23 DIAGNOSIS — N390 Urinary tract infection, site not specified: Secondary | ICD-10-CM | POA: Diagnosis not present

## 2021-06-24 DIAGNOSIS — D649 Anemia, unspecified: Secondary | ICD-10-CM | POA: Diagnosis not present

## 2021-06-24 DIAGNOSIS — R627 Adult failure to thrive: Secondary | ICD-10-CM | POA: Diagnosis not present

## 2021-06-24 DIAGNOSIS — F0280 Dementia in other diseases classified elsewhere without behavioral disturbance: Secondary | ICD-10-CM | POA: Diagnosis not present

## 2021-06-24 DIAGNOSIS — N390 Urinary tract infection, site not specified: Secondary | ICD-10-CM | POA: Diagnosis not present

## 2021-06-24 DIAGNOSIS — B961 Klebsiella pneumoniae [K. pneumoniae] as the cause of diseases classified elsewhere: Secondary | ICD-10-CM | POA: Diagnosis not present

## 2021-06-24 DIAGNOSIS — G309 Alzheimer's disease, unspecified: Secondary | ICD-10-CM | POA: Diagnosis not present

## 2021-06-28 DIAGNOSIS — F0392 Unspecified dementia, unspecified severity, with psychotic disturbance: Secondary | ICD-10-CM | POA: Diagnosis not present

## 2021-06-28 DIAGNOSIS — I5030 Unspecified diastolic (congestive) heart failure: Secondary | ICD-10-CM | POA: Diagnosis not present

## 2021-06-28 DIAGNOSIS — N390 Urinary tract infection, site not specified: Secondary | ICD-10-CM | POA: Diagnosis not present

## 2021-06-28 DIAGNOSIS — R54 Age-related physical debility: Secondary | ICD-10-CM | POA: Diagnosis not present

## 2021-06-28 DIAGNOSIS — K5900 Constipation, unspecified: Secondary | ICD-10-CM | POA: Diagnosis not present

## 2021-06-29 DIAGNOSIS — D649 Anemia, unspecified: Secondary | ICD-10-CM | POA: Diagnosis not present

## 2021-06-29 DIAGNOSIS — F0280 Dementia in other diseases classified elsewhere without behavioral disturbance: Secondary | ICD-10-CM | POA: Diagnosis not present

## 2021-06-29 DIAGNOSIS — B961 Klebsiella pneumoniae [K. pneumoniae] as the cause of diseases classified elsewhere: Secondary | ICD-10-CM | POA: Diagnosis not present

## 2021-06-29 DIAGNOSIS — R627 Adult failure to thrive: Secondary | ICD-10-CM | POA: Diagnosis not present

## 2021-06-29 DIAGNOSIS — G309 Alzheimer's disease, unspecified: Secondary | ICD-10-CM | POA: Diagnosis not present

## 2021-06-29 DIAGNOSIS — N390 Urinary tract infection, site not specified: Secondary | ICD-10-CM | POA: Diagnosis not present

## 2021-06-30 DIAGNOSIS — G309 Alzheimer's disease, unspecified: Secondary | ICD-10-CM | POA: Diagnosis not present

## 2021-06-30 DIAGNOSIS — B961 Klebsiella pneumoniae [K. pneumoniae] as the cause of diseases classified elsewhere: Secondary | ICD-10-CM | POA: Diagnosis not present

## 2021-06-30 DIAGNOSIS — R627 Adult failure to thrive: Secondary | ICD-10-CM | POA: Diagnosis not present

## 2021-06-30 DIAGNOSIS — F0280 Dementia in other diseases classified elsewhere without behavioral disturbance: Secondary | ICD-10-CM | POA: Diagnosis not present

## 2021-06-30 DIAGNOSIS — D649 Anemia, unspecified: Secondary | ICD-10-CM | POA: Diagnosis not present

## 2021-06-30 DIAGNOSIS — N390 Urinary tract infection, site not specified: Secondary | ICD-10-CM | POA: Diagnosis not present

## 2021-07-01 DIAGNOSIS — G309 Alzheimer's disease, unspecified: Secondary | ICD-10-CM | POA: Diagnosis not present

## 2021-07-01 DIAGNOSIS — B961 Klebsiella pneumoniae [K. pneumoniae] as the cause of diseases classified elsewhere: Secondary | ICD-10-CM | POA: Diagnosis not present

## 2021-07-01 DIAGNOSIS — D649 Anemia, unspecified: Secondary | ICD-10-CM | POA: Diagnosis not present

## 2021-07-01 DIAGNOSIS — F0280 Dementia in other diseases classified elsewhere without behavioral disturbance: Secondary | ICD-10-CM | POA: Diagnosis not present

## 2021-07-01 DIAGNOSIS — N390 Urinary tract infection, site not specified: Secondary | ICD-10-CM | POA: Diagnosis not present

## 2021-07-01 DIAGNOSIS — R627 Adult failure to thrive: Secondary | ICD-10-CM | POA: Diagnosis not present

## 2021-07-02 DIAGNOSIS — R627 Adult failure to thrive: Secondary | ICD-10-CM | POA: Diagnosis not present

## 2021-07-02 DIAGNOSIS — I110 Hypertensive heart disease with heart failure: Secondary | ICD-10-CM | POA: Diagnosis not present

## 2021-07-02 DIAGNOSIS — M1990 Unspecified osteoarthritis, unspecified site: Secondary | ICD-10-CM | POA: Diagnosis not present

## 2021-07-02 DIAGNOSIS — Z87440 Personal history of urinary (tract) infections: Secondary | ICD-10-CM | POA: Diagnosis not present

## 2021-07-02 DIAGNOSIS — D696 Thrombocytopenia, unspecified: Secondary | ICD-10-CM | POA: Diagnosis not present

## 2021-07-02 DIAGNOSIS — Z8673 Personal history of transient ischemic attack (TIA), and cerebral infarction without residual deficits: Secondary | ICD-10-CM | POA: Diagnosis not present

## 2021-07-02 DIAGNOSIS — F03918 Unspecified dementia, unspecified severity, with other behavioral disturbance: Secondary | ICD-10-CM | POA: Diagnosis not present

## 2021-07-02 DIAGNOSIS — M4802 Spinal stenosis, cervical region: Secondary | ICD-10-CM | POA: Diagnosis not present

## 2021-07-02 DIAGNOSIS — D509 Iron deficiency anemia, unspecified: Secondary | ICD-10-CM | POA: Diagnosis not present

## 2021-07-02 DIAGNOSIS — E860 Dehydration: Secondary | ICD-10-CM | POA: Diagnosis not present

## 2021-07-04 DIAGNOSIS — Z8673 Personal history of transient ischemic attack (TIA), and cerebral infarction without residual deficits: Secondary | ICD-10-CM | POA: Diagnosis not present

## 2021-07-04 DIAGNOSIS — M4802 Spinal stenosis, cervical region: Secondary | ICD-10-CM | POA: Diagnosis not present

## 2021-07-04 DIAGNOSIS — D509 Iron deficiency anemia, unspecified: Secondary | ICD-10-CM | POA: Diagnosis not present

## 2021-07-04 DIAGNOSIS — E860 Dehydration: Secondary | ICD-10-CM | POA: Diagnosis not present

## 2021-07-04 DIAGNOSIS — D696 Thrombocytopenia, unspecified: Secondary | ICD-10-CM | POA: Diagnosis not present

## 2021-07-04 DIAGNOSIS — R627 Adult failure to thrive: Secondary | ICD-10-CM | POA: Diagnosis not present

## 2021-07-04 DIAGNOSIS — M1990 Unspecified osteoarthritis, unspecified site: Secondary | ICD-10-CM | POA: Diagnosis not present

## 2021-07-04 DIAGNOSIS — F03918 Unspecified dementia, unspecified severity, with other behavioral disturbance: Secondary | ICD-10-CM | POA: Diagnosis not present

## 2021-07-04 DIAGNOSIS — I110 Hypertensive heart disease with heart failure: Secondary | ICD-10-CM | POA: Diagnosis not present

## 2021-07-04 DIAGNOSIS — Z87440 Personal history of urinary (tract) infections: Secondary | ICD-10-CM | POA: Diagnosis not present

## 2021-07-05 DIAGNOSIS — R627 Adult failure to thrive: Secondary | ICD-10-CM | POA: Diagnosis not present

## 2021-07-05 DIAGNOSIS — F0280 Dementia in other diseases classified elsewhere without behavioral disturbance: Secondary | ICD-10-CM | POA: Diagnosis not present

## 2021-07-05 DIAGNOSIS — B961 Klebsiella pneumoniae [K. pneumoniae] as the cause of diseases classified elsewhere: Secondary | ICD-10-CM | POA: Diagnosis not present

## 2021-07-05 DIAGNOSIS — N390 Urinary tract infection, site not specified: Secondary | ICD-10-CM | POA: Diagnosis not present

## 2021-07-05 DIAGNOSIS — G309 Alzheimer's disease, unspecified: Secondary | ICD-10-CM | POA: Diagnosis not present

## 2021-07-05 DIAGNOSIS — D649 Anemia, unspecified: Secondary | ICD-10-CM | POA: Diagnosis not present

## 2021-07-07 DIAGNOSIS — B961 Klebsiella pneumoniae [K. pneumoniae] as the cause of diseases classified elsewhere: Secondary | ICD-10-CM | POA: Diagnosis not present

## 2021-07-07 DIAGNOSIS — D649 Anemia, unspecified: Secondary | ICD-10-CM | POA: Diagnosis not present

## 2021-07-07 DIAGNOSIS — F0280 Dementia in other diseases classified elsewhere without behavioral disturbance: Secondary | ICD-10-CM | POA: Diagnosis not present

## 2021-07-07 DIAGNOSIS — R627 Adult failure to thrive: Secondary | ICD-10-CM | POA: Diagnosis not present

## 2021-07-07 DIAGNOSIS — G309 Alzheimer's disease, unspecified: Secondary | ICD-10-CM | POA: Diagnosis not present

## 2021-07-07 DIAGNOSIS — N390 Urinary tract infection, site not specified: Secondary | ICD-10-CM | POA: Diagnosis not present

## 2021-07-08 DIAGNOSIS — N390 Urinary tract infection, site not specified: Secondary | ICD-10-CM | POA: Diagnosis not present

## 2021-07-08 DIAGNOSIS — D649 Anemia, unspecified: Secondary | ICD-10-CM | POA: Diagnosis not present

## 2021-07-08 DIAGNOSIS — F0280 Dementia in other diseases classified elsewhere without behavioral disturbance: Secondary | ICD-10-CM | POA: Diagnosis not present

## 2021-07-08 DIAGNOSIS — G309 Alzheimer's disease, unspecified: Secondary | ICD-10-CM | POA: Diagnosis not present

## 2021-07-08 DIAGNOSIS — R627 Adult failure to thrive: Secondary | ICD-10-CM | POA: Diagnosis not present

## 2021-07-08 DIAGNOSIS — B961 Klebsiella pneumoniae [K. pneumoniae] as the cause of diseases classified elsewhere: Secondary | ICD-10-CM | POA: Diagnosis not present

## 2021-07-11 DIAGNOSIS — N390 Urinary tract infection, site not specified: Secondary | ICD-10-CM | POA: Diagnosis not present

## 2021-07-11 DIAGNOSIS — B961 Klebsiella pneumoniae [K. pneumoniae] as the cause of diseases classified elsewhere: Secondary | ICD-10-CM | POA: Diagnosis not present

## 2021-07-11 DIAGNOSIS — G309 Alzheimer's disease, unspecified: Secondary | ICD-10-CM | POA: Diagnosis not present

## 2021-07-11 DIAGNOSIS — R627 Adult failure to thrive: Secondary | ICD-10-CM | POA: Diagnosis not present

## 2021-07-11 DIAGNOSIS — F0280 Dementia in other diseases classified elsewhere without behavioral disturbance: Secondary | ICD-10-CM | POA: Diagnosis not present

## 2021-07-11 DIAGNOSIS — D649 Anemia, unspecified: Secondary | ICD-10-CM | POA: Diagnosis not present

## 2021-07-12 DIAGNOSIS — Z87440 Personal history of urinary (tract) infections: Secondary | ICD-10-CM | POA: Diagnosis not present

## 2021-07-12 DIAGNOSIS — F0392 Unspecified dementia, unspecified severity, with psychotic disturbance: Secondary | ICD-10-CM | POA: Diagnosis not present

## 2021-07-12 DIAGNOSIS — R54 Age-related physical debility: Secondary | ICD-10-CM | POA: Diagnosis not present

## 2021-07-13 DIAGNOSIS — R627 Adult failure to thrive: Secondary | ICD-10-CM | POA: Diagnosis not present

## 2021-07-13 DIAGNOSIS — F0280 Dementia in other diseases classified elsewhere without behavioral disturbance: Secondary | ICD-10-CM | POA: Diagnosis not present

## 2021-07-13 DIAGNOSIS — N390 Urinary tract infection, site not specified: Secondary | ICD-10-CM | POA: Diagnosis not present

## 2021-07-13 DIAGNOSIS — D649 Anemia, unspecified: Secondary | ICD-10-CM | POA: Diagnosis not present

## 2021-07-13 DIAGNOSIS — G309 Alzheimer's disease, unspecified: Secondary | ICD-10-CM | POA: Diagnosis not present

## 2021-07-13 DIAGNOSIS — B961 Klebsiella pneumoniae [K. pneumoniae] as the cause of diseases classified elsewhere: Secondary | ICD-10-CM | POA: Diagnosis not present

## 2021-07-14 DIAGNOSIS — R627 Adult failure to thrive: Secondary | ICD-10-CM | POA: Diagnosis not present

## 2021-07-14 DIAGNOSIS — G309 Alzheimer's disease, unspecified: Secondary | ICD-10-CM | POA: Diagnosis not present

## 2021-07-14 DIAGNOSIS — D649 Anemia, unspecified: Secondary | ICD-10-CM | POA: Diagnosis not present

## 2021-07-14 DIAGNOSIS — N390 Urinary tract infection, site not specified: Secondary | ICD-10-CM | POA: Diagnosis not present

## 2021-07-14 DIAGNOSIS — B961 Klebsiella pneumoniae [K. pneumoniae] as the cause of diseases classified elsewhere: Secondary | ICD-10-CM | POA: Diagnosis not present

## 2021-07-14 DIAGNOSIS — F0280 Dementia in other diseases classified elsewhere without behavioral disturbance: Secondary | ICD-10-CM | POA: Diagnosis not present

## 2021-07-15 DIAGNOSIS — N390 Urinary tract infection, site not specified: Secondary | ICD-10-CM | POA: Diagnosis not present

## 2021-07-15 DIAGNOSIS — R627 Adult failure to thrive: Secondary | ICD-10-CM | POA: Diagnosis not present

## 2021-07-15 DIAGNOSIS — I5032 Chronic diastolic (congestive) heart failure: Secondary | ICD-10-CM | POA: Diagnosis not present

## 2021-07-15 DIAGNOSIS — F0280 Dementia in other diseases classified elsewhere without behavioral disturbance: Secondary | ICD-10-CM | POA: Diagnosis not present

## 2021-07-15 DIAGNOSIS — G309 Alzheimer's disease, unspecified: Secondary | ICD-10-CM | POA: Diagnosis not present

## 2021-07-15 DIAGNOSIS — L989 Disorder of the skin and subcutaneous tissue, unspecified: Secondary | ICD-10-CM | POA: Diagnosis not present

## 2021-07-15 DIAGNOSIS — Z8673 Personal history of transient ischemic attack (TIA), and cerebral infarction without residual deficits: Secondary | ICD-10-CM | POA: Diagnosis not present

## 2021-07-15 DIAGNOSIS — B961 Klebsiella pneumoniae [K. pneumoniae] as the cause of diseases classified elsewhere: Secondary | ICD-10-CM | POA: Diagnosis not present

## 2021-07-15 DIAGNOSIS — D696 Thrombocytopenia, unspecified: Secondary | ICD-10-CM | POA: Diagnosis not present

## 2021-07-15 DIAGNOSIS — M1990 Unspecified osteoarthritis, unspecified site: Secondary | ICD-10-CM | POA: Diagnosis not present

## 2021-07-15 DIAGNOSIS — I110 Hypertensive heart disease with heart failure: Secondary | ICD-10-CM | POA: Diagnosis not present

## 2021-07-15 DIAGNOSIS — D649 Anemia, unspecified: Secondary | ICD-10-CM | POA: Diagnosis not present

## 2021-07-15 DIAGNOSIS — Z7401 Bed confinement status: Secondary | ICD-10-CM | POA: Diagnosis not present

## 2021-07-15 DIAGNOSIS — Z9181 History of falling: Secondary | ICD-10-CM | POA: Diagnosis not present

## 2021-07-18 DIAGNOSIS — D649 Anemia, unspecified: Secondary | ICD-10-CM | POA: Diagnosis not present

## 2021-07-18 DIAGNOSIS — B961 Klebsiella pneumoniae [K. pneumoniae] as the cause of diseases classified elsewhere: Secondary | ICD-10-CM | POA: Diagnosis not present

## 2021-07-18 DIAGNOSIS — F0280 Dementia in other diseases classified elsewhere without behavioral disturbance: Secondary | ICD-10-CM | POA: Diagnosis not present

## 2021-07-18 DIAGNOSIS — R627 Adult failure to thrive: Secondary | ICD-10-CM | POA: Diagnosis not present

## 2021-07-18 DIAGNOSIS — N390 Urinary tract infection, site not specified: Secondary | ICD-10-CM | POA: Diagnosis not present

## 2021-07-18 DIAGNOSIS — G309 Alzheimer's disease, unspecified: Secondary | ICD-10-CM | POA: Diagnosis not present

## 2021-07-21 DIAGNOSIS — F0280 Dementia in other diseases classified elsewhere without behavioral disturbance: Secondary | ICD-10-CM | POA: Diagnosis not present

## 2021-07-21 DIAGNOSIS — R627 Adult failure to thrive: Secondary | ICD-10-CM | POA: Diagnosis not present

## 2021-07-21 DIAGNOSIS — F03918 Unspecified dementia, unspecified severity, with other behavioral disturbance: Secondary | ICD-10-CM | POA: Diagnosis not present

## 2021-07-21 DIAGNOSIS — G309 Alzheimer's disease, unspecified: Secondary | ICD-10-CM | POA: Diagnosis not present

## 2021-07-21 DIAGNOSIS — N390 Urinary tract infection, site not specified: Secondary | ICD-10-CM | POA: Diagnosis not present

## 2021-07-21 DIAGNOSIS — D649 Anemia, unspecified: Secondary | ICD-10-CM | POA: Diagnosis not present

## 2021-07-21 DIAGNOSIS — Z87440 Personal history of urinary (tract) infections: Secondary | ICD-10-CM | POA: Diagnosis not present

## 2021-07-21 DIAGNOSIS — B961 Klebsiella pneumoniae [K. pneumoniae] as the cause of diseases classified elsewhere: Secondary | ICD-10-CM | POA: Diagnosis not present

## 2021-07-25 DIAGNOSIS — G309 Alzheimer's disease, unspecified: Secondary | ICD-10-CM | POA: Diagnosis not present

## 2021-07-25 DIAGNOSIS — B961 Klebsiella pneumoniae [K. pneumoniae] as the cause of diseases classified elsewhere: Secondary | ICD-10-CM | POA: Diagnosis not present

## 2021-07-25 DIAGNOSIS — D649 Anemia, unspecified: Secondary | ICD-10-CM | POA: Diagnosis not present

## 2021-07-25 DIAGNOSIS — F0280 Dementia in other diseases classified elsewhere without behavioral disturbance: Secondary | ICD-10-CM | POA: Diagnosis not present

## 2021-07-25 DIAGNOSIS — R627 Adult failure to thrive: Secondary | ICD-10-CM | POA: Diagnosis not present

## 2021-07-25 DIAGNOSIS — N390 Urinary tract infection, site not specified: Secondary | ICD-10-CM | POA: Diagnosis not present

## 2021-07-27 DIAGNOSIS — F0280 Dementia in other diseases classified elsewhere without behavioral disturbance: Secondary | ICD-10-CM | POA: Diagnosis not present

## 2021-07-27 DIAGNOSIS — N390 Urinary tract infection, site not specified: Secondary | ICD-10-CM | POA: Diagnosis not present

## 2021-07-27 DIAGNOSIS — G309 Alzheimer's disease, unspecified: Secondary | ICD-10-CM | POA: Diagnosis not present

## 2021-07-27 DIAGNOSIS — R627 Adult failure to thrive: Secondary | ICD-10-CM | POA: Diagnosis not present

## 2021-07-27 DIAGNOSIS — B961 Klebsiella pneumoniae [K. pneumoniae] as the cause of diseases classified elsewhere: Secondary | ICD-10-CM | POA: Diagnosis not present

## 2021-07-27 DIAGNOSIS — D649 Anemia, unspecified: Secondary | ICD-10-CM | POA: Diagnosis not present

## 2021-07-28 DIAGNOSIS — B961 Klebsiella pneumoniae [K. pneumoniae] as the cause of diseases classified elsewhere: Secondary | ICD-10-CM | POA: Diagnosis not present

## 2021-07-28 DIAGNOSIS — D649 Anemia, unspecified: Secondary | ICD-10-CM | POA: Diagnosis not present

## 2021-07-28 DIAGNOSIS — F0280 Dementia in other diseases classified elsewhere without behavioral disturbance: Secondary | ICD-10-CM | POA: Diagnosis not present

## 2021-07-28 DIAGNOSIS — R627 Adult failure to thrive: Secondary | ICD-10-CM | POA: Diagnosis not present

## 2021-07-28 DIAGNOSIS — N390 Urinary tract infection, site not specified: Secondary | ICD-10-CM | POA: Diagnosis not present

## 2021-07-28 DIAGNOSIS — G309 Alzheimer's disease, unspecified: Secondary | ICD-10-CM | POA: Diagnosis not present

## 2021-08-01 DIAGNOSIS — D649 Anemia, unspecified: Secondary | ICD-10-CM | POA: Diagnosis not present

## 2021-08-01 DIAGNOSIS — N390 Urinary tract infection, site not specified: Secondary | ICD-10-CM | POA: Diagnosis not present

## 2021-08-01 DIAGNOSIS — B961 Klebsiella pneumoniae [K. pneumoniae] as the cause of diseases classified elsewhere: Secondary | ICD-10-CM | POA: Diagnosis not present

## 2021-08-01 DIAGNOSIS — G309 Alzheimer's disease, unspecified: Secondary | ICD-10-CM | POA: Diagnosis not present

## 2021-08-01 DIAGNOSIS — R627 Adult failure to thrive: Secondary | ICD-10-CM | POA: Diagnosis not present

## 2021-08-01 DIAGNOSIS — F0280 Dementia in other diseases classified elsewhere without behavioral disturbance: Secondary | ICD-10-CM | POA: Diagnosis not present

## 2021-08-03 DIAGNOSIS — B961 Klebsiella pneumoniae [K. pneumoniae] as the cause of diseases classified elsewhere: Secondary | ICD-10-CM | POA: Diagnosis not present

## 2021-08-03 DIAGNOSIS — D649 Anemia, unspecified: Secondary | ICD-10-CM | POA: Diagnosis not present

## 2021-08-03 DIAGNOSIS — N390 Urinary tract infection, site not specified: Secondary | ICD-10-CM | POA: Diagnosis not present

## 2021-08-03 DIAGNOSIS — R627 Adult failure to thrive: Secondary | ICD-10-CM | POA: Diagnosis not present

## 2021-08-03 DIAGNOSIS — F0280 Dementia in other diseases classified elsewhere without behavioral disturbance: Secondary | ICD-10-CM | POA: Diagnosis not present

## 2021-08-03 DIAGNOSIS — G309 Alzheimer's disease, unspecified: Secondary | ICD-10-CM | POA: Diagnosis not present

## 2021-08-06 DIAGNOSIS — F0280 Dementia in other diseases classified elsewhere without behavioral disturbance: Secondary | ICD-10-CM | POA: Diagnosis not present

## 2021-08-06 DIAGNOSIS — D649 Anemia, unspecified: Secondary | ICD-10-CM | POA: Diagnosis not present

## 2021-08-06 DIAGNOSIS — N390 Urinary tract infection, site not specified: Secondary | ICD-10-CM | POA: Diagnosis not present

## 2021-08-06 DIAGNOSIS — G309 Alzheimer's disease, unspecified: Secondary | ICD-10-CM | POA: Diagnosis not present

## 2021-08-06 DIAGNOSIS — B961 Klebsiella pneumoniae [K. pneumoniae] as the cause of diseases classified elsewhere: Secondary | ICD-10-CM | POA: Diagnosis not present

## 2021-08-06 DIAGNOSIS — R627 Adult failure to thrive: Secondary | ICD-10-CM | POA: Diagnosis not present

## 2021-08-08 DIAGNOSIS — R627 Adult failure to thrive: Secondary | ICD-10-CM | POA: Diagnosis not present

## 2021-08-08 DIAGNOSIS — F0280 Dementia in other diseases classified elsewhere without behavioral disturbance: Secondary | ICD-10-CM | POA: Diagnosis not present

## 2021-08-08 DIAGNOSIS — G309 Alzheimer's disease, unspecified: Secondary | ICD-10-CM | POA: Diagnosis not present

## 2021-08-08 DIAGNOSIS — N390 Urinary tract infection, site not specified: Secondary | ICD-10-CM | POA: Diagnosis not present

## 2021-08-08 DIAGNOSIS — D649 Anemia, unspecified: Secondary | ICD-10-CM | POA: Diagnosis not present

## 2021-08-08 DIAGNOSIS — B961 Klebsiella pneumoniae [K. pneumoniae] as the cause of diseases classified elsewhere: Secondary | ICD-10-CM | POA: Diagnosis not present

## 2021-08-10 DIAGNOSIS — D649 Anemia, unspecified: Secondary | ICD-10-CM | POA: Diagnosis not present

## 2021-08-10 DIAGNOSIS — F0280 Dementia in other diseases classified elsewhere without behavioral disturbance: Secondary | ICD-10-CM | POA: Diagnosis not present

## 2021-08-10 DIAGNOSIS — N390 Urinary tract infection, site not specified: Secondary | ICD-10-CM | POA: Diagnosis not present

## 2021-08-10 DIAGNOSIS — B961 Klebsiella pneumoniae [K. pneumoniae] as the cause of diseases classified elsewhere: Secondary | ICD-10-CM | POA: Diagnosis not present

## 2021-08-10 DIAGNOSIS — G309 Alzheimer's disease, unspecified: Secondary | ICD-10-CM | POA: Diagnosis not present

## 2021-08-10 DIAGNOSIS — R627 Adult failure to thrive: Secondary | ICD-10-CM | POA: Diagnosis not present

## 2021-08-11 DIAGNOSIS — G309 Alzheimer's disease, unspecified: Secondary | ICD-10-CM | POA: Diagnosis not present

## 2021-08-11 DIAGNOSIS — D649 Anemia, unspecified: Secondary | ICD-10-CM | POA: Diagnosis not present

## 2021-08-11 DIAGNOSIS — N390 Urinary tract infection, site not specified: Secondary | ICD-10-CM | POA: Diagnosis not present

## 2021-08-11 DIAGNOSIS — R627 Adult failure to thrive: Secondary | ICD-10-CM | POA: Diagnosis not present

## 2021-08-11 DIAGNOSIS — F0280 Dementia in other diseases classified elsewhere without behavioral disturbance: Secondary | ICD-10-CM | POA: Diagnosis not present

## 2021-08-11 DIAGNOSIS — B961 Klebsiella pneumoniae [K. pneumoniae] as the cause of diseases classified elsewhere: Secondary | ICD-10-CM | POA: Diagnosis not present

## 2021-08-16 DIAGNOSIS — K5901 Slow transit constipation: Secondary | ICD-10-CM | POA: Diagnosis not present

## 2021-08-16 DIAGNOSIS — E440 Moderate protein-calorie malnutrition: Secondary | ICD-10-CM | POA: Diagnosis not present

## 2021-08-16 DIAGNOSIS — F03918 Unspecified dementia, unspecified severity, with other behavioral disturbance: Secondary | ICD-10-CM | POA: Diagnosis not present

## 2021-08-16 DIAGNOSIS — R54 Age-related physical debility: Secondary | ICD-10-CM | POA: Diagnosis not present

## 2021-08-29 DIAGNOSIS — R627 Adult failure to thrive: Secondary | ICD-10-CM | POA: Diagnosis not present

## 2021-08-29 DIAGNOSIS — F03918 Unspecified dementia, unspecified severity, with other behavioral disturbance: Secondary | ICD-10-CM | POA: Diagnosis not present

## 2021-08-29 DIAGNOSIS — M1990 Unspecified osteoarthritis, unspecified site: Secondary | ICD-10-CM | POA: Diagnosis not present

## 2021-09-13 DIAGNOSIS — K117 Disturbances of salivary secretion: Secondary | ICD-10-CM | POA: Diagnosis not present

## 2021-09-13 DIAGNOSIS — F0392 Unspecified dementia, unspecified severity, with psychotic disturbance: Secondary | ICD-10-CM | POA: Diagnosis not present

## 2021-10-10 DIAGNOSIS — R54 Age-related physical debility: Secondary | ICD-10-CM | POA: Diagnosis not present

## 2021-10-10 DIAGNOSIS — K117 Disturbances of salivary secretion: Secondary | ICD-10-CM | POA: Diagnosis not present

## 2021-10-10 DIAGNOSIS — R627 Adult failure to thrive: Secondary | ICD-10-CM | POA: Diagnosis not present

## 2021-10-10 DIAGNOSIS — K5901 Slow transit constipation: Secondary | ICD-10-CM | POA: Diagnosis not present

## 2021-10-10 DIAGNOSIS — F0392 Unspecified dementia, unspecified severity, with psychotic disturbance: Secondary | ICD-10-CM | POA: Diagnosis not present

## 2021-10-20 DIAGNOSIS — I110 Hypertensive heart disease with heart failure: Secondary | ICD-10-CM | POA: Diagnosis not present

## 2021-10-20 DIAGNOSIS — R54 Age-related physical debility: Secondary | ICD-10-CM | POA: Diagnosis not present

## 2021-10-20 DIAGNOSIS — I5030 Unspecified diastolic (congestive) heart failure: Secondary | ICD-10-CM | POA: Diagnosis not present

## 2021-10-20 DIAGNOSIS — D509 Iron deficiency anemia, unspecified: Secondary | ICD-10-CM | POA: Diagnosis not present

## 2021-10-20 DIAGNOSIS — M1990 Unspecified osteoarthritis, unspecified site: Secondary | ICD-10-CM | POA: Diagnosis not present

## 2021-10-31 DIAGNOSIS — E440 Moderate protein-calorie malnutrition: Secondary | ICD-10-CM | POA: Diagnosis not present

## 2021-10-31 DIAGNOSIS — D696 Thrombocytopenia, unspecified: Secondary | ICD-10-CM | POA: Diagnosis not present

## 2021-10-31 DIAGNOSIS — F03918 Unspecified dementia, unspecified severity, with other behavioral disturbance: Secondary | ICD-10-CM | POA: Diagnosis not present

## 2021-10-31 DIAGNOSIS — N3000 Acute cystitis without hematuria: Secondary | ICD-10-CM | POA: Diagnosis not present

## 2021-10-31 DIAGNOSIS — M1990 Unspecified osteoarthritis, unspecified site: Secondary | ICD-10-CM | POA: Diagnosis not present

## 2021-11-09 DIAGNOSIS — M1990 Unspecified osteoarthritis, unspecified site: Secondary | ICD-10-CM | POA: Diagnosis not present

## 2021-11-09 DIAGNOSIS — R54 Age-related physical debility: Secondary | ICD-10-CM | POA: Diagnosis not present

## 2021-11-09 DIAGNOSIS — I110 Hypertensive heart disease with heart failure: Secondary | ICD-10-CM | POA: Diagnosis not present

## 2021-11-09 DIAGNOSIS — I5030 Unspecified diastolic (congestive) heart failure: Secondary | ICD-10-CM | POA: Diagnosis not present

## 2021-11-09 DIAGNOSIS — D509 Iron deficiency anemia, unspecified: Secondary | ICD-10-CM | POA: Diagnosis not present

## 2021-11-23 DIAGNOSIS — F03911 Unspecified dementia, unspecified severity, with agitation: Secondary | ICD-10-CM | POA: Diagnosis not present

## 2021-11-29 DIAGNOSIS — M1990 Unspecified osteoarthritis, unspecified site: Secondary | ICD-10-CM | POA: Diagnosis not present

## 2021-11-29 DIAGNOSIS — I5030 Unspecified diastolic (congestive) heart failure: Secondary | ICD-10-CM | POA: Diagnosis not present

## 2021-11-29 DIAGNOSIS — I110 Hypertensive heart disease with heart failure: Secondary | ICD-10-CM | POA: Diagnosis not present

## 2021-11-29 DIAGNOSIS — D509 Iron deficiency anemia, unspecified: Secondary | ICD-10-CM | POA: Diagnosis not present

## 2021-11-29 DIAGNOSIS — R54 Age-related physical debility: Secondary | ICD-10-CM | POA: Diagnosis not present

## 2021-11-30 DIAGNOSIS — K117 Disturbances of salivary secretion: Secondary | ICD-10-CM | POA: Diagnosis not present

## 2021-11-30 DIAGNOSIS — F03918 Unspecified dementia, unspecified severity, with other behavioral disturbance: Secondary | ICD-10-CM | POA: Diagnosis not present

## 2021-11-30 DIAGNOSIS — Z23 Encounter for immunization: Secondary | ICD-10-CM | POA: Diagnosis not present

## 2021-11-30 DIAGNOSIS — R54 Age-related physical debility: Secondary | ICD-10-CM | POA: Diagnosis not present

## 2022-01-04 DIAGNOSIS — R54 Age-related physical debility: Secondary | ICD-10-CM | POA: Diagnosis not present

## 2022-01-04 DIAGNOSIS — K5901 Slow transit constipation: Secondary | ICD-10-CM | POA: Diagnosis not present

## 2022-01-04 DIAGNOSIS — R627 Adult failure to thrive: Secondary | ICD-10-CM | POA: Diagnosis not present

## 2022-01-04 DIAGNOSIS — K117 Disturbances of salivary secretion: Secondary | ICD-10-CM | POA: Diagnosis not present

## 2022-01-04 DIAGNOSIS — F0392 Unspecified dementia, unspecified severity, with psychotic disturbance: Secondary | ICD-10-CM | POA: Diagnosis not present

## 2022-01-19 DIAGNOSIS — R54 Age-related physical debility: Secondary | ICD-10-CM | POA: Diagnosis not present

## 2022-01-19 DIAGNOSIS — I110 Hypertensive heart disease with heart failure: Secondary | ICD-10-CM | POA: Diagnosis not present

## 2022-01-19 DIAGNOSIS — D509 Iron deficiency anemia, unspecified: Secondary | ICD-10-CM | POA: Diagnosis not present

## 2022-01-19 DIAGNOSIS — I5030 Unspecified diastolic (congestive) heart failure: Secondary | ICD-10-CM | POA: Diagnosis not present

## 2022-01-19 DIAGNOSIS — M1990 Unspecified osteoarthritis, unspecified site: Secondary | ICD-10-CM | POA: Diagnosis not present

## 2022-01-30 DIAGNOSIS — F03918 Unspecified dementia, unspecified severity, with other behavioral disturbance: Secondary | ICD-10-CM | POA: Diagnosis not present

## 2022-01-30 DIAGNOSIS — K5901 Slow transit constipation: Secondary | ICD-10-CM | POA: Diagnosis not present

## 2022-01-31 DIAGNOSIS — R54 Age-related physical debility: Secondary | ICD-10-CM | POA: Diagnosis not present

## 2022-01-31 DIAGNOSIS — M1990 Unspecified osteoarthritis, unspecified site: Secondary | ICD-10-CM | POA: Diagnosis not present

## 2022-01-31 DIAGNOSIS — I5030 Unspecified diastolic (congestive) heart failure: Secondary | ICD-10-CM | POA: Diagnosis not present

## 2022-01-31 DIAGNOSIS — I110 Hypertensive heart disease with heart failure: Secondary | ICD-10-CM | POA: Diagnosis not present

## 2022-01-31 DIAGNOSIS — D509 Iron deficiency anemia, unspecified: Secondary | ICD-10-CM | POA: Diagnosis not present

## 2022-02-01 DIAGNOSIS — J069 Acute upper respiratory infection, unspecified: Secondary | ICD-10-CM | POA: Diagnosis not present

## 2022-02-01 DIAGNOSIS — F03918 Unspecified dementia, unspecified severity, with other behavioral disturbance: Secondary | ICD-10-CM | POA: Diagnosis not present

## 2022-02-01 DIAGNOSIS — R1312 Dysphagia, oropharyngeal phase: Secondary | ICD-10-CM | POA: Diagnosis not present

## 2022-02-01 DIAGNOSIS — K5901 Slow transit constipation: Secondary | ICD-10-CM | POA: Diagnosis not present

## 2022-02-01 DIAGNOSIS — R54 Age-related physical debility: Secondary | ICD-10-CM | POA: Diagnosis not present

## 2022-07-24 ENCOUNTER — Inpatient Hospital Stay (HOSPITAL_COMMUNITY)
Admission: EM | Admit: 2022-07-24 | Discharge: 2022-07-28 | DRG: 071 | Disposition: A | Discharge status: Home / Self Care | Payer: Medicare PPO | Attending: Internal Medicine | Admitting: Internal Medicine

## 2022-07-24 ENCOUNTER — Emergency Department (HOSPITAL_COMMUNITY): Payer: Medicare PPO

## 2022-07-24 ENCOUNTER — Observation Stay (HOSPITAL_COMMUNITY): Payer: Medicare PPO

## 2022-07-24 ENCOUNTER — Encounter (HOSPITAL_COMMUNITY): Payer: Self-pay

## 2022-07-24 ENCOUNTER — Other Ambulatory Visit: Payer: Self-pay

## 2022-07-24 DIAGNOSIS — I679 Cerebrovascular disease, unspecified: Secondary | ICD-10-CM | POA: Insufficient documentation

## 2022-07-24 DIAGNOSIS — Z882 Allergy status to sulfonamides status: Secondary | ICD-10-CM

## 2022-07-24 DIAGNOSIS — R569 Unspecified convulsions: Secondary | ICD-10-CM

## 2022-07-24 DIAGNOSIS — G9340 Encephalopathy, unspecified: Secondary | ICD-10-CM | POA: Diagnosis not present

## 2022-07-24 DIAGNOSIS — D61818 Other pancytopenia: Secondary | ICD-10-CM

## 2022-07-24 DIAGNOSIS — I4891 Unspecified atrial fibrillation: Secondary | ICD-10-CM

## 2022-07-24 DIAGNOSIS — F0280 Dementia in other diseases classified elsewhere without behavioral disturbance: Secondary | ICD-10-CM

## 2022-07-24 DIAGNOSIS — Z6828 Body mass index (BMI) 28.0-28.9, adult: Secondary | ICD-10-CM

## 2022-07-24 DIAGNOSIS — Z8249 Family history of ischemic heart disease and other diseases of the circulatory system: Secondary | ICD-10-CM

## 2022-07-24 DIAGNOSIS — R5381 Other malaise: Secondary | ICD-10-CM

## 2022-07-24 DIAGNOSIS — R4701 Aphasia: Secondary | ICD-10-CM | POA: Diagnosis present

## 2022-07-24 DIAGNOSIS — E669 Obesity, unspecified: Secondary | ICD-10-CM | POA: Diagnosis present

## 2022-07-24 DIAGNOSIS — R627 Adult failure to thrive: Secondary | ICD-10-CM | POA: Diagnosis present

## 2022-07-24 DIAGNOSIS — I110 Hypertensive heart disease with heart failure: Secondary | ICD-10-CM | POA: Diagnosis present

## 2022-07-24 DIAGNOSIS — G9341 Metabolic encephalopathy: Principal | ICD-10-CM | POA: Diagnosis present

## 2022-07-24 DIAGNOSIS — K5900 Constipation, unspecified: Secondary | ICD-10-CM

## 2022-07-24 DIAGNOSIS — Z79899 Other long term (current) drug therapy: Secondary | ICD-10-CM

## 2022-07-24 DIAGNOSIS — G309 Alzheimer's disease, unspecified: Secondary | ICD-10-CM | POA: Diagnosis present

## 2022-07-24 DIAGNOSIS — Z886 Allergy status to analgesic agent status: Secondary | ICD-10-CM

## 2022-07-24 DIAGNOSIS — F02818 Dementia in other diseases classified elsewhere, unspecified severity, with other behavioral disturbance: Secondary | ICD-10-CM | POA: Diagnosis present

## 2022-07-24 DIAGNOSIS — I10 Essential (primary) hypertension: Secondary | ICD-10-CM | POA: Diagnosis present

## 2022-07-24 DIAGNOSIS — R6510 Systemic inflammatory response syndrome (SIRS) of non-infectious origin without acute organ dysfunction: Secondary | ICD-10-CM

## 2022-07-24 DIAGNOSIS — Z880 Allergy status to penicillin: Secondary | ICD-10-CM

## 2022-07-24 DIAGNOSIS — R1312 Dysphagia, oropharyngeal phase: Secondary | ICD-10-CM | POA: Diagnosis present

## 2022-07-24 DIAGNOSIS — Z66 Do not resuscitate: Secondary | ICD-10-CM | POA: Diagnosis present

## 2022-07-24 DIAGNOSIS — H518 Other specified disorders of binocular movement: Secondary | ICD-10-CM | POA: Diagnosis present

## 2022-07-24 DIAGNOSIS — F03918 Unspecified dementia, unspecified severity, with other behavioral disturbance: Secondary | ICD-10-CM | POA: Diagnosis present

## 2022-07-24 DIAGNOSIS — D696 Thrombocytopenia, unspecified: Secondary | ICD-10-CM | POA: Diagnosis present

## 2022-07-24 DIAGNOSIS — D509 Iron deficiency anemia, unspecified: Secondary | ICD-10-CM | POA: Diagnosis present

## 2022-07-24 DIAGNOSIS — Z8673 Personal history of transient ischemic attack (TIA), and cerebral infarction without residual deficits: Secondary | ICD-10-CM

## 2022-07-24 DIAGNOSIS — R680 Hypothermia, not associated with low environmental temperature: Secondary | ICD-10-CM | POA: Diagnosis present

## 2022-07-24 DIAGNOSIS — Z823 Family history of stroke: Secondary | ICD-10-CM

## 2022-07-24 DIAGNOSIS — E876 Hypokalemia: Secondary | ICD-10-CM | POA: Diagnosis present

## 2022-07-24 DIAGNOSIS — I5032 Chronic diastolic (congestive) heart failure: Secondary | ICD-10-CM | POA: Insufficient documentation

## 2022-07-24 DIAGNOSIS — Z888 Allergy status to other drugs, medicaments and biological substances status: Secondary | ICD-10-CM

## 2022-07-24 DIAGNOSIS — R4182 Altered mental status, unspecified: Secondary | ICD-10-CM

## 2022-07-24 DIAGNOSIS — T68XXXA Hypothermia, initial encounter: Secondary | ICD-10-CM | POA: Diagnosis present

## 2022-07-24 DIAGNOSIS — Z7401 Bed confinement status: Secondary | ICD-10-CM

## 2022-07-24 DIAGNOSIS — I480 Paroxysmal atrial fibrillation: Secondary | ICD-10-CM | POA: Diagnosis present

## 2022-07-24 LAB — COMPREHENSIVE METABOLIC PANEL
ALT: 39 U/L (ref 0–44)
AST: 40 U/L (ref 15–41)
Albumin: 3.1 g/dL — ABNORMAL LOW (ref 3.5–5.0)
Alkaline Phosphatase: 100 U/L (ref 38–126)
Anion gap: 10 (ref 5–15)
BUN: 21 mg/dL (ref 8–23)
CO2: 24 mmol/L (ref 22–32)
Calcium: 9.6 mg/dL (ref 8.9–10.3)
Chloride: 107 mmol/L (ref 98–111)
Creatinine, Ser: 0.71 mg/dL (ref 0.44–1.00)
GFR, Estimated: >60 mL/min (ref >60)
Glucose, Bld: 103 mg/dL — ABNORMAL HIGH (ref 70–99)
Potassium: 3.9 mmol/L (ref 3.5–5.1)
Sodium: 141 mmol/L (ref 135–145)
Total Bilirubin: 0.5 mg/dL (ref 0.3–1.2)
Total Protein: 6.7 g/dL (ref 6.5–8.1)

## 2022-07-24 LAB — I-STAT CHEM 8, ED
BUN: 22 mg/dL (ref 8–23)
Calcium, Ion: 1.2 mmol/L (ref 1.15–1.40)
Chloride: 107 mmol/L (ref 98–111)
Creatinine, Ser: 0.5 mg/dL (ref 0.44–1.00)
Glucose, Bld: 102 mg/dL — ABNORMAL HIGH (ref 70–99)
HCT: 35 % — ABNORMAL LOW (ref 36.0–46.0)
Hemoglobin: 11.9 g/dL — ABNORMAL LOW (ref 12.0–15.0)
Potassium: 3.9 mmol/L (ref 3.5–5.1)
Sodium: 141 mmol/L (ref 135–145)
TCO2: 26 mmol/L (ref 22–32)

## 2022-07-24 LAB — FOLATE
Folate: 24.4 ng/mL (ref >5.9)
Folate: 23.9 ng/mL (ref >5.9)

## 2022-07-24 LAB — DIFFERENTIAL
Abs Immature Granulocytes: 0 10*3/uL (ref 0.00–0.07)
Basophils Absolute: 0.1 10*3/uL (ref 0.0–0.1)
Basophils Relative: 2 %
Eosinophils Absolute: 0 10*3/uL (ref 0.0–0.5)
Eosinophils Relative: 0 %
Lymphocytes Relative: 63 %
Lymphs Abs: 2.4 10*3/uL (ref 0.7–4.0)
Monocytes Absolute: 0.2 10*3/uL (ref 0.1–1.0)
Monocytes Relative: 5 %
Neutro Abs: 1.1 10*3/uL — ABNORMAL LOW (ref 1.7–7.7)
Neutrophils Relative %: 30 %

## 2022-07-24 LAB — URINALYSIS, ROUTINE W REFLEX MICROSCOPIC
Bilirubin Urine: NEGATIVE
Glucose, UA: NEGATIVE mg/dL
Hgb urine dipstick: NEGATIVE
Ketones, ur: NEGATIVE mg/dL
Leukocytes,Ua: NEGATIVE
Nitrite: NEGATIVE
Protein, ur: NEGATIVE mg/dL
Specific Gravity, Urine: 1.031 — ABNORMAL HIGH (ref 1.005–1.030)
pH: 6 (ref 5.0–8.0)

## 2022-07-24 LAB — VITAMIN B12
Vitamin B-12: 1793 pg/mL — ABNORMAL HIGH (ref 180–914)
Vitamin B-12: 1650 pg/mL — ABNORMAL HIGH (ref 180–914)

## 2022-07-24 LAB — CBC
HCT: 32.9 % — ABNORMAL LOW (ref 36.0–46.0)
Hemoglobin: 10.4 g/dL — ABNORMAL LOW (ref 12.0–15.0)
MCH: 24.1 pg — ABNORMAL LOW (ref 26.0–34.0)
MCHC: 31.6 g/dL (ref 30.0–36.0)
MCV: 76.2 fL — ABNORMAL LOW (ref 80.0–100.0)
Platelets: 81 10*3/uL — ABNORMAL LOW (ref 150–400)
RBC: 4.32 MIL/uL (ref 3.87–5.11)
RDW: 17.2 % — ABNORMAL HIGH (ref 11.5–15.5)
WBC: 3.8 10*3/uL — ABNORMAL LOW (ref 4.0–10.5)
nRBC: 0.5 % — ABNORMAL HIGH (ref 0.0–0.2)

## 2022-07-24 LAB — I-STAT VENOUS BLOOD GAS, ED
Acid-Base Excess: 5 mmol/L — ABNORMAL HIGH (ref 0.0–2.0)
Bicarbonate: 29 mmol/L — ABNORMAL HIGH (ref 20.0–28.0)
Calcium, Ion: 1.18 mmol/L (ref 1.15–1.40)
HCT: 33 % — ABNORMAL LOW (ref 36.0–46.0)
Hemoglobin: 11.2 g/dL — ABNORMAL LOW (ref 12.0–15.0)
O2 Saturation: 95 %
Potassium: 4.4 mmol/L (ref 3.5–5.1)
Sodium: 140 mmol/L (ref 135–145)
TCO2: 30 mmol/L (ref 22–32)
pCO2, Ven: 37.7 mmHg — ABNORMAL LOW (ref 44–60)
pH, Ven: 7.494 — ABNORMAL HIGH (ref 7.25–7.43)
pO2, Ven: 70 mmHg — ABNORMAL HIGH (ref 32–45)

## 2022-07-24 LAB — APTT: aPTT: 32 seconds (ref 24–36)

## 2022-07-24 LAB — CBG MONITORING, ED: Glucose-Capillary: 96 mg/dL (ref 70–99)

## 2022-07-24 LAB — PROTIME-INR
INR: 1 (ref 0.8–1.2)
Prothrombin Time: 13.2 seconds (ref 11.4–15.2)

## 2022-07-24 LAB — AMMONIA
Ammonia: 16 umol/L (ref 9–35)
Ammonia: 25 umol/L (ref 9–35)

## 2022-07-24 LAB — LACTIC ACID, PLASMA: Lactic Acid, Venous: 1.7 mmol/L (ref 0.5–1.9)

## 2022-07-24 LAB — TSH: TSH: 3.455 u[IU]/mL (ref 0.350–4.500)

## 2022-07-24 LAB — T4, FREE: Free T4: 0.94 ng/dL (ref 0.61–1.12)

## 2022-07-24 LAB — ETHANOL: Alcohol, Ethyl (B): <10 mg/dL (ref <10)

## 2022-07-24 MED ORDER — LACTATED RINGERS IV SOLN: INTRAVENOUS | Status: AC

## 2022-07-24 MED ORDER — ACETAMINOPHEN 650 MG RE SUPP: 650.0000 mg | Freq: Four times a day (QID) | RECTAL | Status: DC | PRN

## 2022-07-24 MED ORDER — SORBITOL 70 % SOLN
300.0000 mL | TOPICAL_OIL | Freq: Once | ORAL | Status: DC
  Filled 2022-07-24: qty 90

## 2022-07-24 MED ORDER — SODIUM CHLORIDE 0.9% FLUSH
3.0000 mL | Freq: Two times a day (BID) | INTRAVENOUS | Status: DC
  Administered 2022-07-24 – 2022-07-27 (×6): 3 mL via INTRAVENOUS

## 2022-07-24 MED ORDER — ONDANSETRON HCL 4 MG/2ML IJ SOLN: 4.0000 mg | Freq: Four times a day (QID) | INTRAMUSCULAR | Status: DC | PRN

## 2022-07-24 MED ORDER — SODIUM CHLORIDE 0.9% FLUSH
3.0000 mL | Freq: Once | INTRAVENOUS | Status: AC
  Administered 2022-07-24: 3 mL via INTRAVENOUS

## 2022-07-24 MED ORDER — SODIUM CHLORIDE 0.9 % IV SOLN
2.0000 g | Freq: Once | INTRAVENOUS | Status: AC
  Administered 2022-07-24: 2 g via INTRAVENOUS
  Filled 2022-07-24: qty 12.5

## 2022-07-24 MED ORDER — ALBUTEROL SULFATE (2.5 MG/3ML) 0.083% IN NEBU: 2.5000 mg | INHALATION_SOLUTION | Freq: Four times a day (QID) | RESPIRATORY_TRACT | Status: DC | PRN

## 2022-07-24 MED ORDER — IOHEXOL 350 MG/ML SOLN
100.0000 mL | Freq: Once | INTRAVENOUS | Status: AC | PRN
  Administered 2022-07-24: 100 mL via INTRAVENOUS

## 2022-07-24 MED ORDER — VANCOMYCIN HCL 750 MG/150ML IV SOLN
750.0000 mg | INTRAVENOUS | Status: DC
  Administered 2022-07-25: 750 mg via INTRAVENOUS
  Filled 2022-07-24: qty 150

## 2022-07-24 MED ORDER — ENOXAPARIN SODIUM 40 MG/0.4ML IJ SOSY: 40.0000 mg | PREFILLED_SYRINGE | INTRAMUSCULAR | Status: DC

## 2022-07-24 MED ORDER — ACETAMINOPHEN 325 MG PO TABS: 650.0000 mg | ORAL_TABLET | Freq: Four times a day (QID) | ORAL | Status: DC | PRN

## 2022-07-24 MED ORDER — LACTATED RINGERS IV BOLUS
500.0000 mL | Freq: Once | INTRAVENOUS | Status: AC
  Administered 2022-07-24: 500 mL via INTRAVENOUS

## 2022-07-24 MED ORDER — ONDANSETRON HCL 4 MG PO TABS: 4.0000 mg | ORAL_TABLET | Freq: Four times a day (QID) | ORAL | Status: DC | PRN

## 2022-07-24 MED ORDER — SODIUM CHLORIDE 0.9 % IV SOLN: INTRAVENOUS | Status: AC

## 2022-07-24 MED ORDER — METOPROLOL TARTRATE 12.5 MG HALF TABLET
12.5000 mg | ORAL_TABLET | Freq: Two times a day (BID) | ORAL | Status: DC
  Filled 2022-07-24: qty 1

## 2022-07-24 MED ORDER — METOPROLOL TARTRATE 5 MG/5ML IV SOLN
5.0000 mg | Freq: Once | INTRAVENOUS | Status: AC
  Administered 2022-07-24: 5 mg via INTRAVENOUS
  Filled 2022-07-24: qty 5

## 2022-07-24 MED ORDER — FOLIC ACID 5 MG/ML IJ SOLN
1.0000 mg | Freq: Every day | INTRAMUSCULAR | Status: DC
  Administered 2022-07-24: 1 mg via INTRAVENOUS
  Filled 2022-07-24: qty 0.2

## 2022-07-24 MED ORDER — SODIUM CHLORIDE 0.9 % IV SOLN
2.0000 g | Freq: Two times a day (BID) | INTRAVENOUS | Status: DC
  Administered 2022-07-25: 2 g via INTRAVENOUS
  Filled 2022-07-24 (×2): qty 12.5

## 2022-07-24 MED ORDER — LEVETIRACETAM IN NACL 1500 MG/100ML IV SOLN
1500.0000 mg | Freq: Once | INTRAVENOUS | Status: AC
  Administered 2022-07-24: 1500 mg via INTRAVENOUS
  Filled 2022-07-24: qty 100

## 2022-07-24 MED ORDER — VANCOMYCIN HCL IN DEXTROSE 1-5 GM/200ML-% IV SOLN
1000.0000 mg | Freq: Once | INTRAVENOUS | Status: AC
  Administered 2022-07-24: 1000 mg via INTRAVENOUS
  Filled 2022-07-24: qty 200

## 2022-07-24 MED ORDER — DILTIAZEM HCL 25 MG/5ML IV SOLN
5.0000 mg | Freq: Once | INTRAVENOUS | Status: AC
  Administered 2022-07-24: 5 mg via INTRAVENOUS
  Filled 2022-07-24: qty 5

## 2022-07-24 MED ORDER — METRONIDAZOLE 500 MG/100ML IV SOLN
500.0000 mg | Freq: Once | INTRAVENOUS | Status: AC
  Administered 2022-07-24: 500 mg via INTRAVENOUS
  Filled 2022-07-24: qty 100

## 2022-07-24 NOTE — Progress Notes (Signed)
Moved pt from ed 37 to 3w22 from 5:50pm-6:43pm

## 2022-07-24 NOTE — Progress Notes (Signed)
LTM maint complete - no skin breakdown Fp1 Fp2 F7 F8 [facility] monitored, Event button test confirmed by [facility].

## 2022-07-24 NOTE — ED Triage Notes (Signed)
Pt arrived via [facility] from home as a code stroke. Pt's LKW 0500 today. Pt lives with daughter and around 70 today she noticed pt was not responding and had right sided gaze and aphasia. Per EMS, pt's daughter did some CPR since not responding. Per EMS, there was some vomit near pt when she was found. Pt has a right sided gaze, does not follow commands and moans when you move here.

## 2022-07-24 NOTE — H&P (Addendum)
History and Physical    Patient: Tara Dixon WGN:[id number] DOB: Jun 12, 1931 DOA: 07/24/2022 DOS: the patient was seen and examined on 07/24/2022 PCP: Annita Brod, MD  Patient coming from: Home(with daughter and son-in-law)  Chief Complaint:  Chief Complaint  Patient presents with   Code Stroke   HPI: Tara Dixon is a 89 y.o. female with medical history significant of hypertension, diastolic congestive heart failure, Altheimer's dementia who presents after being noted to be acutely altered.  History is obtained from the patient's daughter and son-in-law who are present at bedside as she is currently unable to provide any history.  At baseline she is bedbound and needs assistance with all of her ADLs except for feeding herself.  Daughter states that normally she is able to talk.  She is normally alert and oriented to self and is able to recognize family.  Daughter makes note that she had gotten up around 5 AM and changed her brief and noted that she had a small stool.  She had been dealing with constipation with complaints of abdominal pain.  Daughter makes note plan was to likely give her an enema.  At around 10 AM this morning her son-in-law heard her making strange noises and when he went to go check on her saw that she was moving all her extremities and seemed to be struggling to breathe.  .  Her head is normally turned to the right side, but she was also staring to the right side and not able to speak or respond to them which was unusual.  The only recent change was that she had restarted the patch used to help with her secretions about 6 days ago, but previously had not been using it due to it making the patient sedated.  The patient daughter also makes note that she had recently had some cyst between her vagina and rectum for which they had given her a course of antibiotics, but did not change the area.  It was not reported to be red or inflamed.   In the emergency department patient was  noted to be hypothermic with temperature 93.2 F, respirations 15-31, blood pressures elevated up to 171/94, saturation currently maintained on room air.  CT scan of the head did not note any acute abnormality.  CTA noted no large vessel occlusion.  Patient was not a candidate for thrombolytics due to unclear of onset of symptoms.  Labs significant for WBC-3.8, hemoglobin-10.4, platelets- 84, TSH-3.455, free T4- 0.93.  Chest x-ray noted low lung volumes without focal consolidation.  Venous pH 7.494, pCO2 37.7, and pO2 70. Urinalysis showed no significant signs of infection.  Initially patient has been given Keppra 1500 mg IV.  Orders were placed to check blood cultures and lactic acid.  The provider also placed orders to start patient on empiric antibiotics of vancomycin, metronidazole, and cefepime. Review of Systems: unable to review all systems due to the inability of the patient to answer questions. Past Medical History:  Diagnosis Date   Allergy    Alzheimer disease (HCC)    Anemia    Arthritis    Hypertension    Past Surgical History:  Procedure Laterality Date   ABDOMINAL HYSTERECTOMY     ROTATOR CUFF REPAIR Right    Social History:  reports that she has never smoked. She has never used smokeless tobacco. No history on file for alcohol use and drug use.  Allergies  Allergen Reactions   Ampicillin Other (See Comments)    unknown  Bactrim [Sulfamethoxazole-Trimethoprim] Other (See Comments)    unknown   Celebrex [Celecoxib] Other (See Comments)    unknown   Donepezil     Other reaction(s): intolerant   Penicillins Other (See Comments)    unknown   Polysporin [Bacitracin-Polymyxin B] Other (See Comments)    unknown    Family History  Problem Relation Age of Onset   Hypertension Mother    Stroke Mother    Hypertension Father    Stroke Father    Hypertension Sister     Prior to Admission medications   Medication Sig Start Date End Date Taking? Authorizing Provider   acetaminophen (TYLENOL) 160 MG/5ML liquid Take 320 mg by mouth every 4 (four) hours as needed for fever.    [provider]  diclofenac Sodium (VOLTAREN) 1 % GEL Apply 2 g topically daily as needed (pain).    [provider]  haloperidol (HALDOL) 0.5 MG tablet Take 0.5 mg by mouth daily.    [provider]  hydrochlorothiazide (MICROZIDE) 12.5 MG capsule Take 12.5 mg by mouth daily.    [provider]  QUEtiapine (SEROQUEL) 25 MG tablet Take 12.5 mg by mouth at bedtime.    [provider]  senna (SENOKOT) 8.6 MG tablet Take 1 tablet by mouth daily as needed for constipation.    [provider]  sennosides-docusate sodium (SENOKOT-S) 8.6-50 MG tablet Take 1 tablet by mouth daily as needed for constipation.    [provider]    Physical Exam: Vitals:   07/24/22 1330 07/24/22 1400 07/24/22 1445 07/24/22 1446  BP: 131/81 126/88 (!) 138/96   Pulse: 79 91 94   Resp: (!) 21 (!) 23 (!) 31   Temp:    (!) 93.7 F (34.3 C)  TempSrc:    Rectal  SpO2: 99% 99% 96%   Weight:       Constitutional: Elderly female who is awake but not really following commands. Eyes: PERRL, right gaze preference and eyes did not cross midline. ENMT: Mucous membranes are dry.  Fair dentition Neck: normal, supple, no JVD appreciated. Respiratory: clear to auscultation bilaterally, no wheezing, no crackles. Normal respiratory effort. No accessory muscle use.  Cardiovascular: Regular rate and rhythm, no murmurs / rubs / gallops. No extremity edema.   Abdomen: no tenderness, no masses palpated.  Bowel sounds positive.  Musculoskeletal: no clubbing / cyanosis. No joint deformity upper and lower extremities. Normal muscle tone.  Skin: no rashes, lesions, ulcers.   Neurologic: Appears able to move all extremities to noxious stimuli. Psychiatric: Unable to assess orientation.  Data Reviewed:  EKG reveals sinus rhythm at 82 beats with first-degree heart block.   Per reviewed labs, imaging, and pertinent records as noted above in the HPI.  Assessment and Plan:  Acute encephalopathy Seizure-like activity Patient presents after being noted to be acutely altered this morning for which she was noted to be aphasic with right gaze preference and concern for possible seizure-like activity.  CT scan of the head and CTA of the head and neck did not note any acute abnormality.  Neurology had been formally consulted.  Patient has been loaded with Keppra IV due to concern for possibility of seizure..  TSH and free T4 were noted to be within normal limits.  Unclear cause of patient's symptoms at this time.  Differential includes seizure versus CVA versus possibility of infection.  Of note daughter had recently received started a patch for some secretions that seems like it may have been scopolamine proximately  6 days ago. -Admit to a telemetry bed -Neurochecks -N.p.o. -Check ammonia, B12, folic acid -Continuous EEG monitoring -Check MRI of the brain -Normal saline IV fluids at 75 mL/h -Appreciate neurology consultative services we will follow-up for any further recommendations  SIRS Hypothermia Acute.  On admission patient's temperature was noted to be 93.2 F and she was tachypneic with WBC 3.8.  Meeting SIRS criteria.  Chest x-ray showed low lung volumes without acute abnormality.  Urinalysis did not show any significant signs of infection.  Patient has been started on empiric antibiotics of vancomycin, cefepime, and metronidazole. -Follow-up blood cultures -Continue Bair hugger -Continue empiric antibiotics of vancomycin and cefepime.  De-escalate antibiotics when medically appropriate  Possible new onset atrial fibrillation Patient reported to be in atrial fibrillation by nursing staff with heart rates 120-140s. -Check EKG -Diltiazem 5 mg IV x 1 dose.  Reevaluate to see if patient needs to be placed on a drip -Check echocardiogram  Constipation Prior to  arrival.  Patient's daughter makes note that she has been dealing with constipation and she had given her stool softeners recently which patient had a small bowel movement this morning. -Check abdominal x-ray -Consider enema if patient unable to have bowel movement.  Pancytopenia Acute on chronic.  On admission WBC 3.8, hemoglobin 10.4 ->11.9->11.2, and platelet count 81. -Recheck CBC tomorrow morning  History of CVA Patient with prior right medial cerebral infarct seen on CT from 06/09/2021.  Dementia Home medications include Haldol and Seroquel. -Resume once able  Debility Patient bedbound and needs assistant with all ADLs except for eating as reported by her daughter.   DVT prophylaxis: SCDs due to thrombocytopenia Advance Care Planning:   Code Status: DNR    Consults: Neurology  Family Communication: Patient's daughter and son-in-law updated at bedside  Severity of Illness: The appropriate patient status for this patient is INPATIENT. Inpatient status is judged to be reasonable and necessary in order to provide the required intensity of service to ensure the patient's safety. The patient's presenting symptoms, physical exam findings, and initial radiographic and laboratory data in the context of their chronic comorbidities is felt to place them at high risk for further clinical deterioration. Furthermore, it is not anticipated that the patient will be medically stable for discharge from the hospital within 2 midnights of admission.   * I certify that at the point of admission it is my clinical judgment that the patient will require inpatient hospital care spanning beyond 2 midnights from the point of admission due to high intensity of service, high risk for further deterioration and high frequency of surveillance required.*  Author: Clydie Braun, MD 07/24/2022 3:25 PM  For on call review [email or web address].

## 2022-07-24 NOTE — ED Notes (Signed)
ED TO INPATIENT HANDOFF REPORT  ED Nurse Name and Phone #: [phone]  S Name/Age/Gender Tara Dixon 89 y.o. female Room/Bed: 037C/037C  Code Status   Code Status: DNR  Home/SNF/Other Home Patient oriented to: self Is this baseline? No   Triage Complete: Triage complete  Chief Complaint Acute metabolic encephalopathy [G93.41]  Triage Note Pt arrived via [facility] from home as a code stroke. Pt's LKW 0500 today. Pt lives with daughter and around 44 today she noticed pt was not responding and had right sided gaze and aphasia. Per EMS, pt's daughter did some CPR since not responding. Per EMS, there was some vomit near pt when she was found. Pt has a right sided gaze, does not follow commands and moans when you move here.    Allergies Allergies  Allergen Reactions   Ampicillin Other (See Comments)    unknown   Bactrim [Sulfamethoxazole-Trimethoprim] Other (See Comments)    unknown   Celebrex [Celecoxib] Other (See Comments)    unknown   Donepezil     Other reaction(s): intolerant   Penicillins Other (See Comments)    unknown   Polysporin [Bacitracin-Polymyxin B] Other (See Comments)    unknown    Level of Care/Admitting Diagnosis ED Disposition     ED Disposition  Admit   Condition  --   Comment  Hospital Area: [facility] [[id number]]  Level of Care: Telemetry Medical [104]  May place patient in observation at [facility] or [facility] if equivalent level of care is available:: No  Covid Evaluation: Asymptomatic - no recent exposure (last 10 days) testing not required  Diagnosis: Acute metabolic encephalopathy [[id number]]  Admitting Physician: Clydie Braun [[id number]]  Attending Physician: Clydie Braun [[id number]]          B Medical/Surgery History Past Medical History:  Diagnosis Date   Allergy    Alzheimer disease (HCC)    Anemia    Arthritis    Hypertension    Past Surgical History:  Procedure Laterality Date   ABDOMINAL  HYSTERECTOMY     ROTATOR CUFF REPAIR Right      A IV Location/Drains/Wounds Patient Lines/Drains/Airways Status     Active Line/Drains/Airways     Name Placement date Placement time Site Days   Peripheral IV 07/24/22 18 G Posterior;Right Forearm 07/24/22  --  Forearm  less than 1   Peripheral IV 07/24/22 18 G Left;Posterior Forearm 07/24/22  --  Forearm  less than 1            Intake/Output Last 24 hours  Intake/Output Summary (Last 24 hours) at 07/24/2022 1700 Last data filed at 07/24/2022 1305 Gross per 24 hour  Intake 100 ml  Output --  Net 100 ml    Labs/Imaging Results for orders placed or performed during the hospital encounter of 07/24/22 (from the past 48 hour(s))  CBG monitoring, ED     Status: None   Collection Time: 07/24/22 11:40 AM  Result Value Ref Range   Glucose-Capillary 96 70 - 99 mg/dL    Comment: Glucose reference range applies only to samples taken after fasting for at least 8 hours.  Protime-INR     Status: None   Collection Time: 07/24/22 11:41 AM  Result Value Ref Range   Prothrombin Time 13.2 11.4 - 15.2 seconds   INR 1.0 0.8 - 1.2    Comment: (NOTE) INR goal varies based on device and disease states. Performed at [facility], [address], [place], [place]  [address]   APTT     Status: None   Collection Time: 07/24/22 11:41 AM  Result Value Ref Range   aPTT 32 24 - 36 seconds    Comment: Performed at [facility], [address] [place], [place] [address]  CBC     Status: Abnormal   Collection Time: 07/24/22 11:41 AM  Result Value Ref Range   WBC 3.8 (L) 4.0 - 10.5 K/uL   RBC 4.32 3.87 - 5.11 MIL/uL   Hemoglobin 10.4 (L) 12.0 - 15.0 g/dL   HCT 09.8 (L) 11.9 - 14.7 %   MCV 76.2 (L) 80.0 - 100.0 fL   MCH 24.1 (L) 26.0 - 34.0 pg   MCHC 31.6 30.0 - 36.0 g/dL   RDW 82.9 (H) 56.2 - 13.0 %   Platelets 81 (L) 150 - 400 K/uL    Comment: SPECIMEN CHECKED FOR CLOTS Immature Platelet Fraction may be clinically indicated,  consider ordering this additional test [id number] REPEATED TO VERIFY    nRBC 0.5 (H) 0.0 - 0.2 %    Comment: Performed at [facility], [address], [place], [place] [address]  Differential     Status: Abnormal   Collection Time: 07/24/22 11:41 AM  Result Value Ref Range   Neutrophils Relative % 30 %   Neutro Abs 1.1 (L) 1.7 - 7.7 K/uL   Lymphocytes Relative 63 %   Lymphs Abs 2.4 0.7 - 4.0 K/uL   Monocytes Relative 5 %   Monocytes Absolute 0.2 0.1 - 1.0 K/uL   Eosinophils Relative 0 %   Eosinophils Absolute 0.0 0.0 - 0.5 K/uL   Basophils Relative 2 %   Basophils Absolute 0.1 0.0 - 0.1 K/uL   WBC Morphology MORPHOLOGY UNREMARKABLE    RBC Morphology See Note    Smear Review MORPHOLOGY UNREMARKABLE    Abs Immature Granulocytes 0.00 0.00 - 0.07 K/uL   Ovalocytes PRESENT     Comment: Performed at [facility], [address] [place], [place] [address]  Comprehensive metabolic panel     Status: Abnormal   Collection Time: 07/24/22 11:41 AM  Result Value Ref Range   Sodium 141 135 - 145 mmol/L   Potassium 3.9 3.5 - 5.1 mmol/L   Chloride 107 98 - 111 mmol/L   CO2 24 22 - 32 mmol/L   Glucose, Bld 103 (H) 70 - 99 mg/dL    Comment: Glucose reference range applies only to samples taken after fasting for at least 8 hours.   BUN 21 8 - 23 mg/dL   Creatinine, Ser 4.40 0.44 - 1.00 mg/dL   Calcium 9.6 8.9 - 10.2 mg/dL   Total Protein 6.7 6.5 - 8.1 g/dL   Albumin 3.1 (L) 3.5 - 5.0 g/dL   AST 40 15 - 41 U/L   ALT 39 0 - 44 U/L   Alkaline Phosphatase 100 38 - 126 U/L   Total Bilirubin 0.5 0.3 - 1.2 mg/dL   GFR, Estimated >72 >53 mL/min    Comment: (NOTE) Calculated using the CKD-EPI Creatinine Equation (2021)    Anion gap 10 5 - 15    Comment: Performed at [facility], [address], [place], [place] [address]  Ethanol     Status: None   Collection Time: 07/24/22 11:41 AM  Result Value Ref Range   Alcohol, Ethyl (B) <10 <10 mg/dL    Comment: (NOTE) Lowest  detectable limit for serum alcohol is 10 mg/dL.  For medical purposes  only. Performed at [facility], [address] [place], [place] [address]   I-stat chem 8, ED     Status: Abnormal   Collection Time: 07/24/22 11:44 AM  Result Value Ref Range   Sodium 141 135 - 145 mmol/L   Potassium 3.9 3.5 - 5.1 mmol/L   Chloride 107 98 - 111 mmol/L   BUN 22 8 - 23 mg/dL   Creatinine, Ser 0.27 0.44 - 1.00 mg/dL   Glucose, Bld 253 (H) 70 - 99 mg/dL    Comment: Glucose reference range applies only to samples taken after fasting for at least 8 hours.   Calcium, Ion 1.20 1.15 - 1.40 mmol/L   TCO2 26 22 - 32 mmol/L   Hemoglobin 11.9 (L) 12.0 - 15.0 g/dL   HCT 66.4 (L) 40.3 - 47.4 %  Urinalysis, Routine w reflex microscopic -Urine, Clean Catch     Status: Abnormal   Collection Time: 07/24/22  1:12 PM  Result Value Ref Range   Color, Urine YELLOW YELLOW   APPearance CLEAR CLEAR   Specific Gravity, Urine 1.031 (H) 1.005 - 1.030   pH 6.0 5.0 - 8.0   Glucose, UA NEGATIVE NEGATIVE mg/dL   Hgb urine dipstick NEGATIVE NEGATIVE   Bilirubin Urine NEGATIVE NEGATIVE   Ketones, ur NEGATIVE NEGATIVE mg/dL   Protein, ur NEGATIVE NEGATIVE mg/dL   Nitrite NEGATIVE NEGATIVE   Leukocytes,Ua NEGATIVE NEGATIVE    Comment: Performed at [facility], [address], [place], [place] [address]  Ammonia     Status: None   Collection Time: 07/24/22  2:50 PM  Result Value Ref Range   Ammonia 16 9 - 35 umol/L    Comment: Performed at [facility], [address], [place], [place] [address]  TSH     Status: None   Collection Time: 07/24/22  2:50 PM  Result Value Ref Range   TSH 3.455 0.350 - 4.500 uIU/mL    Comment: Performed by a 3rd Generation assay with a functional sensitivity of <=0.01 uIU/mL. Performed at [facility], [address] [place], [place] [address]   T4, free     Status: None   Collection Time: 07/24/22  2:50 PM  Result Value Ref Range   Free T4 0.94 0.61 - 1.12 ng/dL     Comment: (NOTE) Biotin ingestion may interfere with free T4 tests. If the results are inconsistent with the TSH level, previous test results, or the clinical presentation, then consider biotin interference. If needed, order repeat testing after stopping biotin. Performed at [facility], [address] [place], [place] [address]   Vitamin B12     Status: Abnormal   Collection Time: 07/24/22  2:50 PM  Result Value Ref Range   Vitamin B-12 1,793 (H) 180 - 914 pg/mL    Comment: (NOTE) This assay is not validated for testing neonatal or myeloproliferative syndrome specimens for Vitamin B12 levels. Performed at [facility], [address] [place], [place] [address]   Folate     Status: Abnormal   Collection Time: 07/24/22  2:50 PM  Result Value Ref Range   Folate 5.9 (L) >5.9 ng/mL    Comment: RESULT CONFIRMED BY MANUAL DILUTION Performed at [facility], [address] [place], [place] [address]   I-Stat venous blood gas, [facility] ED, [facility], [facility])     Status: Abnormal   Collection Time: 07/24/22  2:51 PM  Result Value Ref Range  pH, Ven 7.494 (H) 7.25 - 7.43   pCO2, Ven 37.7 (L) 44 - 60 mmHg   pO2, Ven 70 (H) 32 - 45 mmHg   Bicarbonate 29.0 (H) 20.0 - 28.0 mmol/L   TCO2 30 22 - 32 mmol/L   O2 Saturation 95 %   Acid-Base Excess 5.0 (H) 0.0 - 2.0 mmol/L   Sodium 140 135 - 145 mmol/L   Potassium 4.4 3.5 - 5.1 mmol/L   Calcium, Ion 1.18 1.15 - 1.40 mmol/L   HCT 33.0 (L) 36.0 - 46.0 %   Hemoglobin 11.2 (L) 12.0 - 15.0 g/dL   Sample type VENOUS    DG Chest Portable 1 View  Result Date: 07/24/2022 CLINICAL DATA:  Altered mental status EXAM: PORTABLE CHEST 1 VIEW COMPARISON:  Chest radiograph dated 06/09/2021 FINDINGS: Low lung volumes. No focal consolidations. No pleural effusion or pneumothorax. Similar enlarged cardiomediastinal silhouette. No acute osseous abnormality. IMPRESSION: Low lung volumes without focal consolidation. Electronically Signed   By: Agustin Cree  M.D.   On: 07/24/2022 14:50   CT ANGIO HEAD NECK W WO CM W PERF (CODE STROKE)  Result Date: 07/24/2022 CLINICAL DATA:  Neuro deficit, acute, stroke suspected EXAM: EXAM CT ANGIOGRAPHY HEAD AND NECK WITH AND WITHOUT CONTRAST TECHNIQUE: TECHNIQUE Multidetector CT imaging of the head and neck was performed using the standard protocol during bolus administration of intravenous contrast. Multiplanar CT image reconstructions and MIPs were obtained to evaluate the vascular anatomy. Carotid stenosis measurements (when applicable) are obtained utilizing NASCET criteria, using the distal internal carotid diameter as the denominator. RADIATION DOSE REDUCTION: This exam was performed according to the departmental dose-optimization program which includes automated exposure control, adjustment of the mA and/or kV according to patient size and/or use of iterative reconstruction technique. COMPARISON:  CT head from the same day. FINDINGS: CTA NECK: Aorta: Great vessel origins are patent without significant stenosis. Right carotid system: Patent without significant (greater than 50%) stenosis. Left carotid system: Patent without significant (greater than 50%) stenosis. Vertebral arteries: Right dominant. Both vertebral arteries are patent in the neck without greater than 50% stenosis. Neck: No acute abnormality on limited assessment. Osseous: No acute abnormality on limited assessment. Severe multilevel degenerative change including bulky osteophytes and multilevel ankylosis. CTA HEAD: Anterior circulation: Bilateral intracranial ICAs, MCAs, and ACAs are patent without proximal hemodynamically significant stenosis. Posterior circulation: Severe stenosis versus occlusion of the small/non dominant intradural vertebral artery at its dural margin. Reconstitution distally of a small intradural vertebral artery. Right intradural vertebral artery is patent. Severe stenosis of the mid basilar artery. Prominent bilateral posterior  communicating arteries with small distal vertebrobasilar system, anatomic variant. Both posterior cerebral arteries are patent without proximal hemodynamically significant stenosis. IMPRESSION: 1. No emergent large vessel occlusion. 2. Severe stenosis versus occlusion of a diminutive nondominant left intradural vertebral artery is dural margin with reconstitution distally. 3. Severe stenosis of the basilar artery. Electronically Signed   By: Feliberto Harts M.D.   On: 07/24/2022 12:30   CT HEAD CODE STROKE WO CONTRAST  Result Date: 07/24/2022 CLINICAL DATA:  Code stroke. Neuro deficit, acute, stroke suspected no lateralizing signs provided. EXAM: CT HEAD WITHOUT CONTRAST TECHNIQUE: Contiguous axial images were obtained from the base of the skull through the vertex without intravenous contrast. RADIATION DOSE REDUCTION: This exam was performed according to the departmental dose-optimization program which includes automated exposure control, adjustment of the mA and/or kV according to patient size and/or use of iterative reconstruction technique. COMPARISON:  MR Brain 06/09/21 FINDINGS: Brain:  No evidence of acute infarction, hemorrhage, hydrocephalus, extra-axial collection or mass lesion/mass effect. Sequela of severe chronic microvascular ischemic change. Generalized volume loss. Vascular: No hyperdense vessel or unexpected calcification. Skull: Mild soft tissue thickening along the right parietal scalp Sinuses/Orbits: No middle ear or mastoid effusion. Paranasal sinuses are clear. Orbits are unremarkable. Other: None. ASPECTS [facility] Stroke Program Early CT Score): 10 when accounting for chronic findings IMPRESSION: No hemorrhage or CT evidence of an acute cortical infarcts. Aspects is 10 when accounting for chronic findings. Findings were paged to Dr. Derry Lory on 07/24/2022 at 11:55 a.m. via [facility] paging system. Electronically Signed   By: Lorenza Cambridge M.D.   On: 07/24/2022 11:56    Pending  Labs Unresulted Labs (From admission, onward)     Start     Ordered   07/25/22 0500  CBC  Tomorrow morning,   R        07/24/22 1659   07/25/22 0500  Basic metabolic panel  Tomorrow morning,   R        07/24/22 1659   07/24/22 1659  Ammonia  Once,   R        07/24/22 1659   07/24/22 1659  Vitamin B12  Once,   R        07/24/22 1659   07/24/22 1659  Folate  Once,   R        07/24/22 1659   07/24/22 1534  Blood culture (routine x 2)  BLOOD CULTURE X 2,   R      07/24/22 1534   07/24/22 1534  Lactic acid, plasma  Now then every 2 hours,   R      07/24/22 1534   07/24/22 1433  T3, free  Once,   URGENT        07/24/22 1433            Vitals/Pain Today's Vitals   07/24/22 1330 07/24/22 1400 07/24/22 1445 07/24/22 1446  BP: 131/81 126/88 (!) 138/96   Pulse: 79 91 94   Resp: (!) 21 (!) 23 (!) 31   Temp:    (!) 93.7 F (34.3 C)  TempSrc:    Rectal  SpO2: 99% 99% 96%   Weight:      Height:    5' 1.5" (1.562 m)    Isolation Precautions No active isolations  Medications Medications  lactated ringers infusion (has no administration in time range)  ceFEPIme (MAXIPIME) 2 g in sodium chloride 0.9 % 100 mL IVPB (has no administration in time range)  metroNIDAZOLE (FLAGYL) IVPB 500 mg (has no administration in time range)  vancomycin (VANCOCIN) IVPB 1000 mg/200 mL premix (has no administration in time range)  ceFEPIme (MAXIPIME) 2 g in sodium chloride 0.9 % 100 mL IVPB (has no administration in time range)  vancomycin (VANCOREADY) IVPB 750 mg/150 mL (has no administration in time range)  sodium chloride flush (NS) 0.9 % injection 3 mL (has no administration in time range)  acetaminophen (TYLENOL) tablet 650 mg (has no administration in time range)    Or  acetaminophen (TYLENOL) suppository 650 mg (has no administration in time range)  ondansetron (ZOFRAN) tablet 4 mg (has no administration in time range)    Or  ondansetron (ZOFRAN) injection 4 mg (has no administration in time  range)  albuterol (PROVENTIL) (2.5 MG/3ML) 0.083% nebulizer solution 2.5 mg (has no administration in time range)  sodium chloride flush (NS) 0.9 % injection 3 mL (3 mLs Intravenous Given 07/24/22 1417)  iohexol (OMNIPAQUE) 350 MG/ML injection 100 mL (100 mLs Intravenous Contrast Given 07/24/22 1218)  levETIRAcetam (KEPPRA) IVPB 1500 mg/ 100 mL premix (0 mg Intravenous Stopped 07/24/22 1305)    Mobility non-ambulatory     Focused Assessments Neuro Assessment Handoff:  Swallow screen pass? No  Cardiac Rhythm: Normal sinus rhythm NIH Stroke Scale  Dizziness Present: No Headache Present: No Interval: Initial Level of Consciousness (1a.)   : Alert, keenly responsive LOC Questions (1b. )   : Answers neither question correctly LOC Commands (1c. )   : Performs neither task correctly Best Gaze (2. )  : Forced deviation Visual (3. )  : Bilateral hemianopia (blind including cortical blindness) Facial Palsy (4. )    : Minor paralysis Motor Arm, Left (5a. )   : No effort against gravity Motor Arm, Right (5b. ) : No effort against gravity Motor Leg, Left (6a. )  : No effort against gravity Motor Leg, Right (6b. ) : No effort against gravity Limb Ataxia (7. ): Absent Sensory (8. )  : Normal, no sensory loss Best Language (9. )  : Severe aphasia Dysarthria (10. ): Severe dysarthria, patient's speech is so slurred as to be unintelligible in the absence of or out of proportion to any dysphasia, or is mute/anarthric Extinction/Inattention (11.)   : Profound hemi-inattention or extinction to more than one modality Complete NIHSS TOTAL: 28 Last date known well: 07/24/22 Last time known well: 0500 Neuro Assessment:   Neuro Checks:   Initial (07/24/22 1200)  Has TPA been given? No If patient is a Neuro Trauma and patient is going to OR before floor call report to 4N Charge nurse: [phone] or [phone]   R Recommendations: See Admitting Provider Note  Report given to:   Additional Notes:  Patient whenever you call her name she does not respond. She is on the EEG and on the Bare hugger temp just now rectally 96.3

## 2022-07-24 NOTE — Progress Notes (Signed)
LTM EEG hooked up and running - no initial skin breakdown - push button tested - [facility] monitoring.MRI Safe leads

## 2022-07-24 NOTE — Code Documentation (Signed)
Stroke Response Nurse Documentation Code Documentation  Tara Dixon is a 89 y.o. female arriving to [facility]  via [facility] on 07/24/2022 with past medical hx of metabolic encephalopthy, failure to thrive. Code stroke was activated by EMS.   Patient from home where she was LKW at 0500 and now complaining of right gaze and trouble speaking. Pt at baseline can have a conversation. Daughter reports she was at her baseline at 2200 last night when she went to bed. This morning, she woke up at 0500 and was noted to be acting herself per daughter. Per EMS, The daughter then found her gazing to the right not responding appropriately. Daughter pulled patient to the ground and did complete some compressions and noted "bubbilng at the mouth." EMS was called.   Stroke team at the bedside on patient arrival. Labs drawn and patient cleared for CT by Dr. Wallace Cullens. Patient to CT with team. NIHSS 27, see documentation for details and code stroke times. Patient with disoriented, not following commands, right gaze preference , left hemianopia, left facial droop, bilateral arm weakness, bilateral leg weakness, Global aphasia , dysarthria , and left neglect on exam. The following imaging was completed:  CT Head and CTA. Patient is not a candidate for IV Thrombolytic due to being outside window. Patient is not a candidate for IR due to poor mRS per MD khaliqdina.   Care Plan: q2 NIHSS, BP < 210. Plan for MRI and EEG.   Bedside handoff with ED RN Tara Dixon.    Tara Dixon  Stroke Response RN

## 2022-07-24 NOTE — Progress Notes (Signed)
Pharmacy Antibiotic Note  Tara Dixon is a 89 y.o. female admitted on 07/24/2022 with sepsis.  Pharmacy has been consulted for cefepime and vancomycin dosing.  Plan: Cefepime 2g Q12H.  Give IV Vancomycin 1000 x 1 for loading dose, followed by IV Vancomycin 750 every 24 hours for eAUC434.  Vd of 0.72, Scr rounded to 0.8.  Flagyl x 1 per EDP.  Follow culture data for de-escalation.  Monitor renal function for dose adjustments as indicated.    Height: 5' 1.5" (156.2 cm) Weight: 68.8 kg (151 lb 10.8 oz) IBW/kg (Calculated) : 48.94  Temp (24hrs), Avg:93.5 F (34.2 C), Min:93.2 F (34 C), Max:93.7 F (34.3 C)  Recent Labs  Lab 07/24/22 1141 07/24/22 1144  WBC 3.8*  --   CREATININE 0.71 0.50    Estimated Creatinine Clearance: 43.7 mL/min (by C-G formula based on SCr of 0.5 mg/dL).    Allergies  Allergen Reactions   Ampicillin Other (See Comments)    unknown   Bactrim [Sulfamethoxazole-Trimethoprim] Other (See Comments)    unknown   Celebrex [Celecoxib] Other (See Comments)    unknown   Donepezil     Other reaction(s): intolerant   Penicillins Other (See Comments)    unknown   Polysporin [Bacitracin-Polymyxin B] Other (See Comments)    unknown    Thank you for allowing pharmacy to be a part of this patient's care.  Estill Batten, PharmD, BCCCP  07/24/2022 3:42 PM

## 2022-07-24 NOTE — ED Notes (Signed)
Pt was incontinent of urine, cleaned pt and applied a clean brief. ?

## 2022-07-24 NOTE — ED Notes (Signed)
Lab called at 1740 Folate was called 5.9 and correction was made 24.4. Dr Katrinka Blazing was made aware of the change at [facility]

## 2022-07-24 NOTE — ED Provider Notes (Signed)
[facility] DEPARTMENT AT [facility] Provider Note  CSN: [id number] Arrival date & time: 07/24/22 1138  Chief Complaint(s) Code Stroke  HPI Tara Dixon is a 89 y.o. female with past medical history as below, significant for Alzheimer's dementia, hypertension, thrombocytopenia, physical deconditioning, failure to thrive who presents to the ED with complaint of AMS/stroke alert.  Patient unable to contribute history.  Per EMS/family patient went to bed around 10:00 last night around 5 AM this morning she was acting at her baseline.  The daughter tried to wake her up later in the morning and she was not as responsive as her baseline, report was some "bubbling" to her mouth and would not blink her eyes or track appropriately.  Questionable difficulty breathing.  She felt patient was looking to the right, EMS was dispatched.  On arrival she has right-sided gaze deviation, does not follow commands or answer questions appropriately.  Level 5 caveat AMS.  Airway cleared on arrival and sent to CT for urgent evaluation   Past Medical History Past Medical History:  Diagnosis Date   Allergy    Alzheimer disease (HCC)    Anemia    Arthritis    Hypertension    Patient Active Problem List   Diagnosis Date Noted   UTI due to Klebsiella species 06/12/2021   Thrombocytopenia (HCC) 06/11/2021   Sepsis secondary to UTI (HCC) 06/10/2021   Dementia with behavioral disturbance (HCC) 06/10/2021   HTN (hypertension) 06/10/2021   Diarrhea 06/10/2021   Physical deconditioning 06/10/2021   Failure to thrive in adult 06/10/2021   Acute metabolic encephalopathy 06/09/2021   Home Medication(s) Prior to Admission medications   Medication Sig Start Date End Date Taking? Authorizing Provider  acetaminophen (TYLENOL) 160 MG/5ML liquid Take 320 mg by mouth every 4 (four) hours as needed for fever.    [provider]  diclofenac Sodium (VOLTAREN) 1 % GEL Apply 2 g topically daily as  needed (pain).    [provider]  haloperidol (HALDOL) 0.5 MG tablet Take 0.5 mg by mouth daily.    [provider]  hydrochlorothiazide (MICROZIDE) 12.5 MG capsule Take 12.5 mg by mouth daily.    [provider]  QUEtiapine (SEROQUEL) 25 MG tablet Take 12.5 mg by mouth at bedtime.    [provider]  senna (SENOKOT) 8.6 MG tablet Take 1 tablet by mouth daily as needed for constipation.    [provider]  sennosides-docusate sodium (SENOKOT-S) 8.6-50 MG tablet Take 1 tablet by mouth daily as needed for constipation.    [provider]                                                                                                                                    Past Surgical History Past Surgical History:  Procedure Laterality Date   ABDOMINAL HYSTERECTOMY     ROTATOR CUFF REPAIR Right    Family History Family  History  Problem Relation Age of Onset   Hypertension Mother    Stroke Mother    Hypertension Father    Stroke Father    Hypertension Sister     Social History Social History   Tobacco Use   Smoking status: Never   Smokeless tobacco: Never   Allergies Ampicillin, Bactrim [sulfamethoxazole-trimethoprim], Celebrex [celecoxib], Donepezil, Penicillins, and Polysporin [bacitracin-polymyxin b]  Review of Systems Review of Systems  Unable to perform ROS: Mental status change    Physical Exam Vital Signs  I have reviewed the triage vital signs BP (!) 138/96   Pulse 94   Temp (!) 93.7 F (34.3 C) (Rectal)   Resp (!) 31   Ht 5' 1.5" (1.562 m)   Wt 68.8 kg   SpO2 96%   BMI 28.20 kg/m  Physical Exam Constitutional:      General: She is in acute distress.     Appearance: She is obese. She is ill-appearing.  HENT:     Head: Normocephalic and atraumatic.     Right Ear: External ear normal.     Left Ear: External ear normal.     Nose: Nose normal.     Mouth/Throat:     Mouth: Mucous membranes are dry.  Eyes:      General: No scleral icterus.       Right eye: No discharge.        Left eye: No discharge.     Pupils: Pupils are equal, round, and reactive to light.  Cardiovascular:     Rate and Rhythm: Normal rate and regular rhythm.     Pulses: Normal pulses.     Heart sounds: Normal heart sounds.  Pulmonary:     Effort: Pulmonary effort is normal.     Breath sounds: Normal breath sounds.  Abdominal:     General: Abdomen is flat. There is no distension.     Palpations: Abdomen is soft.     Tenderness: There is no abdominal tenderness.  Musculoskeletal:        General: No deformity.     Cervical back: No rigidity.     Right lower leg: No edema.     Left lower leg: No edema.  Skin:    General: Skin is warm.     Capillary Refill: Capillary refill takes 2 to 3 seconds.  Neurological:     Mental Status: She is lethargic.     GCS: GCS eye subscore is 4. GCS verbal subscore is 2. GCS motor subscore is 4.     Cranial Nerves: No facial asymmetry.     Comments: Right gaze deviation  Variable compliance with neurotesting.  She does withdraw to noxious stimulus Incomprehensible verbal response     ED Results and Treatments Labs (all labs ordered are listed, but only abnormal results are displayed) Labs Reviewed  CBC - Abnormal; Notable for the following components:      Result Value   WBC 3.8 (*)    Hemoglobin 10.4 (*)    HCT 32.9 (*)    MCV 76.2 (*)    MCH 24.1 (*)    RDW 17.2 (*)    Platelets 81 (*)    nRBC 0.5 (*)    All other components within normal limits  DIFFERENTIAL - Abnormal; Notable for the following components:   Neutro Abs 1.1 (*)    All other components within normal limits  COMPREHENSIVE METABOLIC PANEL - Abnormal; Notable for the following components:   Glucose, Bld 103 (*)  Albumin 3.1 (*)    All other components within normal limits  URINALYSIS, ROUTINE W REFLEX MICROSCOPIC - Abnormal; Notable for the following components:   Specific Gravity, Urine 1.031 (*)     All other components within normal limits  VITAMIN B12 - Abnormal; Notable for the following components:   Vitamin B-12 1,793 (*)    All other components within normal limits  FOLATE - Abnormal; Notable for the following components:   Folate 5.9 (*)    All other components within normal limits  I-STAT CHEM 8, ED - Abnormal; Notable for the following components:   Glucose, Bld 102 (*)    Hemoglobin 11.9 (*)    HCT 35.0 (*)    All other components within normal limits  I-STAT VENOUS BLOOD GAS, ED - Abnormal; Notable for the following components:   pH, Ven 7.494 (*)    pCO2, Ven 37.7 (*)    pO2, Ven 70 (*)    Bicarbonate 29.0 (*)    Acid-Base Excess 5.0 (*)    HCT 33.0 (*)    Hemoglobin 11.2 (*)    All other components within normal limits  CULTURE, BLOOD (ROUTINE X 2)  CULTURE, BLOOD (ROUTINE X 2)  PROTIME-INR  APTT  ETHANOL  AMMONIA  TSH  T4, FREE  T3, FREE  LACTIC ACID, PLASMA  LACTIC ACID, PLASMA  CBG MONITORING, ED                                                                                                                          Radiology DG Chest Portable 1 View  Result Date: 07/24/2022 CLINICAL DATA:  Altered mental status EXAM: PORTABLE CHEST 1 VIEW COMPARISON:  Chest radiograph dated 06/09/2021 FINDINGS: Low lung volumes. No focal consolidations. No pleural effusion or pneumothorax. Similar enlarged cardiomediastinal silhouette. No acute osseous abnormality. IMPRESSION: Low lung volumes without focal consolidation. Electronically Signed   By: Agustin Cree M.D.   On: 07/24/2022 14:50   CT ANGIO HEAD NECK W WO CM W PERF (CODE STROKE)  Result Date: 07/24/2022 CLINICAL DATA:  Neuro deficit, acute, stroke suspected EXAM: EXAM CT ANGIOGRAPHY HEAD AND NECK WITH AND WITHOUT CONTRAST TECHNIQUE: TECHNIQUE Multidetector CT imaging of the head and neck was performed using the standard protocol during bolus administration of intravenous contrast. Multiplanar CT image  reconstructions and MIPs were obtained to evaluate the vascular anatomy. Carotid stenosis measurements (when applicable) are obtained utilizing NASCET criteria, using the distal internal carotid diameter as the denominator. RADIATION DOSE REDUCTION: This exam was performed according to the departmental dose-optimization program which includes automated exposure control, adjustment of the mA and/or kV according to patient size and/or use of iterative reconstruction technique. COMPARISON:  CT head from the same day. FINDINGS: CTA NECK: Aorta: Great vessel origins are patent without significant stenosis. Right carotid system: Patent without significant (greater than 50%) stenosis. Left carotid system: Patent without significant (greater than 50%) stenosis. Vertebral arteries: Right dominant. Both vertebral arteries are patent in  the neck without greater than 50% stenosis. Neck: No acute abnormality on limited assessment. Osseous: No acute abnormality on limited assessment. Severe multilevel degenerative change including bulky osteophytes and multilevel ankylosis. CTA HEAD: Anterior circulation: Bilateral intracranial ICAs, MCAs, and ACAs are patent without proximal hemodynamically significant stenosis. Posterior circulation: Severe stenosis versus occlusion of the small/non dominant intradural vertebral artery at its dural margin. Reconstitution distally of a small intradural vertebral artery. Right intradural vertebral artery is patent. Severe stenosis of the mid basilar artery. Prominent bilateral posterior communicating arteries with small distal vertebrobasilar system, anatomic variant. Both posterior cerebral arteries are patent without proximal hemodynamically significant stenosis. IMPRESSION: 1. No emergent large vessel occlusion. 2. Severe stenosis versus occlusion of a diminutive nondominant left intradural vertebral artery is dural margin with reconstitution distally. 3. Severe stenosis of the basilar artery.  Electronically Signed   By: Feliberto Harts M.D.   On: 07/24/2022 12:30   CT HEAD CODE STROKE WO CONTRAST  Result Date: 07/24/2022 CLINICAL DATA:  Code stroke. Neuro deficit, acute, stroke suspected no lateralizing signs provided. EXAM: CT HEAD WITHOUT CONTRAST TECHNIQUE: Contiguous axial images were obtained from the base of the skull through the vertex without intravenous contrast. RADIATION DOSE REDUCTION: This exam was performed according to the departmental dose-optimization program which includes automated exposure control, adjustment of the mA and/or kV according to patient size and/or use of iterative reconstruction technique. COMPARISON:  MR Brain 06/09/21 FINDINGS: Brain: No evidence of acute infarction, hemorrhage, hydrocephalus, extra-axial collection or mass lesion/mass effect. Sequela of severe chronic microvascular ischemic change. Generalized volume loss. Vascular: No hyperdense vessel or unexpected calcification. Skull: Mild soft tissue thickening along the right parietal scalp Sinuses/Orbits: No middle ear or mastoid effusion. Paranasal sinuses are clear. Orbits are unremarkable. Other: None. ASPECTS [facility] Stroke Program Early CT Score): 10 when accounting for chronic findings IMPRESSION: No hemorrhage or CT evidence of an acute cortical infarcts. Aspects is 10 when accounting for chronic findings. Findings were paged to Dr. Derry Lory on 07/24/2022 at 11:55 a.m. via [facility] paging system. Electronically Signed   By: Lorenza Cambridge M.D.   On: 07/24/2022 11:56    Pertinent labs & imaging results that were available during my care of the patient were reviewed by me and considered in my medical decision making (see MDM for details).  Medications Ordered in ED Medications  lactated ringers infusion (has no administration in time range)  ceFEPIme (MAXIPIME) 2 g in sodium chloride 0.9 % 100 mL IVPB (has no administration in time range)  metroNIDAZOLE (FLAGYL) IVPB 500 mg (has no  administration in time range)  vancomycin (VANCOCIN) IVPB 1000 mg/200 mL premix (has no administration in time range)  ceFEPIme (MAXIPIME) 2 g in sodium chloride 0.9 % 100 mL IVPB (has no administration in time range)  vancomycin (VANCOREADY) IVPB 750 mg/150 mL (has no administration in time range)  sodium chloride flush (NS) 0.9 % injection 3 mL (3 mLs Intravenous Given 07/24/22 1417)  iohexol (OMNIPAQUE) 350 MG/ML injection 100 mL (100 mLs Intravenous Contrast Given 07/24/22 1218)  levETIRAcetam (KEPPRA) IVPB 1500 mg/ 100 mL premix (0 mg Intravenous Stopped 07/24/22 1305)  Procedures .Critical Care  Performed by: Sloan Leiter, DO Authorized by: Sloan Leiter, DO   Critical care provider statement:    Critical care time (minutes):  49   Critical care time was exclusive of:  Separately billable procedures and treating other patients   Critical care was necessary to treat or prevent imminent or life-threatening deterioration of the following conditions:  CNS failure or compromise   Critical care was time spent personally by me on the following activities:  Development of treatment plan with patient or surrogate, discussions with consultants, evaluation of patient's response to treatment, examination of patient, ordering and review of laboratory studies, ordering and review of radiographic studies, ordering and performing treatments and interventions, pulse oximetry, re-evaluation of patient's condition, review of old charts and obtaining history from patient or surrogate   Care discussed with: admitting provider     (including critical care time)  Medical Decision Making / ED Course    Medical Decision Making:    Tara Dixon is a 89 y.o. female with past medical history as below, significant for Alzheimer's dementia, hypertension, thrombocytopenia, physical  deconditioning, failure to thrive who presents to the ED with complaint of AMS/stroke alert. . The complaint involves an extensive differential diagnosis and also carries with it a high risk of complications and morbidity.  Serious etiology was considered. Ddx includes but is not limited to: Differential diagnoses for altered mental status includes but is not exclusive to alcohol, illicit or prescription medications, intracranial pathology such as stroke, intracerebral hemorrhage, fever or infectious causes including sepsis, hypoxemia, uremia, trauma, endocrine related disorders such as diabetes, hypoglycemia, thyroid-related diseases, etc.   Complete initial physical exam performed, notably the patient  was mute, non compliant with neuro testing.    Reviewed and confirmed nursing documentation for past medical history, family history, social history.  Vital signs reviewed.    Clinical Course as of 07/24/22 1625  Mon Jul 24, 2022  1236 Temp(!): 93.2 F (34 C) Apply bair hugger, rectal temp [SG]  1501 She is DNR [SG]  1501 Temp(!): 93.7 F (34.3 C) Temp gradually improving   [SG]    Clinical Course User Index [SG] Tanda Rockers A, DO    CT head and CTA without obvious bleed or LVO Core temperature 93, placed on Bair hugger. UA without obvious infection.  Chronic thrombocytopenia similar today and 81 Hgb similar to baseline Concern for focal seizure given history of right-sided gaze deviation, "bubbling" at the mouth and altered level of responsiveness.  Loaded with Keppra by neurology team Continuous EEG, MRI She is hypothermic, abnormal vital signs, no clear source of infection or suspicion at this time. SIRS. Will start broad spectrum abx after obtaining blood ctx.  Continue seizure precautions, keppra load by neuro, EEG in progress.  Admit to [facility], Dr Katrinka Blazing accepting. Neuro on consult       Additional history obtained: -Additional history obtained from family and ems -External  records from outside source obtained and reviewed including: Chart review including previous notes, labs, imaging, consultation notes including prior ED visits, prior admission, prior labs and imaging, medications   Lab Tests: -I ordered, reviewed, and interpreted labs.   The pertinent results include:   Labs Reviewed  CBC - Abnormal; Notable for the following components:      Result Value   WBC 3.8 (*)    Hemoglobin 10.4 (*)    HCT 32.9 (*)    MCV 76.2 (*)    MCH 24.1 (*)  RDW 17.2 (*)    Platelets 81 (*)    nRBC 0.5 (*)    All other components within normal limits  DIFFERENTIAL - Abnormal; Notable for the following components:   Neutro Abs 1.1 (*)    All other components within normal limits  COMPREHENSIVE METABOLIC PANEL - Abnormal; Notable for the following components:   Glucose, Bld 103 (*)    Albumin 3.1 (*)    All other components within normal limits  URINALYSIS, ROUTINE W REFLEX MICROSCOPIC - Abnormal; Notable for the following components:   Specific Gravity, Urine 1.031 (*)    All other components within normal limits  VITAMIN B12 - Abnormal; Notable for the following components:   Vitamin B-12 1,793 (*)    All other components within normal limits  FOLATE - Abnormal; Notable for the following components:   Folate 5.9 (*)    All other components within normal limits  I-STAT CHEM 8, ED - Abnormal; Notable for the following components:   Glucose, Bld 102 (*)    Hemoglobin 11.9 (*)    HCT 35.0 (*)    All other components within normal limits  I-STAT VENOUS BLOOD GAS, ED - Abnormal; Notable for the following components:   pH, Ven 7.494 (*)    pCO2, Ven 37.7 (*)    pO2, Ven 70 (*)    Bicarbonate 29.0 (*)    Acid-Base Excess 5.0 (*)    HCT 33.0 (*)    Hemoglobin 11.2 (*)    All other components within normal limits  CULTURE, BLOOD (ROUTINE X 2)  CULTURE, BLOOD (ROUTINE X 2)  PROTIME-INR  APTT  ETHANOL  AMMONIA  TSH  T4, FREE  T3, FREE  LACTIC ACID,  PLASMA  LACTIC ACID, PLASMA  CBG MONITORING, ED    Notable for as above, thrombocytopenia, chronic anemia  EKG   EKG Interpretation  Date/Time:  Monday July 24 2022 12:19:18 EDT Ventricular Rate:  82 PR Interval:  235 QRS Duration: 128 QT Interval:  398 QTC Calculation: 465 R Axis:   -30 Text Interpretation: Sinus rhythm Prolonged PR interval Consider left atrial enlargement LVH with secondary repolarization abnormality similar prior no stemi Interpretation limited secondary to artifact Confirmed by Tanda Rockers ([id number]) on 07/24/2022 4:24:55 PM         Imaging Studies ordered: I ordered imaging studies including CT head, CTA, MRI, chest x-ray I independently visualized the following imaging with scope of interpretation limited to determining acute life threatening conditions related to emergency care; findings noted above, significant for no large ICH, no obvious LVO I independently visualized and interpreted imaging. I agree with the radiologist interpretation   Medicines ordered and prescription drug management: Meds ordered this encounter  Medications   sodium chloride flush (NS) 0.9 % injection 3 mL   iohexol (OMNIPAQUE) 350 MG/ML injection 100 mL   levETIRAcetam (KEPPRA) IVPB 1500 mg/ 100 mL premix   lactated ringers infusion   ceFEPIme (MAXIPIME) 2 g in sodium chloride 0.9 % 100 mL IVPB    Order Specific Question:   Antibiotic Indication:    Answer:   Other Indication (list below)    Order Specific Question:   Other Indication:    Answer:   Unknown source   metroNIDAZOLE (FLAGYL) IVPB 500 mg    Order Specific Question:   Antibiotic Indication:    Answer:   Other Indication (list below)    Order Specific Question:   Other Indication:    Answer:   Unknown source  vancomycin (VANCOCIN) IVPB 1000 mg/200 mL premix    Order Specific Question:   Indication:    Answer:   Other Indication (list below)    Order Specific Question:   Other Indication:    Answer:   Unknown  source   ceFEPIme (MAXIPIME) 2 g in sodium chloride 0.9 % 100 mL IVPB    Order Specific Question:   Antibiotic Indication:    Answer:   Sepsis   vancomycin (VANCOREADY) IVPB 750 mg/150 mL    Order Specific Question:   Indication:    Answer:   Sepsis    -I have reviewed the patients home medicines and have made adjustments as needed   Consultations Obtained: I requested consultation with the neuro,  and discussed lab and imaging findings as well as pertinent plan - they recommend: admit   Cardiac Monitoring: The patient was maintained on a cardiac monitor.  I personally viewed and interpreted the cardiac monitored which showed an underlying rhythm of: NSR  Social Determinants of Health:  Diagnosis or treatment significantly limited by social determinants of health: obesity   Reevaluation: After the interventions noted above, I reevaluated the patient and found that they have stayed the same  Co morbidities that complicate the patient evaluation  Past Medical History:  Diagnosis Date   Allergy    Alzheimer disease (HCC)    Anemia    Arthritis    Hypertension       Dispostion: Disposition decision including need for hospitalization was considered, and patient admitted to the hospital.    Final Clinical Impression(s) / ED Diagnoses Final diagnoses:  Altered mental status, unspecified altered mental status type  Alzheimer's dementia, unspecified dementia severity, unspecified timing of dementia onset, unspecified whether behavioral, psychotic, or mood disturbance or anxiety (HCC)  SIRS (systemic inflammatory response syndrome) (HCC)  Encephalopathy     This chart was dictated using voice recognition software.  Despite best efforts to proofread,  errors can occur which can change the documentation meaning.    Tanda Rockers A, DO 07/24/22 1626

## 2022-07-24 NOTE — ED Notes (Signed)
Bair hugger placed on pt.

## 2022-07-24 NOTE — Plan of Care (Signed)
  Problem: Health Behavior/Discharge Planning: Goal: Ability to manage health-related needs will improve Outcome: Progressing   

## 2022-07-24 NOTE — Consult Note (Signed)
NEUROLOGY CONSULTATION NOTE   Date of service: July 24, 2022 Patient Name: Tara Dixon MRN:  [id number] DOB:  1932-01-29 Reason for consult: "Aphasia, R gaze preference" Requesting Provider: Sloan Leiter, DO _ _ _   _ __   _ __ _ _  __ __   _ __   __ _  History of Present Illness  Tara Dixon is a 89 y.o. female with PMH significant for advanced alzheimers with failure to thrive, anemia, thrombocytopenia, HTN, who went to bed last night at 2200. Daughter changed her at 0500 and she was at her baseline. Daughter tried to wake her up in AM and she was not very responsive but her eyes were open, bubbling at her mouth and would not blink to threat.  She seemed like she was gasping for air. Daughter called EMS and she was noted to be looking to her right, not answering questions or following commands. She was brought in as a stroke code.  On arrival, looks to her right, eyes do not cross midline. Mute, does not follow commands or answer any questions.  At baseline, [name] is entirely dependent on her daughter to feed her, get her up, dress and undress herself, give her water and food.  LKW: 0500 mRS: 5 tNKASE: not offered, low prior platelet count, outside window Thrombectomy: not offered, no LVO NIHSS components Score: Comment  1a Level of Conscious 0[x]  1[]  2[]  3[]      1b LOC Questions 0[]  1[]  2[x]       1c LOC Commands 0[]  1[]  2[x]       2 Best Gaze 0[]  1[x]  2[]       3 Visual 0[]  1[]  2[x]  3[]      4 Facial Palsy 0[x]  1[]  2[]  3[]      5a Motor Arm - left 0[]  1[]  2[x]  3[]  4[]  UN[]    5b Motor Arm - Right 0[]  1[]  2[x]  3[]  4[]  UN[]    6a Motor Leg - Left 0[]  1[]  2[]  3[x]  4[]  UN[]    6b Motor Leg - Right 0[]  1[]  2[]  3[x]  4[]  UN[]    7 Limb Ataxia 0[x]  1[]  2[]  3[]  UN[]     8 Sensory 0[x]  1[]  2[]  UN[]      9 Best Language 0[]  1[]  2[]  3[x]      10 Dysarthria 0[]  1[]  2[x]  UN[]      11 Extinct. and Inattention 0[x]  1[]  2[]       TOTAL: 22      ROS   Unable to get detailed ROS 2/2  aphasia/encephalopathy.  Past History   Past Medical History:  Diagnosis Date   Allergy    Alzheimer disease (HCC)    Anemia    Arthritis    Hypertension    Past Surgical History:  Procedure Laterality Date   ABDOMINAL HYSTERECTOMY     ROTATOR CUFF REPAIR Right    Family History  Problem Relation Age of Onset   Hypertension Mother    Stroke Mother    Hypertension Father    Stroke Father    Hypertension Sister    Social History   Socioeconomic History   Marital status: Widowed    Spouse name: Not on file   Number of children: Not on file   Years of education: Not on file   Highest education level: Not on file  Occupational History   Not on file  Tobacco Use   Smoking status: Never   Smokeless tobacco: Never  Substance and Sexual Activity   Alcohol use: Not on file   Drug  use: Not on file   Sexual activity: Not on file  Other Topics Concern   Not on file  Social History Narrative   Not on file   Social Determinants of Health   Financial Resource Strain: Not on file  Food Insecurity: Not on file  Transportation Needs: Not on file  Physical Activity: Not on file  Stress: Not on file  Social Connections: Not on file   Allergies  Allergen Reactions   Ampicillin Other (See Comments)    unknown   Bactrim [Sulfamethoxazole-Trimethoprim] Other (See Comments)    unknown   Celebrex [Celecoxib] Other (See Comments)    unknown   Donepezil     Other reaction(s): intolerant   Penicillins Other (See Comments)    unknown   Polysporin [Bacitracin-Polymyxin B] Other (See Comments)    unknown    Medications  (Not in a hospital admission)    Vitals   Vitals:   07/24/22 1100  Weight: 68.8 kg     Body mass index is 26.87 kg/m.  Physical Exam   General: Laying comfortably in bed; in no acute distress.  HENT: Normal oropharynx and mucosa. Normal external appearance of ears and nose.  Neck: Supple, no pain or tenderness  CV: No JVD. No peripheral edema.   Pulmonary: Symmetric Chest rise. Normal respiratory effort.  Abdomen: Soft to touch, non-tender.  Ext: No cyanosis, edema, or deformity  Skin: No rash. Normal palpation of skin.   Musculoskeletal: Normal digits and nails by inspection. No clubbing.   Neurologic Examination  Mental status/Cognition: Alert, does not answer any orientation questions. Speech/language: mute, no speech. Cranial nerves:   CN II Pupils equal and reactive to light, does not blink to threat on the left.   CN III,IV,VI R gaze preference.   CN V Regards touch BL, corneals intact BL   CN VII no asymmetry, no nasolabial fold flattening    CN VIII Does not make eye contact or regard speech   CN IX & X normal palatal elevation, no uvular deviation    CN XI Head turned more to the right.   CN XII midline tongue but does not protrude on command.   Motor/sensory:  Muscle bulk: poor, tone normal BL upper ext drop to the bed when held up off the bed. Spontaneous movement in BL uppers and moves left side more than right. Withdraws BL lower ext to proximal pinch BL   Coordination/Complex Motor:  Unable to assess.  Labs   CBC:  Recent Labs  Lab 07/24/22 1144  HGB 11.9*  HCT 35.0*    Basic Metabolic Panel:  Lab Results  Component Value Date   NA 141 07/24/2022   K 3.9 07/24/2022   CO2 27 06/11/2021   GLUCOSE 102 (H) 07/24/2022   BUN 22 07/24/2022   CREATININE 0.50 07/24/2022   CALCIUM 8.7 (L) 06/11/2021   GFRNONAA >60 06/11/2021   GFRAA >60 11/04/2019   Lipid Panel:  Lab Results  Component Value Date   LDLCALC 83 06/10/2021   HgbA1c:  Lab Results  Component Value Date   HGBA1C 5.6 06/10/2021   Urine Drug Screen: No results found for: "LABOPIA", "COCAINSCRNUR", "LABBENZ", "AMPHETMU", "THCU", "LABBARB"  Alcohol Level No results found for: "ETH"  CT Head without contrast(Personally reviewed): CTH was negative for a large hypodensity concerning for a large territory infarct or hyperdensity  concerning for an ICH  CT angio Head and Neck with contrast(Personally reviewed): No LVO  MRI Brain: pending  cEEG:  pending  Impression   Tara Dixon is a 89 y.o. female with PMH significant for advanced alzheimers with failure to thrive, anemia, thrombocytopenia, HTN who was at her baseline at 5 AM this morning and then essentially unresponsive with her eyes open and bubbling in her mouth.  EMS noted right gaze deviation and aphasia and she was brought in as a code stroke.  CT head without contrast with no ICH or other acute intracranial abnormalities, CTA head and neck with no LVO.  Differential include focal left hemispheric seizure, potential metabolic encephalopathy, acute stroke or just delirium.  Would expect infection, specifically meningitis to have a more subacute course over the course of a day or 2 rather than sudden worsening over few hours.  She was outside the window for TNKase at the time of presentation.  She was not a candidate for thrombectomy due to no LVO.  Recommendations  - metabolic and infectious workup with cbc, CXR, UA. - MRI brain w/o C - LTM EEG with MRI compatible leads. - Keppra 20mg /Kg IV once. Further AEDs based on LTM. - encephalopathy labs with ammonia, TSH, B12, Folate. ______________________________________________________________________   Thank you for the opportunity to take part in the care of this patient. If you have any further questions, please contact the neurology consultation attending.  Signed,  Erick Blinks [facility] _ _ _   _ __   _ __ _ _  __ __   _ __   __ _

## 2022-07-25 ENCOUNTER — Other Ambulatory Visit: Payer: Self-pay

## 2022-07-25 ENCOUNTER — Observation Stay (HOSPITAL_BASED_OUTPATIENT_CLINIC_OR_DEPARTMENT_OTHER): Payer: Medicare PPO

## 2022-07-25 ENCOUNTER — Observation Stay (HOSPITAL_COMMUNITY): Payer: Medicare PPO

## 2022-07-25 DIAGNOSIS — Z882 Allergy status to sulfonamides status: Secondary | ICD-10-CM | POA: Diagnosis not present

## 2022-07-25 DIAGNOSIS — K5900 Constipation, unspecified: Secondary | ICD-10-CM | POA: Diagnosis present

## 2022-07-25 DIAGNOSIS — G309 Alzheimer's disease, unspecified: Secondary | ICD-10-CM | POA: Diagnosis present

## 2022-07-25 DIAGNOSIS — Z7401 Bed confinement status: Secondary | ICD-10-CM | POA: Diagnosis not present

## 2022-07-25 DIAGNOSIS — R4182 Altered mental status, unspecified: Secondary | ICD-10-CM | POA: Diagnosis not present

## 2022-07-25 DIAGNOSIS — I4891 Unspecified atrial fibrillation: Secondary | ICD-10-CM | POA: Diagnosis not present

## 2022-07-25 DIAGNOSIS — Z886 Allergy status to analgesic agent status: Secondary | ICD-10-CM | POA: Diagnosis not present

## 2022-07-25 DIAGNOSIS — G9340 Encephalopathy, unspecified: Secondary | ICD-10-CM | POA: Diagnosis present

## 2022-07-25 DIAGNOSIS — F0280 Dementia in other diseases classified elsewhere without behavioral disturbance: Secondary | ICD-10-CM | POA: Diagnosis not present

## 2022-07-25 DIAGNOSIS — Z880 Allergy status to penicillin: Secondary | ICD-10-CM | POA: Diagnosis not present

## 2022-07-25 DIAGNOSIS — Z66 Do not resuscitate: Secondary | ICD-10-CM | POA: Diagnosis present

## 2022-07-25 DIAGNOSIS — I10 Essential (primary) hypertension: Secondary | ICD-10-CM

## 2022-07-25 DIAGNOSIS — R680 Hypothermia, not associated with low environmental temperature: Secondary | ICD-10-CM | POA: Diagnosis present

## 2022-07-25 DIAGNOSIS — D696 Thrombocytopenia, unspecified: Secondary | ICD-10-CM | POA: Diagnosis not present

## 2022-07-25 DIAGNOSIS — I5032 Chronic diastolic (congestive) heart failure: Secondary | ICD-10-CM | POA: Diagnosis present

## 2022-07-25 DIAGNOSIS — I480 Paroxysmal atrial fibrillation: Secondary | ICD-10-CM | POA: Diagnosis present

## 2022-07-25 DIAGNOSIS — R4701 Aphasia: Secondary | ICD-10-CM | POA: Diagnosis present

## 2022-07-25 DIAGNOSIS — Z8249 Family history of ischemic heart disease and other diseases of the circulatory system: Secondary | ICD-10-CM | POA: Diagnosis not present

## 2022-07-25 DIAGNOSIS — Z7189 Other specified counseling: Secondary | ICD-10-CM | POA: Diagnosis not present

## 2022-07-25 DIAGNOSIS — Z888 Allergy status to other drugs, medicaments and biological substances status: Secondary | ICD-10-CM | POA: Diagnosis not present

## 2022-07-25 DIAGNOSIS — I110 Hypertensive heart disease with heart failure: Secondary | ICD-10-CM | POA: Diagnosis present

## 2022-07-25 DIAGNOSIS — E669 Obesity, unspecified: Secondary | ICD-10-CM | POA: Diagnosis present

## 2022-07-25 DIAGNOSIS — G9341 Metabolic encephalopathy: Secondary | ICD-10-CM | POA: Diagnosis present

## 2022-07-25 DIAGNOSIS — E876 Hypokalemia: Secondary | ICD-10-CM | POA: Diagnosis present

## 2022-07-25 DIAGNOSIS — F02818 Dementia in other diseases classified elsewhere, unspecified severity, with other behavioral disturbance: Secondary | ICD-10-CM | POA: Diagnosis present

## 2022-07-25 DIAGNOSIS — R1312 Dysphagia, oropharyngeal phase: Secondary | ICD-10-CM | POA: Diagnosis present

## 2022-07-25 DIAGNOSIS — R569 Unspecified convulsions: Secondary | ICD-10-CM | POA: Diagnosis not present

## 2022-07-25 DIAGNOSIS — Z823 Family history of stroke: Secondary | ICD-10-CM | POA: Diagnosis not present

## 2022-07-25 DIAGNOSIS — D61818 Other pancytopenia: Secondary | ICD-10-CM | POA: Diagnosis present

## 2022-07-25 DIAGNOSIS — Z515 Encounter for palliative care: Secondary | ICD-10-CM | POA: Diagnosis not present

## 2022-07-25 DIAGNOSIS — D509 Iron deficiency anemia, unspecified: Secondary | ICD-10-CM | POA: Diagnosis present

## 2022-07-25 DIAGNOSIS — I679 Cerebrovascular disease, unspecified: Secondary | ICD-10-CM

## 2022-07-25 DIAGNOSIS — Z8673 Personal history of transient ischemic attack (TIA), and cerebral infarction without residual deficits: Secondary | ICD-10-CM | POA: Diagnosis not present

## 2022-07-25 DIAGNOSIS — T68XXXA Hypothermia, initial encounter: Secondary | ICD-10-CM | POA: Diagnosis not present

## 2022-07-25 DIAGNOSIS — R6510 Systemic inflammatory response syndrome (SIRS) of non-infectious origin without acute organ dysfunction: Secondary | ICD-10-CM | POA: Diagnosis present

## 2022-07-25 LAB — GLUCOSE, CAPILLARY
Glucose-Capillary: 132 mg/dL — ABNORMAL HIGH (ref 70–99)
Glucose-Capillary: 115 mg/dL — ABNORMAL HIGH (ref 70–99)
Glucose-Capillary: 103 mg/dL — ABNORMAL HIGH (ref 70–99)
Glucose-Capillary: 96 mg/dL (ref 70–99)
Glucose-Capillary: 88 mg/dL (ref 70–99)

## 2022-07-25 LAB — BASIC METABOLIC PANEL
Anion gap: 11 (ref 5–15)
BUN: 17 mg/dL (ref 8–23)
CO2: 22 mmol/L (ref 22–32)
Calcium: 9.2 mg/dL (ref 8.9–10.3)
Chloride: 108 mmol/L (ref 98–111)
Creatinine, Ser: 0.69 mg/dL (ref 0.44–1.00)
GFR, Estimated: >60 mL/min (ref >60)
Glucose, Bld: 143 mg/dL — ABNORMAL HIGH (ref 70–99)
Potassium: 4 mmol/L (ref 3.5–5.1)
Sodium: 141 mmol/L (ref 135–145)

## 2022-07-25 LAB — CBC
HCT: 28.4 % — ABNORMAL LOW (ref 36.0–46.0)
Hemoglobin: 9.7 g/dL — ABNORMAL LOW (ref 12.0–15.0)
MCH: 25.6 pg — ABNORMAL LOW (ref 26.0–34.0)
MCHC: 34.2 g/dL (ref 30.0–36.0)
MCV: 74.9 fL — ABNORMAL LOW (ref 80.0–100.0)
Platelets: 76 10*3/uL — ABNORMAL LOW (ref 150–400)
RBC: 3.79 MIL/uL — ABNORMAL LOW (ref 3.87–5.11)
RDW: 16.6 % — ABNORMAL HIGH (ref 11.5–15.5)
WBC: 5.6 10*3/uL (ref 4.0–10.5)
nRBC: 0.7 % — ABNORMAL HIGH (ref 0.0–0.2)

## 2022-07-25 LAB — MAGNESIUM: Magnesium: 1.9 mg/dL (ref 1.7–2.4)

## 2022-07-25 LAB — T3, FREE: T3, Free: 2.3 pg/mL (ref 2.0–4.4)

## 2022-07-25 LAB — ECHOCARDIOGRAM COMPLETE
Area-P 1/2: 3.31 cm2
Height: 61.496 in
S' Lateral: 2.4 cm
Weight: 2426.82 oz

## 2022-07-25 MED ORDER — SODIUM CHLORIDE 0.9 % IV SOLN
1.0000 g | Freq: Three times a day (TID) | INTRAVENOUS | Status: DC
  Administered 2022-07-25 – 2022-07-26 (×3): 1 g via INTRAVENOUS
  Filled 2022-07-25 (×5): qty 5

## 2022-07-25 NOTE — Progress Notes (Signed)
Progress Note   Patient: Tara Dixon ACZ:[id number] DOB: [birth year] DOA: 07/24/2022     0 DOS: the patient was seen and examined on 07/25/2022 at 1:25PM      Brief hospital course: Mrs. Laraia is an 89 y.o. F with dementia, lives at home, HTN, dCHF, and hx CVA who presented with acute episode of rigidity, head held to side, and gurgling.  In the ER, Neuro consulted, recommend LTM EEG and MRI brain.   6/3: Admitted on fluids, antibiotics; Neuro consulted 6/4: LTM EEG with diffuse encephalopathy only; MRI brain unremarkable     Assessment and Plan: * Acute metabolic encephalopathy At baseline, patient has advanced dementia (mostly bedbound, but intermittently verbal, feeds herself with her hands, but responsive and interactive).  Here, she is obtunded and poorly responsive.  No signs of hypernatremia or AKI or symptomatic electrolyte disturbance.  No response to antibitoics and fluids so far.  TSH, folate, B12, ammonia all normal.  Long-term EEG showed no repeated seizures.  MRI brain unremarkable. - Continue fluids and antibiotics - Switch cefepime to Aztreonam - Consult Neurology, appreciate cares - Consult Palliative Care  - Hold sedating medications, home Haldol and Seroquel  Totally obtunded - Continue maintenance IV fluids    Hypothermia Unclear cause.  CXR without focal disease.  UA without inflammation.   - Continue empiric antibiotics 24 more hours - Follow blood cultures - Low threshold to stop antibiotics without source of infection or improvement  Paroxysmal atrial fibrillation This is new.  Observed on telemetry only, transient and self-limited.  In sinus now. - Hold anticoagulation for now until goals of care are clearer   Thrombocytopenia [facility]) This has been present since 2023, unclear cause.  76K today.  Dementia with behavioral disturbance (HCC) FAST 7 prior to admission, now more obtunded.  Cerebrovascular disease Not on home aspirin or Plavix  or statin.  MRI brain shows no acute stroke.  Chronic diastolic CHF (congestive heart failure) (HCC) Not on home diuretics, appears euvolemic to dehydrated  HTN (hypertension) BP controlled off medications          Subjective: Patient is nonverbal.  She does not open the eyes.  She makes no response to touch and groans with noxious stimuli.  Nursing note no cahnge, family note no change.  No fever or seizures.     Physical Exam: BP 133/80 (BP Location: Right Arm)   Pulse 63   Temp 97.7 F (36.5 C) (Oral)   Resp 20   Ht 5' 1.5" (1.562 m)   Wt 68.8 kg   SpO2 96%   BMI 28.20 kg/m   Elderly adult female with some loss of muscle mass and fat, unresponsive RRR no murmurs, no pitting distal edema RR normal, shallow, unable to cooperate with exam, but no wheezing or rales appreciated No grimace to palpation of abdomen, no distension or ascites Eyes clenched shut.  Arms held tightly to chest, strong resistance bilaterally to extension.  No spontaneous verbalizations or movements.     Data Reviewed: Discussed with Neurology TSH normal, ammonia normal, B12 normal Urinalysis without cells Chest x-ray clear VBG unremarkable Comprehensive metabolic panel normal White blood cell count 3.8, hemoglobin 9.9, essentially stable, platelets 9.7 Pancytopenia ruled out   Family Communication: Daughter and son-in-law at the bedside    Disposition: Status is: Inpatient The patient was admitted with change in mentation, decreased responsiveness  Her baseline appears to be fairly limited, although family related they feel this is a very large change in her  clinical status  Given the neurological workup to date has been negative, I am concerned that this is an illness from which she will not recover  If she has no improvement from fluids and antibiotics in the next 24 hours, I would recommend transitioning to comfort measures        Author: Alberteen Sam,  MD 07/25/2022 4:24 PM  For on call review [email or web address].

## 2022-07-25 NOTE — Evaluation (Addendum)
Physical Therapy Evaluation Patient Details Name: Mechel Patras MRN: [id number] DOB: March 03, 1931 Today's Date: 07/25/2022  History of Present Illness  Jalayna Shagena is a 89 y.o. female who presents 07/24/22 after being noted by family to be acutely altered. At baseline she is bedbound and needs assistance with all of her ADLs except for feeding herself. Daughter states that normally she is able to talk.  She is normally alert and oriented to self and is able to recognize family. MRI negative for acute abnormality and EEG negative for seizure. Medical history significant of hypertension, diastolic congestive heart failure, Alzeimer's dementia.   Clinical Impression  Pt with high levels of somnolence and doesn't respond to verbal or tactile arousal tactics. Pt with total A x2 person to achieve seated EOB dangle. Pt w min-modA for seated balance, presents with R lateral lean. Pt unable to open their eyes throughout the session even w maximal cueing, cannot follow any commands, only groans during mobilization. Pt with tightness in all extremities resulting in restricted PROM. Pt would benefit from continued acute therapy trial to facilitate improvements in joint integrity, ROM, strength, bed mobility, and transfer training pending improvements in pt's level of arousal and initiation. Don't anticipate need for follow-up PT.     Recommendations for follow up therapy are one component of a multi-disciplinary discharge planning process, led by the attending physician.  Recommendations may be updated based on patient status, additional functional criteria and insurance authorization.  Follow Up Recommendations       Assistance Recommended at Discharge Frequent or constant Supervision/Assistance  Patient can return home with the following  Two people to help with walking and/or transfers;Two people to help with bathing/dressing/bathroom;Assistance with feeding;Assistance with cooking/housework;Direct  supervision/assist for medications management;Direct supervision/assist for financial management;Assist for transportation;Help with stairs or ramp for entrance    Equipment Recommendations Other (comment) (tilt in space wheelchair)  Recommendations for Other Services       Functional Status Assessment Patient has had a recent decline in their functional status and/or demonstrates limited ability to make significant improvements in function in a reasonable and predictable amount of time     Precautions / Restrictions Precautions Precautions: Fall Restrictions Weight Bearing Restrictions: No      Mobility  Bed Mobility Overal bed mobility: Needs Assistance Bed Mobility: Supine to Sit, Sit to Supine     Supine to sit: Total assist, +2 for physical assistance Sit to supine: +2 for physical assistance, Total assist   General bed mobility comments: Pt with no initation for bed mobility, requires total A to position EOB    Transfers                   General transfer comment: Pt with bed-level mobility at this time, no transfer performed due to pt's current level of arousal    Ambulation/Gait                  Stairs            Wheelchair Mobility    Modified Rankin (Stroke Patients Only)       Balance Overall balance assessment: Needs assistance Sitting-balance support: Feet supported, Bilateral upper extremity supported Sitting balance-Leahy Scale: Poor Sitting balance - Comments: Pt wtih total A for seated EOB initially, transitions to min-modA as time passes. Postural control: Right lateral lean  Pertinent Vitals/Pain Pain Assessment Pain Assessment: Faces Faces Pain Scale: Hurts a little bit    Home Living Family/patient expects to be discharged to:: Private residence Living Arrangements: Children Available Help at Discharge: Available 24 hours/day;Family Type of Home: House Home Access:  Stairs to enter;Ramped entrance   Entrance Stairs-Number of Steps: 2; uses ramp to get out of the house Alternate Level Stairs-Number of Steps: stair lift Home Layout: Two level Home Equipment: Wheelchair - Forensic psychologist (2 wheels);BSC/3in1;Hospital bed (w hoyer lift) Additional Comments: Unable to collect home-setup due to pt's somnolence and decreased level of arousal. Per PT eval note 06/14/22; above information collected.    Prior Function Prior Level of Function : Needs assist;Patient poor historian/Family not available  Cognitive Assist : Mobility (cognitive);ADLs (cognitive) Mobility (Cognitive): Step by step cues   Physical Assist : Mobility (physical);ADLs (physical) Mobility (physical): Bed mobility   Mobility Comments: pt w bed level mobility requiring total A x2 person       Hand Dominance        Extremity/Trunk Assessment   Upper Extremity Assessment Upper Extremity Assessment: Difficult to assess due to impaired cognition;RUE deficits/detail;LUE deficits/detail RUE Deficits / Details: tight and restricted PROM for elbow flexion, shoulder elevation, scaption motions, sup/pronation LUE Deficits / Details: Same as RUE    Lower Extremity Assessment Lower Extremity Assessment: LLE deficits/detail;RLE deficits/detail;Difficult to assess due to impaired cognition RLE Deficits / Details: Can achieve neutral dorsiflexion with PROM, decreased hip flexion, knee flexion, and abduction due to tightness LLE Deficits / Details: Same as RLE       Communication   Communication: Other (comment) (Pt with decreased level of arousal, doesn't open eyes, no communication attempted)  Cognition Arousal/Alertness: Lethargic Behavior During Therapy: WFL for tasks assessed/performed Overall Cognitive Status: No family/caregiver present to determine baseline cognitive functioning                                 General Comments: Pt with decreased level of arousal,  no response to verbal or tactile cues        General Comments General comments (skin integrity, edema, etc.): Pt would benefit from prevalon boots to maintain joint integrity and ROM    Exercises     Assessment/Plan    PT Assessment Patient needs continued PT services  PT Problem List Decreased strength;Decreased range of motion;Decreased activity tolerance;Decreased balance;Decreased mobility;Decreased cognition;Decreased safety awareness       PT Treatment Interventions Therapeutic exercise;Patient/family education;Functional mobility training    PT Goals (Current goals can be found in the Care Plan section)  Acute Rehab PT Goals Patient Stated Goal: None stated PT Goal Formulation: With patient Time For Goal Achievement: 08/08/22 Potential to Achieve Goals: Poor    Frequency Min 1X/week     Co-evaluation               AM-PAC PT "6 Clicks" Mobility  Outcome Measure Help needed turning from your back to your side while in a flat bed without using bedrails?: Total Help needed moving from lying on your back to sitting on the side of a flat bed without using bedrails?: Total Help needed moving to and from a bed to a chair (including a wheelchair)?: Total Help needed standing up from a chair using your arms (e.g., wheelchair or bedside chair)?: Total Help needed to walk in hospital room?: Total Help needed climbing 3-5 steps with a railing? : Total 6 Click Score:  6    End of Session   Activity Tolerance: Patient limited by lethargy Patient left: in bed;with call bell/phone within reach;with bed alarm set Nurse Communication: Other (comment) (Recommended prevalon boot order) PT Visit Diagnosis: Muscle weakness (generalized) (M62.81);Adult, failure to thrive (R62.7)    Time: 1610-9604 PT Time Calculation (min) (ACUTE ONLY): 19 min   Charges:   PT Evaluation $PT Eval Moderate Complexity: 1 Mod          Hendricks Milo, SPT  Acute Rehabilitation  Services   Hendricks Milo 07/25/2022, 3:39 PM

## 2022-07-25 NOTE — Assessment & Plan Note (Signed)
FAST 7 prior to admission, now more obtunded.

## 2022-07-25 NOTE — Assessment & Plan Note (Signed)
This is new.  Observed on telemetry only, transient and self-limited.  In sinus now. - Hold anticoagulation for now until goals of care are clearer

## 2022-07-25 NOTE — TOC Initial Note (Signed)
Transition of Care [facility]) - Initial/Assessment Note    Patient Details  Name: Tara Dixon MRN: [id number] Date of Birth: 1931/09/06  [facility] of [facility]) CM/SW Contact:    Kermit Balo, RN Phone Number: 07/25/2022, 12:56 PM  Clinical Narrative:                 CM met with the patient and her daughter at the bedside. Daughter provided information as pt slept through visit. Daughter is here from [place] to provide care for her mom. She hopes to get her back to [place] at some point. Per daughter pt has 24 hour care.  Daughter requesting a tilt wheelchair to make it easier on pt when they need to take her out. CM has asked [employer] to see if they can get her a wheelchair.  Pts primary NP sees her at the home. Her PCP does tele visits.  They have bed and lift at home.  TOC following for further d/c needs.   Expected Discharge Plan: Home/Self Care Barriers to Discharge: Continued Medical Work up   Patient Goals and CMS Choice            Expected Discharge Plan and Services   Discharge Planning Services: CM Consult   Living arrangements for the past 2 months: Single Family Home                 DME Arranged:  (tilt wheelchair) DME Agency: [employer] Date DME Agency Contacted: 07/25/22   Representative spoke with at DME Agency: Lelon Mast            Prior Living Arrangements/Services Living arrangements for the past 2 months: Single Family Home Lives with:: Adult Children Patient language and need for interpreter reviewed:: Yes          Care giver support system in place?: Yes (comment) Current home services: DME (hospital bed/ wheelchair/ lift) Criminal Activity/Legal Involvement Pertinent to Current Situation/Hospitalization: No - Comment as needed  Activities of Daily Living      Permission Sought/Granted                  Emotional Assessment Appearance:: Appears stated age         Psych Involvement: No (comment)  Admission diagnosis:   Encephalopathy [G93.40] SIRS (systemic inflammatory response syndrome) (HCC) [R65.10] Altered mental status, unspecified altered mental status type [R41.82] Acute metabolic encephalopathy [G93.41] Alzheimer's dementia, unspecified dementia severity, unspecified timing of dementia onset, unspecified whether behavioral, psychotic, or mood disturbance or anxiety (HCC) [G30.9, F02.80] Patient Active Problem List   Diagnosis Date Noted   Chronic diastolic CHF (congestive heart failure) (HCC) 07/25/2022   Cerebrovascular disease 07/25/2022   Hypothermia 07/24/2022   New onset atrial fibrillation (HCC) 07/24/2022   Thrombocytopenia (HCC) 06/11/2021   Dementia with behavioral disturbance (HCC) 06/10/2021   HTN (hypertension) 06/10/2021   Diarrhea 06/10/2021   Failure to thrive in adult 06/10/2021   Acute metabolic encephalopathy 06/09/2021   PCP:  Annita Brod, MD Pharmacy:   [facility] [id number] - [place], [place] - [address] AT [facility] [address] [place] [address] Phone: [phone] Fax: [phone]     Social Determinants of Health (SDOH) Social History: SDOH Screenings   Tobacco Use: Low Risk  (07/24/2022)   SDOH Interventions:     Readmission Risk Interventions     No data to display

## 2022-07-25 NOTE — Assessment & Plan Note (Addendum)
At baseline, patient has advanced dementia (mostly bedbound, but intermittently verbal, feeds herself with her hands, but responsive and interactive).  Here, she is obtunded and poorly responsive.  No signs of hypernatremia or AKI or symptomatic electrolyte disturbance.  No response to antibitoics and fluids so far.  TSH, folate, B12, ammonia all normal.  Long-term EEG showed no repeated seizures.  MRI brain unremarkable. - Continue fluids and antibiotics - Switch cefepime to Aztreonam - Consult Neurology, appreciate cares - Consult Palliative Care  - Hold sedating medications, home Haldol and Seroquel  Totally obtunded - Continue maintenance IV fluids

## 2022-07-25 NOTE — Progress Notes (Signed)
Pharmacy Antibiotic Note  Tara Dixon is a 89 y.o. female admitted on 07/24/2022 with sepsis.  Pharmacy has been consulted for vancomycin and aztreonam dosing; cefepime changed to aztreonam due to concern for neurotoxicity (PCN allergy also listed).   Tm 99.2, WBC normal, and renal function stable.  Plan: Aztreonam 1 g IV q8h Vancomycin 750 mg IV q24h for eAUC 434.  Vd of 0.72, Scr rounded to 0.8.  Monitor renal function, clinical progress, cultures/sensitivities F/U LOT and de-escalate as able Vancomycin levels as clinically indicated    Height: 5' 1.5" (156.2 cm) Weight: 68.8 kg (151 lb 10.8 oz) IBW/kg (Calculated) : 48.94  Temp (24hrs), Avg:97.5 F (36.4 C), Min:93.7 F (34.3 C), Max:99.2 F (37.3 C)  Recent Labs  Lab 07/24/22 1141 07/24/22 1144 07/24/22 1750 07/25/22 0648  WBC 3.8*  --   --  5.6  CREATININE 0.71 0.50  --  0.69  LATICACIDVEN  --   --  1.7  --      Estimated Creatinine Clearance: 43.7 mL/min (by C-G formula based on SCr of 0.69 mg/dL).    Allergies  Allergen Reactions   Ampicillin Other (See Comments)    unknown   Bactrim [Sulfamethoxazole-Trimethoprim] Other (See Comments)    unknown   Celebrex [Celecoxib] Other (See Comments)    unknown   Donepezil     Other reaction(s): intolerant   Penicillins Other (See Comments)    unknown   Polysporin [Bacitracin-Polymyxin B] Other (See Comments)    unknown   Thank you for involving pharmacy in this patient's care.  Loura Back, PharmD, BCPS Clinical Pharmacist Clinical phone for 07/25/2022 is [phone] 07/25/2022 1:36 PM

## 2022-07-25 NOTE — Hospital Course (Signed)
Tara Dixon is an 89 y.o. F with dementia, lives at home, HTN, dCHF, and hx CVA who presented with acute episode of rigidity, head held to side, and gurgling.  In the ER, Neuro consulted, recommend LTM EEG and MRI brain.

## 2022-07-25 NOTE — Progress Notes (Signed)
  Echocardiogram 2D Echocardiogram has been performed.  Maren Reamer 07/25/2022, 9:54 AM

## 2022-07-25 NOTE — Progress Notes (Signed)
    Durable Medical Equipment  (From admission, onward)           Start     Ordered   07/25/22 1236  For home use only DME standard manual wheelchair with seat cushion  Once       Comments: Patient suffers from weakness which impairs their ability to perform daily activities like bathing, dressing, grooming, and toileting in the home.  A walker will not resolve issue with performing activities of daily living. A wheelchair will allow patient to safely perform daily activities. Patient can safely propel the wheelchair in the home or has a caregiver who can provide assistance. Length of need Lifetime. Accessories: elevating leg rests (ELRs), wheel locks, extensions and anti-tippers. Reclining wheelchair--18 inch   07/25/22 1236

## 2022-07-25 NOTE — Progress Notes (Signed)
Arrived back to unit from MRI

## 2022-07-25 NOTE — Plan of Care (Signed)

## 2022-07-25 NOTE — Assessment & Plan Note (Signed)
Unclear cause.  CXR without focal disease.  UA without inflammation.   - Continue empiric antibiotics 24 more hours - Follow blood cultures - Low threshold to stop antibiotics without source of infection or improvement

## 2022-07-25 NOTE — Progress Notes (Signed)
LTM EEG discontinued - no skin breakdown at unhook.   

## 2022-07-25 NOTE — Assessment & Plan Note (Signed)
Not on home aspirin or Plavix or statin.  MRI brain shows no acute stroke.

## 2022-07-25 NOTE — Progress Notes (Signed)
Neurology Progress Note   S:// Brought in as a code stroke 6/3 d/t unresponsiveness, right gaze. NIH 22 on arrival. Outside window for TNK. CT/CTA negative. MRI pending. LTM EEG read negative for seizures, suggestive of profound diffuse encephalopathy.   O:// Current vital signs: BP (!) 145/76   Pulse 73   Temp 98.4 F (36.9 C) (Oral)   Resp 18   Ht 5' 1.5" (1.562 m)   Wt 68.8 kg   SpO2 94%   BMI 28.20 kg/m  Vital signs in last 24 hours: Temp:  [93.2 F (34 C)-98.4 F (36.9 C)] 98.4 F (36.9 C) (06/04 0330) Pulse Rate:  [68-129] 73 (06/04 0330) Resp:  [15-31] 18 (06/03 2225) BP: (104-171)/(59-100) 145/76 (06/04 0330) SpO2:  [89 %-100 %] 94 % (06/04 0330) Weight:  [68.8 kg] 68.8 kg (06/03 1100)  Mental status/Cognition: Lethargic, does not answer any orientation questions. Speech/language: mute, no speech. Cranial nerves:   CN II Pupils equal and reactive to light, does not blink to threat on the left.   CN III,IV,VI R gaze preference.   CN V Regards touch BL, corneals intact BL   CN VII no asymmetry, no nasolabial fold flattening    CN VIII Does not make eye contact or regard speech   CN IX & X normal palatal elevation, no uvular deviation    CN XI Head turned more to the right.   CN XII midline tongue but does not protrude on command.    Motor/sensory:  Muscle bulk: poor, tone normal BL upper ext drop to the bed when held up off the bed. Spontaneous movement in BL uppers and moves left side more than right. Withdraws BL lower ext to proximal pinch BL     Coordination/Complex Motor:  Unable to assess.  NIHSS:  1a Level of Conscious.: 1 1b LOC Questions: 2 1c LOC Commands: 2 2 Best Gaze: 1 3 Visual: 2 4 Facial Palsy: 0 5a Motor Arm - left: 2 5b Motor Arm - Right:2  6a Motor Leg - Left: 3 6b Motor Leg - Right: 3 7 Limb Ataxia: 0 8 Sensory: 0 9 Best Language: 3 10 Dysarthria: 2 11 Extinct. and Inatten.: 0 TOTAL: 23   Medications  Current  Facility-Administered Medications:    0.9 %  sodium chloride infusion, , Intravenous, Continuous, Katrinka Blazing, Rondell A, MD, Last Rate: 75 mL/hr at 07/24/22 2323, New Bag at 07/24/22 2323   acetaminophen (TYLENOL) tablet 650 mg, 650 mg, Oral, Q6H PRN **OR** acetaminophen (TYLENOL) suppository 650 mg, 650 mg, Rectal, Q6H PRN, Smith, Rondell A, MD   albuterol (PROVENTIL) (2.5 MG/3ML) 0.083% nebulizer solution 2.5 mg, 2.5 mg, Nebulization, Q6H PRN, Smith, Rondell A, MD   ceFEPIme (MAXIPIME) 2 g in sodium chloride 0.9 % 100 mL IVPB, 2 g, Intravenous, Q12H, Francena Hanly, RPH, Last Rate: 200 mL/hr at 07/25/22 0321, 2 g at 07/25/22 0321   lactated ringers infusion, , Intravenous, Continuous, Tanda Rockers A, DO, Last Rate: 150 mL/hr at 07/24/22 1846, New Bag at 07/24/22 1846   ondansetron (ZOFRAN) tablet 4 mg, 4 mg, Oral, Q6H PRN **OR** ondansetron (ZOFRAN) injection 4 mg, 4 mg, Intravenous, Q6H PRN, Smith, Rondell A, MD   sodium chloride flush (NS) 0.9 % injection 3 mL, 3 mL, Intravenous, Q12H, Smith, Rondell A, MD, 3 mL at 07/24/22 2221   sorbitol, milk of mag, mineral oil, glycerin (SMOG) enema, 300 mL, Rectal, Once, Smith, Rondell A, MD   vancomycin (VANCOREADY) IVPB 750 mg/150 mL, 750 mg, Intravenous, Q24H,  Francena Hanly, [employer] Labs CBC    Component Value Date/Time   WBC 5.6 07/25/2022 0648   RBC 3.79 (L) 07/25/2022 0648   HGB 9.7 (L) 07/25/2022 0648   HGB 11.0 (L) 08/09/2016 1507   HCT 28.4 (L) 07/25/2022 0648   HCT 33.7 (L) 08/09/2016 1507   PLT 76 (L) 07/25/2022 0648   PLT 149 08/09/2016 1507   MCV 74.9 (L) 07/25/2022 0648   MCV 74 (L) 08/09/2016 1507   MCH 25.6 (L) 07/25/2022 0648   MCHC 34.2 07/25/2022 0648   RDW 16.6 (H) 07/25/2022 0648   RDW 16.2 (H) 08/09/2016 1507   LYMPHSABS 2.4 07/24/2022 1141   LYMPHSABS 1.5 08/09/2016 1507   MONOABS 0.2 07/24/2022 1141   EOSABS 0.0 07/24/2022 1141   EOSABS 0.1 08/09/2016 1507   BASOSABS 0.1 07/24/2022 1141   BASOSABS 0.0 08/09/2016 1507     CMP     Component Value Date/Time   NA 141 07/25/2022 0648   NA 143 08/09/2016 1507   K 4.0 07/25/2022 0648   K 4.1 08/09/2016 1507   CL 108 07/25/2022 0648   CL 105 08/09/2016 1507   CO2 22 07/25/2022 0648   CO2 31 08/09/2016 1507   GLUCOSE 143 (H) 07/25/2022 0648   GLUCOSE 90 08/09/2016 1507   BUN 17 07/25/2022 0648   BUN 13 08/09/2016 1507   CREATININE 0.69 07/25/2022 0648   CREATININE 0.8 08/09/2016 1507   CALCIUM 9.2 07/25/2022 0648   CALCIUM 9.6 08/09/2016 1507   PROT 6.7 07/24/2022 1141   PROT 7.0 08/09/2016 1507   ALBUMIN 3.1 (L) 07/24/2022 1141   ALBUMIN 3.5 08/09/2016 1507   AST 40 07/24/2022 1141   AST 34 08/09/2016 1507   ALT 39 07/24/2022 1141   ALT 37 08/09/2016 1507   ALKPHOS 100 07/24/2022 1141   ALKPHOS 67 08/09/2016 1507   BILITOT 0.5 07/24/2022 1141   BILITOT 0.90 08/09/2016 1507   GFRNONAA >60 07/25/2022 0648   GFRAA >60 11/04/2019 2005     Lipid Panel     Component Value Date/Time   CHOL 160 06/10/2021 0500   TRIG 35 06/10/2021 0500   HDL 70 06/10/2021 0500   CHOLHDL 2.3 06/10/2021 0500   VLDL 7 06/10/2021 0500   LDLCALC 83 06/10/2021 0500   Labs: Ammonia, Folate: WNL. B12: Elevated 1650 Blood Cultures: Pending UA: Negative   Imaging I have reviewed images in epic and the results pertinent to this consultation are:  CT-scan of the brain: No hemorrhage or CT evidence of an acute cortical infarcts  CTA Head and Neck with Perfusion: No LVO, Severe stenosis versus occlusion of Left vertebral artery.  Severe stenosis of basilar artery.   MRI examination of the brain: Pending  LTM EEG 6/3 1336 - 6/4 0930: Initially suggestive of severe diffuse encephalopathy, nonspecific etiology.  After around 0515 on 07/25/2022, EEG appeared to worsen and was suggestive of profound diffuse encephalopathy, nonspecific etiology but could be secondary to toxic-metabolic causes. No seizures or definite epileptiform discharges were seen throughout the  recording.   Assessment: Tara Dixon is a 89 y.o. female with PMH significant for advanced alzheimers with failure to thrive, anemia, thrombocytopenia, HTN who was brought in as CODE STROKE 6/3 due to unresponsive, right gaze deviation and aphasia.  CT head without contrast with no ICH or other acute intracranial abnormalities, CTA head and neck with no LVO. Infectious workup negative so far.   Impression: Differential include focal left hemispheric seizure, potential metabolic encephalopathy, acute  stroke or just delirium.  Would expect infection, specifically meningitis to have a more subacute course over the course of a day or 2 rather than sudden worsening over few hours.   Recommendations: - MRI Brain when able - Continue infectious/Metabolic work up    Pt seen by Neuro NP/APP and later by MD. Note/plan to be edited by MD as needed.    Lynnae January, DNP, AGACNP-BC [facility] Please use AMION for contact information & EPIC for messaging.  NEUROHOSPITALIST ADDENDUM Performed a face to face diagnostic evaluation.   I have reviewed the contents of history and physical exam as documented by PA/ARNP/Resident and agree with above documentation.  I have discussed and formulated the above plan as documented. Edits to the note have been made as needed.  Impression/Key exam findings/Plan: no seizures on LTM, but seems to have worsening slowing and triphasics despite no metabolic abnormalities. Cefepime could be causing some of this but a large part of this is advanced dementia with failure to thrive and I suspect that her somnolence is likely due to delirium and somnolence.  I spoke with daughter in detail at the bedside.  Will get MRI Brain to reassure [place] and family but I have low suspicion for a large stroke given non focal exam.  Erick Blinks, MD [facility] [phone]   If 7pm to 7am, please call on call as listed on AMION.

## 2022-07-25 NOTE — Assessment & Plan Note (Signed)
Not on home diuretics, appears euvolemic to dehydrated

## 2022-07-25 NOTE — Procedures (Addendum)
Patient Name: Tara Dixon  MRN: [id number]  Epilepsy Attending: Charlsie Quest  Referring Physician/Provider: Erick Blinks, MD  Duration: 07/24/2022 1336 to 07/25/2022 1148  Patient history: 89 y.o. female with PMH significant for advanced alzheimers with failure to thrive, anemia, thrombocytopenia, HTN who was at her baseline at 5 AM this morning and then essentially unresponsive with her eyes open and bubbling in her mouth.  EMS noted right gaze deviation and aphasia and she was brought in as a code stroke. EEG to evaluate for seizure.  Level of alertness: lethargic   AEDs during EEG study: None  Technical aspects: This EEG study was done with scalp electrodes positioned according to the 10-20 International system of electrode placement. Electrical activity was reviewed with band pass filter of 1-70Hz , sensitivity of 7 uV/mm, display speed of 87mm/sec with a 60Hz  notched filter applied as appropriate. EEG data were recorded continuously and digitally stored.  Video monitoring was available and reviewed as appropriate.  Description: At the beginning of the study, EEG showed low amplitude 3 to 5 Hz theta-delta slowing.  Gradually after around 0515 on 07/25/2022, EEG showed intermittent 3 to 6 Hz theta- delta slowing lasting 3 to 5 seconds alternating with 1 to 3 seconds of generalized background attenuation.  When patient was awake/stimulated, triphasic waves were also noted.  Hyperventilation and photic stimulation were not performed.     ABNORMALITY - Continuous slow, generalized -Triphasic waves, generalized  IMPRESSION: This study was initially suggestive of severe diffuse encephalopathy, nonspecific etiology.  After around 0515 on 07/25/2022, EEG appeared to worsen and was suggestive of profound diffuse encephalopathy, nonspecific etiology but could be secondary to toxic-metabolic causes. No seizures or definite epileptiform discharges were seen throughout the recording.  Tara Dixon

## 2022-07-25 NOTE — Assessment & Plan Note (Signed)
This has been present since 2023, unclear cause.  76K today.

## 2022-07-25 NOTE — Care Plan (Signed)
LTM Mnt done on Electrode F7 and P3

## 2022-07-25 NOTE — Assessment & Plan Note (Signed)
BP controlled off medications.  

## 2022-07-26 DIAGNOSIS — Z515 Encounter for palliative care: Secondary | ICD-10-CM

## 2022-07-26 DIAGNOSIS — D696 Thrombocytopenia, unspecified: Secondary | ICD-10-CM | POA: Diagnosis not present

## 2022-07-26 DIAGNOSIS — Z7189 Other specified counseling: Secondary | ICD-10-CM | POA: Diagnosis not present

## 2022-07-26 DIAGNOSIS — D509 Iron deficiency anemia, unspecified: Secondary | ICD-10-CM

## 2022-07-26 DIAGNOSIS — Z66 Do not resuscitate: Secondary | ICD-10-CM

## 2022-07-26 DIAGNOSIS — G309 Alzheimer's disease, unspecified: Secondary | ICD-10-CM | POA: Diagnosis not present

## 2022-07-26 DIAGNOSIS — F0280 Dementia in other diseases classified elsewhere without behavioral disturbance: Secondary | ICD-10-CM

## 2022-07-26 DIAGNOSIS — G9340 Encephalopathy, unspecified: Secondary | ICD-10-CM | POA: Diagnosis not present

## 2022-07-26 LAB — GLUCOSE, CAPILLARY
Glucose-Capillary: 81 mg/dL (ref 70–99)
Glucose-Capillary: 73 mg/dL (ref 70–99)
Glucose-Capillary: 131 mg/dL — ABNORMAL HIGH (ref 70–99)
Glucose-Capillary: 98 mg/dL (ref 70–99)
Glucose-Capillary: 167 mg/dL — ABNORMAL HIGH (ref 70–99)
Glucose-Capillary: 90 mg/dL (ref 70–99)

## 2022-07-26 LAB — CBC
HCT: 26.8 % — ABNORMAL LOW (ref 36.0–46.0)
Hemoglobin: 9 g/dL — ABNORMAL LOW (ref 12.0–15.0)
MCH: 25.1 pg — ABNORMAL LOW (ref 26.0–34.0)
MCHC: 33.6 g/dL (ref 30.0–36.0)
MCV: 74.9 fL — ABNORMAL LOW (ref 80.0–100.0)
Platelets: 68 10*3/uL — ABNORMAL LOW (ref 150–400)
RBC: 3.58 MIL/uL — ABNORMAL LOW (ref 3.87–5.11)
RDW: 16.8 % — ABNORMAL HIGH (ref 11.5–15.5)
WBC: 4.2 10*3/uL (ref 4.0–10.5)
nRBC: 0.7 % — ABNORMAL HIGH (ref 0.0–0.2)

## 2022-07-26 LAB — BASIC METABOLIC PANEL
Anion gap: 8 (ref 5–15)
BUN: 14 mg/dL (ref 8–23)
CO2: 23 mmol/L (ref 22–32)
Calcium: 8.8 mg/dL — ABNORMAL LOW (ref 8.9–10.3)
Chloride: 110 mmol/L (ref 98–111)
Creatinine, Ser: 0.67 mg/dL (ref 0.44–1.00)
GFR, Estimated: >60 mL/min (ref >60)
Glucose, Bld: 80 mg/dL (ref 70–99)
Potassium: 3.4 mmol/L — ABNORMAL LOW (ref 3.5–5.1)
Sodium: 141 mmol/L (ref 135–145)

## 2022-07-26 LAB — CULTURE, BLOOD (ROUTINE X 2): Culture: NO GROWTH

## 2022-07-26 MED ORDER — POTASSIUM CHLORIDE 20 MEQ PO PACK
40.0000 meq | PACK | Freq: Once | ORAL | Status: AC
  Administered 2022-07-26: 40 meq via ORAL
  Filled 2022-07-26: qty 2

## 2022-07-26 MED ORDER — POTASSIUM CHLORIDE CRYS ER 20 MEQ PO TBCR: 40.0000 meq | EXTENDED_RELEASE_TABLET | Freq: Once | ORAL | Status: DC

## 2022-07-26 NOTE — Progress Notes (Signed)
Neurology Progress Note  Brief HPI Brought in as a code stroke 6/3 d/t unresponsiveness, right gaze. NIH 22 on arrival. Outside window for TNK. CT/CTA negative. MRI negative for acute process   S:// Patient's daughter is at the bedside.  Patient is awake and oriented to self in no apparent distress.  Daughter states she is somewhat back to baseline except she feels her speech is still off meaning that she usually says more words and it is more clear Patient was on cefepime and that has been changed to aztreonam yesterday due to concern for neurotoxicity   O:// Current vital signs: BP (!) 176/98 (BP Location: Left Arm)   Pulse 90   Temp 98.8 F (37.1 C) (Oral)   Resp 20   Ht 5' 1.5" (1.562 m)   Wt 68.8 kg   SpO2 93%   BMI 28.20 kg/m  Vital signs in last 24 hours: Temp:  [97.6 F (36.4 C)-99.2 F (37.3 C)] 98.8 F (37.1 C) (06/05 0832) Pulse Rate:  [63-90] 90 (06/05 0832) Resp:  [14-20] 20 (06/05 0832) BP: (109-176)/(63-98) 176/98 (06/05 0832) SpO2:  [93 %-97 %] 93 % (06/05 0832)  GENERAL: Awake, alert in NAD HEENT: - Normocephalic and atraumatic, dry mm LUNGS - Clear to auscultation bilaterally with no wheezes CV - S1S2 RRR, no m/r/g, equal pulses bilaterally. ABDOMEN - Soft, nontender, nondistended with normoactive BS Ext: warm, well perfused, intact peripheral pulses, mild bilateral lower extremity edema  NEURO:  Mental Status: AA&Ox 1 to self, when asked other orientation questions there is no real verbal response more just sounds Language: speech is clear when she states her name, however when asked other questions it is more like just noise rather than words.  She is unable to name objects Cranial Nerves: PERRL. EOMI-tracks examiner, visual fields full to threat bilaterally, no facial asymmetry, facial sensation intact, hearing intact, tongue/uvula/soft palate midline, normal sternocleidomastoid and trapezius muscle strength. No evidence of tongue atrophy or  fibrillations Motor: Bilateral uppers patient can lift antigravity slightly and then drop, very weak grip bilaterally.  Withdraws on bilateral lowers to noxious stimuli, is unable to lift off the bed Sensation- Intact to light touch bilaterally Coordination: Unable to assess Gait- deferred  Medications  Current Facility-Administered Medications:    0.9 %  sodium chloride infusion, , Intravenous, Continuous, [place], Earl Lites, MD, Last Rate: 100 mL/hr at 07/26/22 0800, Infusion Verify at 07/26/22 0800   acetaminophen (TYLENOL) tablet 650 mg, 650 mg, Oral, Q6H PRN **OR** acetaminophen (TYLENOL) suppository 650 mg, 650 mg, Rectal, Q6H PRN, Katrinka Blazing, Rondell A, MD   albuterol (PROVENTIL) (2.5 MG/3ML) 0.083% nebulizer solution 2.5 mg, 2.5 mg, Nebulization, Q6H PRN, Smith, Rondell A, MD   aztreonam (AZACTAM) 1 g in sodium chloride 0.9 % 100 mL IVPB, 1 g, Intravenous, Q8H, [place], Lynita Lombard, RPH, Last Rate: 200 mL/hr at 07/26/22 0549, 1 g at 07/26/22 0549   ondansetron (ZOFRAN) tablet 4 mg, 4 mg, Oral, Q6H PRN **OR** ondansetron (ZOFRAN) injection 4 mg, 4 mg, Intravenous, Q6H PRN, Smith, Rondell A, MD   potassium chloride SA (KLOR-CON M) CR tablet 40 mEq, 40 mEq, Oral, Once, Osvaldo Shipper, MD   sodium chloride flush (NS) 0.9 % injection 3 mL, 3 mL, Intravenous, Q12H, Smith, Rondell A, MD, 3 mL at 07/25/22 2150   sorbitol, milk of mag, mineral oil, glycerin (SMOG) enema, 300 mL, Rectal, Once, Smith, Rondell A, MD   vancomycin (VANCOREADY) IVPB 750 mg/150 mL, 750 mg, Intravenous, Q24H, Francena Hanly, RPH, Stopped  at 07/26/22 0000 Labs CBC    Component Value Date/Time   WBC 4.2 07/26/2022 0659   RBC 3.58 (L) 07/26/2022 0659   HGB 9.0 (L) 07/26/2022 0659   HGB 11.0 (L) 08/09/2016 1507   HCT 26.8 (L) 07/26/2022 0659   HCT 33.7 (L) 08/09/2016 1507   PLT 68 (L) 07/26/2022 0659   PLT 149 08/09/2016 1507   MCV 74.9 (L) 07/26/2022 0659   MCV 74 (L) 08/09/2016 1507   MCH 25.1 (L) 07/26/2022  0659   MCHC 33.6 07/26/2022 0659   RDW 16.8 (H) 07/26/2022 0659   RDW 16.2 (H) 08/09/2016 1507   LYMPHSABS 2.4 07/24/2022 1141   LYMPHSABS 1.5 08/09/2016 1507   MONOABS 0.2 07/24/2022 1141   EOSABS 0.0 07/24/2022 1141   EOSABS 0.1 08/09/2016 1507   BASOSABS 0.1 07/24/2022 1141   BASOSABS 0.0 08/09/2016 1507    CMP     Component Value Date/Time   NA 141 07/26/2022 0659   NA 143 08/09/2016 1507   K 3.4 (L) 07/26/2022 0659   K 4.1 08/09/2016 1507   CL 110 07/26/2022 0659   CL 105 08/09/2016 1507   CO2 23 07/26/2022 0659   CO2 31 08/09/2016 1507   GLUCOSE 80 07/26/2022 0659   GLUCOSE 90 08/09/2016 1507   BUN 14 07/26/2022 0659   BUN 13 08/09/2016 1507   CREATININE 0.67 07/26/2022 0659   CREATININE 0.8 08/09/2016 1507   CALCIUM 8.8 (L) 07/26/2022 0659   CALCIUM 9.6 08/09/2016 1507   PROT 6.7 07/24/2022 1141   PROT 7.0 08/09/2016 1507   ALBUMIN 3.1 (L) 07/24/2022 1141   ALBUMIN 3.5 08/09/2016 1507   AST 40 07/24/2022 1141   AST 34 08/09/2016 1507   ALT 39 07/24/2022 1141   ALT 37 08/09/2016 1507   ALKPHOS 100 07/24/2022 1141   ALKPHOS 67 08/09/2016 1507   BILITOT 0.5 07/24/2022 1141   BILITOT 0.90 08/09/2016 1507   GFRNONAA >60 07/26/2022 0659   GFRAA >60 11/04/2019 2005     Lipid Panel     Component Value Date/Time   CHOL 160 06/10/2021 0500   TRIG 35 06/10/2021 0500   HDL 70 06/10/2021 0500   CHOLHDL 2.3 06/10/2021 0500   VLDL 7 06/10/2021 0500   LDLCALC 83 06/10/2021 0500     Imaging I have reviewed images in epic and the results pertinent to this consultation are:  CT-scan of the brain: No hemorrhage or CT evidence of an acute cortical infarcts   CTA Head and Neck with Perfusion: No LVO, Severe stenosis versus occlusion of Left vertebral artery.  Severe stenosis of basilar artery.    MRI examination of the brain: Pending   LTM EEG 6/3 1336 - 6/4 0930: Initially suggestive of severe diffuse encephalopathy, nonspecific etiology.  After around 0515 on  07/25/2022, EEG appeared to worsen and was suggestive of profound diffuse encephalopathy, nonspecific etiology but could be secondary to toxic-metabolic causes. No seizures or definite epileptiform discharges were seen throughout the recording.   Labs Ammonia, Folate: WNL.  B12: Elevated 1650 Blood Cultures: Pending UA: Negative   Assessment:  89 y.o. female with PMH significant for advanced alzheimers with failure to thrive, anemia, thrombocytopenia, HTN who was brought in as CODE STROKE 6/3 due to unresponsive, right gaze deviation and aphasia.  CT head without contrast with no ICH or other acute intracranial abnormalities, CTA head and neck with no LVO. Infectious workup negative so far.   Impression: Differential include focal left hemispheric seizure, potential  metabolic encephalopathy, acute stroke, worsening dementia or just delirium. Would expect infection, specifically meningitis to have a more subacute course over the course of a day or 2 rather than sudden worsening over few hours.   Recommendations: - Continue infectious/Metabolic work up  -Neurology will sign off please call with questions or concerns  Gevena Mart DNP, ACNPC-AG  [facility]   NEUROHOSPITALIST ADDENDUM Performed a face to face diagnostic evaluation.   I have reviewed the contents of history and physical exam as documented by PA/ARNP/Resident and agree with above documentation.  I have discussed and formulated the above plan as documented. Edits to the note have been made as needed.  Impression/Key exam findings/Plan: more awake and much closer to her baseline. Speech is still somewhat slurred. MRI Brain yesterday with no acute abnormalities. No seizures on rEEG and LTM.  No role for AEDs at this time. Suspect worsening yesterday was due to delirium. She is high risk given advanced dementia and therefore, I would recommend minimizing hospitalization as much as we can. We will signoff, please feel  free to contact us with any questions or concerns.  Erick Blinks, MD [facility] [phone]   If 7pm to 7am, please call on call as listed on AMION.

## 2022-07-26 NOTE — Consult Note (Signed)
Consultation Note Date: 07/26/2022   Patient Name: Tara Dixon  DOB: 28-Oct-1931  MRN: [id number]  Age / Sex: 89 y.o., female  PCP: Annita Brod, MD Referring Physician: Osvaldo Shipper, MD  Reason for Consultation: Establishing goals of care  HPI/Patient Profile: 89 y.o. female  with past medical history of dementia, HTN, dCHF, and CVA admitted on 07/24/2022 with acute episode of rigidity, head held to side, and gurgling. Patient's mental status improved and no reversible etiology found. MRI brain negative. PMT consulted for GOC.   Clinical Assessment and Goals of Care: I have reviewed medical records including EPIC notes, labs and imaging, received report from RN, assessed the patient and then met with patient's daughter Inocencio Homes  to discuss diagnosis prognosis, GOC, EOL wishes, disposition and options.  Patient last seen by inpatient palliative team in 2023. Inocencio Homes and I reviewed that conversation.   I introduced Palliative Medicine as specialized medical care for people living with serious illness. It focuses on providing relief from the symptoms and stress of a serious illness. The goal is to improve quality of life for both the patient and the family.  We discuss that patient has received hospice services in the past but was discharged d/t no longer being eligible. Patient was also being followed by outpatient palliative but that has also ceased - Inocencio Homes reports it became too confusing having so many providers involved in her care and so they ended the palliative follow up.   Inocencio Homes reports at home patient remains bedbound. They get her into wheelchair with hoyer lift. She tells me patient has maintained a good appetite - eating 2 full meals a day. On review of labs albumin is 3.1   We discussed patient's current illness and what it means in the larger context of patient's on-going co-morbidities.  Natural disease trajectory and expectations at EOL  were discussed.  I attempted to elicit values and goals of care important to the patient.  Family intends for patient to return home and continue course has she had been - they are looking for extra caregivers in the home.   Discussed with daughter the importance of continued conversation with family and the medical providers regarding overall plan of care and treatment options, ensuring decisions are within the context of the patient's values and GOCs.    Hospice and Palliative Care services outpatient were explained and offered. Patient was discharged from hospice services and not sure she would qualify yet again - daughter reports she is very verbal at home and eats well, albumin 3.1. Typically dementia patients are hospice eligible when they are mostly nonverbal, nonambulatory, with albumin <2.5. Offered palliative follow up however daughter declines - became confusing last time.   Questions and concerns were addressed. The family was encouraged to call with questions or concerns.    Primary Decision Maker NEXT OF KIN - daughter Inocencio Homes    SUMMARY OF RECOMMENDATIONS   -DNR - continue current level of care - daughter hopeful for return home - PMT will follow for decline  Code Status/Advance Care Planning: DNR      Primary Diagnoses: Present on Admission:  Acute metabolic encephalopathy  Dementia with behavioral disturbance (HCC)  Hypothermia  Paroxysmal atrial fibrillation  HTN (hypertension)  Thrombocytopenia (HCC)   I have reviewed the medical record, interviewed the patient and family, and examined the patient. The following aspects are pertinent.  Past Medical History:  Diagnosis Date   Allergy    Alzheimer disease (HCC)    Anemia  Arthritis    Hypertension    Social History   Socioeconomic History   Marital status: Widowed    Spouse name: Not on file   Number of children: Not on file   Years of education: Not on file   Highest education level: Not on file   Occupational History   Not on file  Tobacco Use   Smoking status: Never   Smokeless tobacco: Never  Substance and Sexual Activity   Alcohol use: Not on file   Drug use: Not on file   Sexual activity: Not on file  Other Topics Concern   Not on file  Social History Narrative   Not on file   Social Determinants of Health   Financial Resource Strain: Not on file  Food Insecurity: No Food Insecurity (07/25/2022)   Hunger Vital Sign    Worried About Running Out of Food in the Last Year: Never true    Ran Out of Food in the Last Year: Never true  Transportation Needs: No Transportation Needs (07/25/2022)   PRAPARE - Administrator, Civil Service (Medical): No    Lack of Transportation (Non-Medical): No  Physical Activity: Not on file  Stress: Not on file  Social Connections: Not on file   Family History  Problem Relation Age of Onset   Hypertension Mother    Stroke Mother    Hypertension Father    Stroke Father    Hypertension Sister    Scheduled Meds:  potassium chloride  40 mEq Oral Once   sodium chloride flush  3 mL Intravenous Q12H   sorbitol, milk of mag, mineral oil, glycerin (SMOG) enema  300 mL Rectal Once   Continuous Infusions:  sodium chloride 100 mL/hr at 07/26/22 0939   PRN Meds:.acetaminophen **OR** acetaminophen, albuterol, ondansetron **OR** ondansetron (ZOFRAN) IV Allergies  Allergen Reactions   Ampicillin Other (See Comments)    unknown   Bactrim [Sulfamethoxazole-Trimethoprim] Other (See Comments)    unknown   Celebrex [Celecoxib] Other (See Comments)    unknown   Donepezil     Other reaction(s): intolerant   Penicillins Other (See Comments)    unknown   Polysporin [Bacitracin-Polymyxin B] Other (See Comments)    unknown   Review of Systems  Unable to perform ROS Allergic/Immunologic:       Dementia    Physical Exam Constitutional:      General: She is not in acute distress.    Appearance: She is ill-appearing.     Comments:  Sitting up eating - daughter assisting  Pulmonary:     Effort: Pulmonary effort is normal.  Skin:    General: Skin is warm and dry.  Neurological:     Mental Status: She is alert. She is disoriented.     Vital Signs: BP (!) 176/98 (BP Location: Left Arm)   Pulse 90   Temp 98.8 F (37.1 C) (Oral)   Resp 20   Ht 5' 1.5" (1.562 m)   Wt 68.8 kg   SpO2 93%   BMI 28.20 kg/m  Pain Scale: 0-10   Pain Score: 0-No pain   SpO2: SpO2: 93 % O2 Device:SpO2: 93 % O2 Flow Rate: .   IO: Intake/output summary:  Intake/Output Summary (Last 24 hours) at 07/26/2022 1150 Last data filed at 07/26/2022 0800 Gross per 24 hour  Intake 1851.55 ml  Output --  Net 1851.55 ml    LBM: Last BM Date : 07/25/22 Baseline Weight: Weight: 68.8 kg Most recent weight: Weight: 68.8  kg     Palliative Assessment/Data: PPS 30%     *Please note that this is a verbal dictation therefore any spelling or grammatical errors are due to the "[employer]" system interpretation.   Gerlean Ren, DNP, AGNP-C Palliative Medicine Team [phone] Pager: [phone]

## 2022-07-26 NOTE — Evaluation (Signed)
Clinical/Bedside Swallow Evaluation Patient Details  Name: Tara Dixon MRN: [id number] Date of Birth: 05/07/1931  Today's Date: 07/26/2022 Time: SLP Start Time (ACUTE ONLY): 0755 SLP Stop Time (ACUTE ONLY): 0820 SLP Time Calculation (min) (ACUTE ONLY): 25 min  Past Medical History:  Past Medical History:  Diagnosis Date   Allergy    Alzheimer disease (HCC)    Anemia    Arthritis    Hypertension    Past Surgical History:  Past Surgical History:  Procedure Laterality Date   ABDOMINAL HYSTERECTOMY     ROTATOR CUFF REPAIR Right    HPI:  Patient is an 89 year old admitted from current mental status, concerning for potential seizure. Past medical history positive for CVA a normal hypertension, CHF. MRI showed severe chronic microvascular ischemic disease and cerebral atrophy. Chest x-ray was negative for acute finding. And abdomen diagnostic showed retained stool. Swallow evaluation ordered. No family present at this time.    Assessment / Plan / Recommendation  Clinical Impression  Patient presents with clinical indications of oral dysphagia. At baseline pt is congested - per MD congestion is upper airway.  Patient does not directions to cough, throat clear or swallow.  Poor awareness to boluses resulted in prolonged holding - most notably with solids & single ice chip.  Use of applesauce and HAND OVER HAND assist effective to facilitate oral transit of solids.  Swallow can be significantly delayed and will need to assure observe laryngeal elevation to assut pt swallows prior to giving more - if indicated oral suctioning can be used.  SLP suspects this is more of a oral component to dysphagia and pharyngeal ability is intact.  She will be at elevated aspiration risk due to her mentation, reliance on others for feeding, bedridden status and weak congested cough. No family present to establish baseline, SLP will follow up for dysphagia management - most importantly family education and  establishment of baseline function. SLP Visit Diagnosis: Dysphagia, oral phase (R13.11)    Aspiration Risk  Moderate aspiration risk    Diet Recommendation Dysphagia 3 (Mech soft);Thin liquid   Liquid Administration via: Cup;No straw;Spoon Medication Administration: Other (Comment) (with puree) Compensations: Minimize environmental distractions;Slow rate;Small sips/bites Postural Changes: Seated upright at 90 degrees;Remain upright for at least 30 minutes after po intake    Other  Recommendations Oral Care Recommendations: Oral care BID    Recommendations for follow up therapy are one component of a multi-disciplinary discharge planning process, led by the attending physician.  Recommendations may be updated based on patient status, additional functional criteria and insurance authorization.  Follow up Recommendations    TBD    Assistance Recommended at Discharge  TBD  Functional Status Assessment Patient has had a recent decline in their functional status and demonstrates the ability to make significant improvements in function in a reasonable and predictable amount of time.  Frequency and Duration min 1 x/week  1 week       Prognosis Prognosis for improved oropharyngeal function: Guarded Barriers to Reach Goals: Cognitive deficits      Swallow Study   General Date of Onset: 07/26/22 HPI: Patient is an 89 year old admitted from current mental status, concerning for potential seizure. Past medical history positive for CVA a normal hypertension, CHF. MRI showed severe chronic microvascular ischemic disease and cerebral atrophy. Chest x-ray was negative for acute finding. And abdomen diagnostic showed retained stool. Swallow evaluation ordered. No family present at this time. Type of Study: Bedside Swallow Evaluation Diet Prior to this Study: NPO Temperature  Spikes Noted: No Respiratory Status: Room air History of Recent Intubation: No Behavior/Cognition: Alert;Doesn't follow  directions Oral Cavity Assessment: Dry Oral Care Completed by SLP: Yes Oral Cavity - Dentition: Adequate natural dentition Vision:  (Uncertain) Self-Feeding Abilities: Total assist Patient Positioning: Upright in bed Baseline Vocal Quality: Other (comment) (congested) Volitional Cough: Cognitively unable to elicit Volitional Swallow: Unable to elicit    Oral/Motor/Sensory Function Overall Oral Motor/Sensory Function: Other (comment) (No focal cranial nerve deficits-from tasks patient completed)   Ice Chips Ice chips: Impaired Presentation: Spoon Oral Phase Impairments: Poor awareness of bolus;Reduced labial seal;Reduced lingual movement/coordination Oral Phase Functional Implications: Oral holding   Thin Liquid Thin Liquid: Impaired Presentation: Self Fed;Straw;Cup;Spoon Oral Phase Impairments: Reduced labial seal Oral Phase Functional Implications: Left anterior spillage;Prolonged oral transit    Nectar Thick Nectar Thick Liquid: Impaired Presentation: Cup;Straw Oral Phase Impairments: Reduced labial seal   Honey Thick Honey Thick Liquid: Not tested   Puree Puree: Impaired Presentation: Spoon Oral Phase Impairments: Reduced labial seal;Reduced lingual movement/coordination Oral Phase Functional Implications: Prolonged oral transit   Solid     Solid: Impaired Oral Phase Impairments: Reduced labial seal;Reduced lingual movement/coordination;Impaired mastication;Poor awareness of bolus Oral Phase Functional Implications: Oral holding;Other (comment) (anterior sulci retention - needed applesauce to clear)      Chales Abrahams 07/26/2022,8:38 AM  Rolena Infante, MS [facility] SLP Acute Rehab Services Office [phone]

## 2022-07-26 NOTE — Progress Notes (Signed)
[facility] PROGRESS NOTE   Tara Dixon WUJ:[id number] DOB: 1931-05-27 DOA: 07/24/2022  PCP: Annita Brod, MD  Brief History/Interval Summary: 89 y.o. F with dementia, lives at home, HTN, dCHF, and hx CVA who presented with acute episode of rigidity, head held to side, and gurgling.  Seen by neurology.  EEG and MRI brain was recommended.  Patient was hospitalized for further management.  Consultants: Neurology  Procedures: EEG    Subjective/Interval History: Patient pleasantly confused.  Not able to answer questions appropriately.    Assessment/Plan:  Acute metabolic encephalopathy At baseline patient has advanced dementia.  She was initially obtunded and poorly responsive.  Seems to have improved. No reversible etiology has been found.  MRI brain was negative.  TSH folate B12 ammonia levels were all normal. EEG did not show any seizures.  Echocardiogram shows normal systolic function. Cefepime could have contributed as well.  Currently on aztreonam. Palliative care was also consulted.  Oropharyngeal dysphagia Seen by speech therapy Started on dysphagia 3 diet.  Hypothermia/concern for SIRS Unclear etiology.  No infectious etiology noted.  Cultures negative so far.  Was empirically started on broad-spectrum antibiotics which can be discontinued. She is afebrile.  WBC is normal.  Paroxysmal atrial fibrillation This is new.  Was transiently noted on telemetry.  Currently in sinus rhythm. Likely not a good candidate for anticoagulation considering her advanced dementia. TSH was normal.  Echocardiogram shows normal systolic function of the left ventricle with grade 1 diastolic dysfunction.  No significant valvular abnormalities noted.  Thrombocytopenia Platelet counts are low but stable.  No evidence of bleeding.  Hypokalemia  Will be repleted.  Dementia with behavioral disturbances Seems to be more awake today compared to the last few days. Noted to be on  Seroquel and haloperidol prior to admission.  Diastolic CHF Stable.  History of stroke. No acute stroke noted on MRI.  Essential hypertension Not noted to be on any antihypertensives prior to admission. Blood pressure has been uptrending over the last 24 hours.  Continue to monitor for now.  Microcytic anemia Hemoglobin is low but stable.  No evidence of overt blood loss.  Vitamin B12 and folic acid levels were not deficient.  Will check iron levels as well.   DVT Prophylaxis: SCDs Code Status: DNR Family Communication: No family at bedside Disposition Plan: To be determined      Medications: Scheduled:  potassium chloride  40 mEq Oral Once   sodium chloride flush  3 mL Intravenous Q12H   sorbitol, milk of mag, mineral oil, glycerin (SMOG) enema  300 mL Rectal Once   Continuous:  sodium chloride 100 mL/hr at 07/26/22 0939   aztreonam 1 g (07/26/22 0549)   vancomycin Stopped (07/26/22 0000)   NFA:OZHYQMVHQIONG **OR** acetaminophen, albuterol, ondansetron **OR** ondansetron (ZOFRAN) IV  Antibiotics: Anti-infectives (From admission, onward)    Start     Dose/Rate Route Frequency Ordered Stop   07/25/22 2200  vancomycin (VANCOREADY) IVPB 750 mg/150 mL        750 mg 150 mL/hr over 60 Minutes Intravenous Every 24 hours 07/24/22 1542     07/25/22 1430  aztreonam (AZACTAM) 1 g in sodium chloride 0.9 % 100 mL IVPB        1 g 200 mL/hr over 30 Minutes Intravenous Every 8 hours 07/25/22 1336     07/25/22 0330  ceFEPIme (MAXIPIME) 2 g in sodium chloride 0.9 % 100 mL IVPB  Status:  Discontinued        2 g 200  mL/hr over 30 Minutes Intravenous Every 12 hours 07/24/22 1542 07/25/22 1217   07/24/22 1545  ceFEPIme (MAXIPIME) 2 g in sodium chloride 0.9 % 100 mL IVPB        2 g 200 mL/hr over 30 Minutes Intravenous  Once 07/24/22 1534 07/24/22 1934   07/24/22 1545  metroNIDAZOLE (FLAGYL) IVPB 500 mg        500 mg 100 mL/hr over 60 Minutes Intravenous  Once 07/24/22 1534 07/24/22  2146   07/24/22 1545  vancomycin (VANCOCIN) IVPB 1000 mg/200 mL premix        1,000 mg 200 mL/hr over 60 Minutes Intravenous  Once 07/24/22 1534 07/24/22 2321       Objective:  Vital Signs  Vitals:   07/25/22 2332 07/26/22 0410 07/26/22 0823 07/26/22 0832  BP: (!) 147/79 (!) 157/90 (!) 156/84 (!) 176/98  Pulse: 63 76 72 90  Resp: 19 20 20 20   Temp: 97.8 F (36.6 C) 98 F (36.7 C) 97.6 F (36.4 C) 98.8 F (37.1 C)  TempSrc: Oral Oral Oral Oral  SpO2: 95% 97% 94% 93%  Weight:      Height:        Intake/Output Summary (Last 24 hours) at 07/26/2022 1035 Last data filed at 07/26/2022 0800 Gross per 24 hour  Intake 1926.6 ml  Output --  Net 1926.6 ml   Filed Weights   07/24/22 1100  Weight: 68.8 kg    General appearance: Awake alert.  In no distress.  Pleasantly confused Resp: Clear to auscultation bilaterally.  Normal effort Cardio: S1-S2 is normal regular.  No S3-S4.  No rubs murmurs or bruit GI: Abdomen is soft.  Nontender nondistended.  Bowel sounds are present normal.  No masses organomegaly Extremities: No edema.  No obvious focal neurological deficits noted.  Lab Results:  Data Reviewed: I have personally reviewed following labs and reports of the imaging studies  CBC: Recent Labs  Lab 07/24/22 1141 07/24/22 1144 07/24/22 1451 07/25/22 0648 07/26/22 0659  WBC 3.8*  --   --  5.6 4.2  NEUTROABS 1.1*  --   --   --   --   HGB 10.4* 11.9* 11.2* 9.7* 9.0*  HCT 32.9* 35.0* 33.0* 28.4* 26.8*  MCV 76.2*  --   --  74.9* 74.9*  PLT 81*  --   --  76* 68*    Basic Metabolic Panel: Recent Labs  Lab 07/24/22 1141 07/24/22 1144 07/24/22 1451 07/25/22 0648 07/26/22 0659  NA 141 141 140 141 141  K 3.9 3.9 4.4 4.0 3.4*  CL 107 107  --  108 110  CO2 24  --   --  22 23  GLUCOSE 103* 102*  --  143* 80  BUN 21 22  --  17 14  CREATININE 0.71 0.50  --  0.69 0.67  CALCIUM 9.6  --   --  9.2 8.8*  MG  --   --   --  1.9  --     GFR: Estimated Creatinine  Clearance: 43.7 mL/min (by C-G formula based on SCr of 0.67 mg/dL).  Liver Function Tests: Recent Labs  Lab 07/24/22 1141  AST 40  ALT 39  ALKPHOS 100  BILITOT 0.5  PROT 6.7  ALBUMIN 3.1*     Recent Labs  Lab 07/24/22 1450 07/24/22 1750  AMMONIA 16 25    Coagulation Profile: Recent Labs  Lab 07/24/22 1141  INR 1.0     CBG: Recent Labs  Lab 07/25/22 1540 07/25/22  1954 07/25/22 2329 07/26/22 0406 07/26/22 0818  GLUCAP 103* 96 88 81 73    Thyroid Function Tests: Recent Labs    07/24/22 1450  TSH 3.455  FREET4 0.94  T3FREE 2.3    Anemia Panel: Recent Labs    07/24/22 1450 07/24/22 1750  VITAMINB12 1,793* 1,650*  FOLATE 24.4 23.9    Recent Results (from the past 240 hour(s))  Blood culture (routine x 2)     Status: None (Preliminary result)   Collection Time: 07/24/22  3:34 PM   Specimen: BLOOD LEFT HAND  Result Value Ref Range Status   Specimen Description BLOOD LEFT HAND  Final   Special Requests   Final    BOTTLES DRAWN AEROBIC ONLY Blood Culture results may not be optimal due to an inadequate volume of blood received in culture bottles   Culture   Final    NO GROWTH < 24 HOURS Performed at [facility], [address], [place], [place] [address]    Report Status PENDING  Incomplete  Blood culture (routine x 2)     Status: None (Preliminary result)   Collection Time: 07/24/22  3:39 PM   Specimen: BLOOD RIGHT HAND  Result Value Ref Range Status   Specimen Description BLOOD RIGHT HAND  Final   Special Requests   Final    BOTTLES DRAWN AEROBIC AND ANAEROBIC Blood Culture results may not be optimal due to an excessive volume of blood received in culture bottles   Culture   Final    NO GROWTH < 24 HOURS Performed at [facility], [address] [place], [place] [address]    Report Status PENDING  Incomplete      Radiology Studies: MR BRAIN WO CONTRAST  Result Date: 07/25/2022 CLINICAL DATA:  Neuro deficit, acute, stroke  suspected EXAM: MRI HEAD WITHOUT CONTRAST TECHNIQUE: Multiplanar, multiecho pulse sequences of the brain and surrounding structures were obtained without intravenous contrast. COMPARISON:  CT head July 24, 2022. FINDINGS: Motion limited study. Brain: No acute infarction, hemorrhage, hydrocephalus, extra-axial collection or mass lesion. Extensive confluent T2/FLAIR E per intensities within the white matter, compatible with chronic microvascular ischemic disease. Cerebral atrophy. Vascular: Major arterial flow voids are maintained skull base. Skull and upper cervical spine: Normal marrow signal. Sinuses/Orbits: Clear sinuses.  No acute orbital findings. Other: No mastoid effusions. IMPRESSION: 1. No evidence of acute intracranial abnormality. 2. Severe chronic microvascular ischemic disease and cerebral atrophy (ICD10-G31.9). Electronically Signed   By: Feliberto Harts M.D.   On: 07/25/2022 13:57   ECHOCARDIOGRAM COMPLETE  Result Date: 07/25/2022    ECHOCARDIOGRAM REPORT   Patient Name:   Tara Dixon Date of Exam: 07/25/2022 Medical Rec #:  [id number]     Height:       61.5 in Accession #:    [id number]    Weight:       151.7 lb Date of Birth:  [birth year]     BSA:          1.690 m Patient Age:    89 years      BP:           117/70 mmHg Patient Gender: F             HR:           71 bpm. Exam Location:  Inpatient Procedure: 2D Echo, Cardiac Doppler and Color Doppler Indications:    Atrial Fibrillation I48.91  History:        Patient has  no prior history of Echocardiogram examinations.                 Arrythmias:Atrial Fibrillation, Signs/Symptoms:Altered Mental                 Status; Risk Factors:Hypertension and Non-Smoker.  Sonographer:    Aron Baba Referring Phys: [id number] RONDELL A SMITH  Sonographer Comments: Image acquisition challenging due to uncooperative patient. IMPRESSIONS  1. Left ventricular ejection fraction, by estimation, is 60 to 65%. The left ventricle has normal function. Left ventricular  endocardial border not optimally defined to evaluate regional wall motion. Left ventricular diastolic parameters are consistent with Grade I diastolic dysfunction (impaired relaxation).  2. Right ventricular systolic function is normal. The right ventricular size is normal.  3. Left atrial size was mildly dilated.  4. The mitral valve is normal in structure. No evidence of mitral valve regurgitation. No evidence of mitral stenosis.  5. The aortic valve was not well visualized. Aortic valve regurgitation is not visualized. No aortic stenosis is present.  6. Technically limited study due to poor sound wave transmission. FINDINGS  Left Ventricle: Left ventricular ejection fraction, by estimation, is 60 to 65%. The left ventricle has normal function. Left ventricular endocardial border not optimally defined to evaluate regional wall motion. The left ventricular internal cavity size was normal in size. There is no left ventricular hypertrophy. Left ventricular diastolic parameters are consistent with Grade I diastolic dysfunction (impaired relaxation). Right Ventricle: The right ventricular size is normal. No increase in right ventricular wall thickness. Right ventricular systolic function is normal. Left Atrium: Left atrial size was mildly dilated. Right Atrium: Right atrial size was normal in size. Pericardium: There is no evidence of pericardial effusion. Mitral Valve: The mitral valve is normal in structure. No evidence of mitral valve regurgitation. No evidence of mitral valve stenosis. Tricuspid Valve: The tricuspid valve is normal in structure. Tricuspid valve regurgitation is trivial. No evidence of tricuspid stenosis. Aortic Valve: The aortic valve was not well visualized. Aortic valve regurgitation is not visualized. No aortic stenosis is present. Pulmonic Valve: The pulmonic valve was not well visualized. Pulmonic valve regurgitation is not visualized. No evidence of pulmonic stenosis. Aorta: The aortic root  is normal in size and structure. Venous: The inferior vena cava was not well visualized. IAS/Shunts: No atrial level shunt detected by color flow Doppler.  LEFT VENTRICLE PLAX 2D LVIDd:         3.80 cm   Diastology LVIDs:         2.40 cm   LV e' medial:    4.05 cm/s LV PW:         0.60 cm   LV E/e' medial:  10.3 LV IVS:        0.90 cm   LV e' lateral:   8.10 cm/s LVOT diam:     1.50 cm   LV E/e' lateral: 5.1 LV SV:         25 LV SV Index:   15 LVOT Area:     1.77 cm  RIGHT VENTRICLE RV S prime:     12.80 cm/s TAPSE (M-mode): 1.6 cm LEFT ATRIUM           Index        RIGHT ATRIUM           Index LA diam:      2.60 cm 1.54 cm/m   RA Area:     11.00 cm LA Vol (A4C): 21.6 ml 12.78 ml/m  RA Volume:   25.20 ml  14.92 ml/m  AORTIC VALVE LVOT Vmax:   71.30 cm/s LVOT Vmean:  43.500 cm/s LVOT VTI:    0.142 m  AORTA Ao Root diam: 3.30 cm MITRAL VALVE MV Area (PHT): 3.31 cm    SHUNTS MV Decel Time: 229 msec    Systemic VTI:  0.14 m MV E velocity: 41.60 cm/s  Systemic Diam: 1.50 cm MV A velocity: 73.80 cm/s MV E/A ratio:  0.56 Arvilla Meres MD Electronically signed by Arvilla Meres MD Signature Date/Time: 07/25/2022/10:13:30 AM    Final    Overnight EEG with video  Result Date: 07/25/2022 Charlsie Quest, MD     07/26/2022 10:27 AM Patient Name: Tara Dixon MRN: [id number] Epilepsy Attending: Charlsie Quest Referring Physician/Provider: Erick Blinks, MD Duration: 07/24/2022 1336 to 07/25/2022 1148 Patient history: 89 y.o. female with PMH significant for advanced alzheimers with failure to thrive, anemia, thrombocytopenia, HTN who was at her baseline at 5 AM this morning and then essentially unresponsive with her eyes open and bubbling in her mouth.  EMS noted right gaze deviation and aphasia and she was brought in as a code stroke. EEG to evaluate for seizure. Level of alertness: lethargic AEDs during EEG study: None Technical aspects: This EEG study was done with scalp electrodes positioned according to the  10-20 International system of electrode placement. Electrical activity was reviewed with band pass filter of 1-70Hz , sensitivity of 7 uV/mm, display speed of 44mm/sec with a 60Hz  notched filter applied as appropriate. EEG data were recorded continuously and digitally stored.  Video monitoring was available and reviewed as appropriate. Description: At the beginning of the study, EEG showed low amplitude 3 to 5 Hz theta-delta slowing.  Gradually after around 0515 on 07/25/2022, EEG showed intermittent 3 to 6 Hz theta- delta slowing lasting 3 to 5 seconds alternating with 1 to 3 seconds of generalized background attenuation.  When patient was awake/stimulated, triphasic waves were also noted.  Hyperventilation and photic stimulation were not performed.   ABNORMALITY - Continuous slow, generalized -Triphasic waves, generalized IMPRESSION: This study was initially suggestive of severe diffuse encephalopathy, nonspecific etiology.  After around 0515 on 07/25/2022, EEG appeared to worsen and was suggestive of profound diffuse encephalopathy, nonspecific etiology but could be secondary to toxic-metabolic causes. No seizures or definite epileptiform discharges were seen throughout the recording. Charlsie Quest   DG Abd Portable 1V  Result Date: 07/24/2022 CLINICAL DATA:  Constipation EXAM: PORTABLE ABDOMEN - 1 VIEW COMPARISON:  X-ray 05/21/2021 FINDINGS: Limited x-ray. Portable supine and severely rotated to the right. There is presumed contrast in the urinary bladder and renal collecting systems proximally. Please correlate with previous CT angiogram of the head and neck from earlier 07/24/2022. Scattered colonic stool identified. Gas is seen in nondilated loops of small and large bowel. The right hemi abdominal edge is clipped off the edge of the film. Prominent degenerative changes of the spine. IMPRESSION: Very limited x-ray. Nonspecific bowel gas pattern with scattered colonic stool. Electronically Signed   By: Karen Kays M.D.   On: 07/24/2022 17:47   DG Chest Portable 1 View  Result Date: 07/24/2022 CLINICAL DATA:  Altered mental status EXAM: PORTABLE CHEST 1 VIEW COMPARISON:  Chest radiograph dated 06/09/2021 FINDINGS: Low lung volumes. No focal consolidations. No pleural effusion or pneumothorax. Similar enlarged cardiomediastinal silhouette. No acute osseous abnormality. IMPRESSION: Low lung volumes without focal consolidation. Electronically Signed   By: Agustin Cree M.D.   On: 07/24/2022 14:50  CT ANGIO HEAD NECK W WO CM W PERF (CODE STROKE)  Result Date: 07/24/2022 CLINICAL DATA:  Neuro deficit, acute, stroke suspected EXAM: EXAM CT ANGIOGRAPHY HEAD AND NECK WITH AND WITHOUT CONTRAST TECHNIQUE: TECHNIQUE Multidetector CT imaging of the head and neck was performed using the standard protocol during bolus administration of intravenous contrast. Multiplanar CT image reconstructions and MIPs were obtained to evaluate the vascular anatomy. Carotid stenosis measurements (when applicable) are obtained utilizing NASCET criteria, using the distal internal carotid diameter as the denominator. RADIATION DOSE REDUCTION: This exam was performed according to the departmental dose-optimization program which includes automated exposure control, adjustment of the mA and/or kV according to patient size and/or use of iterative reconstruction technique. COMPARISON:  CT head from the same day. FINDINGS: CTA NECK: Aorta: Great vessel origins are patent without significant stenosis. Right carotid system: Patent without significant (greater than 50%) stenosis. Left carotid system: Patent without significant (greater than 50%) stenosis. Vertebral arteries: Right dominant. Both vertebral arteries are patent in the neck without greater than 50% stenosis. Neck: No acute abnormality on limited assessment. Osseous: No acute abnormality on limited assessment. Severe multilevel degenerative change including bulky osteophytes and multilevel  ankylosis. CTA HEAD: Anterior circulation: Bilateral intracranial ICAs, MCAs, and ACAs are patent without proximal hemodynamically significant stenosis. Posterior circulation: Severe stenosis versus occlusion of the small/non dominant intradural vertebral artery at its dural margin. Reconstitution distally of a small intradural vertebral artery. Right intradural vertebral artery is patent. Severe stenosis of the mid basilar artery. Prominent bilateral posterior communicating arteries with small distal vertebrobasilar system, anatomic variant. Both posterior cerebral arteries are patent without proximal hemodynamically significant stenosis. IMPRESSION: 1. No emergent large vessel occlusion. 2. Severe stenosis versus occlusion of a diminutive nondominant left intradural vertebral artery is dural margin with reconstitution distally. 3. Severe stenosis of the basilar artery. Electronically Signed   By: Feliberto Harts M.D.   On: 07/24/2022 12:30   CT HEAD CODE STROKE WO CONTRAST  Result Date: 07/24/2022 CLINICAL DATA:  Code stroke. Neuro deficit, acute, stroke suspected no lateralizing signs provided. EXAM: CT HEAD WITHOUT CONTRAST TECHNIQUE: Contiguous axial images were obtained from the base of the skull through the vertex without intravenous contrast. RADIATION DOSE REDUCTION: This exam was performed according to the departmental dose-optimization program which includes automated exposure control, adjustment of the mA and/or kV according to patient size and/or use of iterative reconstruction technique. COMPARISON:  MR Brain 06/09/21 FINDINGS: Brain: No evidence of acute infarction, hemorrhage, hydrocephalus, extra-axial collection or mass lesion/mass effect. Sequela of severe chronic microvascular ischemic change. Generalized volume loss. Vascular: No hyperdense vessel or unexpected calcification. Skull: Mild soft tissue thickening along the right parietal scalp Sinuses/Orbits: No middle ear or mastoid effusion.  Paranasal sinuses are clear. Orbits are unremarkable. Other: None. ASPECTS [facility] Stroke Program Early CT Score): 10 when accounting for chronic findings IMPRESSION: No hemorrhage or CT evidence of an acute cortical infarcts. Aspects is 10 when accounting for chronic findings. Findings were paged to Dr. Derry Lory on 07/24/2022 at 11:55 a.m. via [facility] paging system. Electronically Signed   By: Lorenza Cambridge M.D.   On: 07/24/2022 11:56       LOS: 1 day   Tara Dixon  [facility] Pager on [email or web address]  07/26/2022, 10:35 AM

## 2022-07-27 DIAGNOSIS — G9340 Encephalopathy, unspecified: Secondary | ICD-10-CM | POA: Diagnosis not present

## 2022-07-27 DIAGNOSIS — G309 Alzheimer's disease, unspecified: Secondary | ICD-10-CM | POA: Diagnosis not present

## 2022-07-27 DIAGNOSIS — F0280 Dementia in other diseases classified elsewhere without behavioral disturbance: Secondary | ICD-10-CM | POA: Diagnosis not present

## 2022-07-27 LAB — BASIC METABOLIC PANEL
Anion gap: 7 (ref 5–15)
BUN: 8 mg/dL (ref 8–23)
CO2: 21 mmol/L — ABNORMAL LOW (ref 22–32)
Calcium: 8.9 mg/dL (ref 8.9–10.3)
Chloride: 111 mmol/L (ref 98–111)
Creatinine, Ser: 0.65 mg/dL (ref 0.44–1.00)
GFR, Estimated: >60 mL/min (ref >60)
Glucose, Bld: 124 mg/dL — ABNORMAL HIGH (ref 70–99)
Potassium: 3.4 mmol/L — ABNORMAL LOW (ref 3.5–5.1)
Sodium: 139 mmol/L (ref 135–145)

## 2022-07-27 LAB — GLUCOSE, CAPILLARY
Glucose-Capillary: 103 mg/dL — ABNORMAL HIGH (ref 70–99)
Glucose-Capillary: 182 mg/dL — ABNORMAL HIGH (ref 70–99)
Glucose-Capillary: 81 mg/dL (ref 70–99)
Glucose-Capillary: 96 mg/dL (ref 70–99)
Glucose-Capillary: 98 mg/dL (ref 70–99)

## 2022-07-27 LAB — CBC
HCT: 27.7 % — ABNORMAL LOW (ref 36.0–46.0)
Hemoglobin: 9.2 g/dL — ABNORMAL LOW (ref 12.0–15.0)
MCH: 24.4 pg — ABNORMAL LOW (ref 26.0–34.0)
MCHC: 33.2 g/dL (ref 30.0–36.0)
MCV: 73.5 fL — ABNORMAL LOW (ref 80.0–100.0)
Platelets: 70 10*3/uL — ABNORMAL LOW (ref 150–400)
RBC: 3.77 MIL/uL — ABNORMAL LOW (ref 3.87–5.11)
RDW: 16.7 % — ABNORMAL HIGH (ref 11.5–15.5)
WBC: 3.2 10*3/uL — ABNORMAL LOW (ref 4.0–10.5)
nRBC: 1.5 % — ABNORMAL HIGH (ref 0.0–0.2)

## 2022-07-27 LAB — CULTURE, BLOOD (ROUTINE X 2): Culture: NO GROWTH

## 2022-07-27 LAB — FERRITIN: Ferritin: 191 ng/mL (ref 11–307)

## 2022-07-27 LAB — IRON AND TIBC
Iron: 58 ug/dL (ref 28–170)
Saturation Ratios: 22 % (ref 10.4–31.8)
TIBC: 262 ug/dL (ref 250–450)
UIBC: 204 ug/dL

## 2022-07-27 LAB — RETICULOCYTES
Immature Retic Fract: 25.5 % — ABNORMAL HIGH (ref 2.3–15.9)
RBC.: 3.78 MIL/uL — ABNORMAL LOW (ref 3.87–5.11)
Retic Count, Absolute: 33.3 10*3/uL (ref 19.0–186.0)
Retic Ct Pct: 0.9 % (ref 0.4–3.1)

## 2022-07-27 MED ORDER — SCOPOLAMINE 1 MG/3DAYS TD PT72
1.0000 | MEDICATED_PATCH | TRANSDERMAL | Status: DC
  Administered 2022-07-27: 1.5 mg via TRANSDERMAL
  Filled 2022-07-27: qty 1

## 2022-07-27 MED ORDER — POTASSIUM CHLORIDE 20 MEQ PO PACK
40.0000 meq | PACK | Freq: Once | ORAL | Status: AC
  Administered 2022-07-27: 40 meq via ORAL
  Filled 2022-07-27: qty 2

## 2022-07-27 NOTE — Progress Notes (Signed)
[facility] PROGRESS NOTE   Tara Dixon ZOX:[id number] DOB: 1932-01-05 DOA: 07/24/2022  PCP: Annita Brod, MD  Brief History/Interval Summary: 89 y.o. F with dementia, lives at home, HTN, dCHF, and hx CVA who presented with acute episode of rigidity, head held to side, and gurgling.  Seen by neurology.  EEG and MRI brain was recommended.  Patient was hospitalized for further management.  Consultants: Neurology  Procedures: EEG    Subjective/Interval History: Patient noted to be awake and alert this morning.  Working with the speech therapist.    Assessment/Plan:  Acute metabolic encephalopathy At baseline patient has advanced dementia.  She was initially obtunded and poorly responsive.  Mentation has improved.  Could be close to her baseline. No reversible etiology has been found.  MRI brain was negative.  TSH folate B12 ammonia levels were all normal. EEG did not show any seizures.  Echocardiogram shows normal systolic function. Cefepime could have contributed as well.  Antibiotics were discontinued yesterday.  Palliative care was consulted.  Oropharyngeal dysphagia Seen by speech therapy Started on dysphagia 3 diet. Upper airway congestion is noted.  Chest x-ray did not show any acute findings.  Hypothermia/concern for SIRS Unclear etiology.  No infectious etiology noted.  Cultures negative so far.  Was empirically started on broad-spectrum antibiotics which was discontinued yesterday.  Remains afebrile.  Paroxysmal atrial fibrillation This is new.  Was transiently noted on telemetry.  Currently in sinus rhythm. Considering her thrombocytopenia and advanced dementia, she is not a good candidate for anticoagulation.   TSH was normal.  Echocardiogram shows normal systolic function of the left ventricle with grade 1 diastolic dysfunction.  No significant valvular abnormalities noted.  Thrombocytopenia Platelet counts are low but stable.  No evidence of  bleeding.  Hypokalemia  Monitor.  Dementia with behavioral disturbances Noted to be on Seroquel and haloperidol prior to admission.  Seems to be close to baseline now.  Diastolic CHF Stable.  History of stroke. No acute stroke noted on MRI.  Essential hypertension Not noted to be on any antihypertensives prior to admission. Occasional high readings noted.  Will not be too aggressive in treating the BP considering her advanced age and dementia.  Microcytic anemia Hemoglobin is low but stable.  No evidence of overt blood loss.  Vitamin B12 and folic acid levels were not deficient.  Iron levels are pending.   DVT Prophylaxis: SCDs Code Status: DNR Family Communication: No family at bedside.  Discussed with patient's daughter over the phone. Disposition Plan: Hopefully return home later today or tomorrow.     Medications: Scheduled:  potassium chloride  40 mEq Oral Once   sodium chloride flush  3 mL Intravenous Q12H   sorbitol, milk of mag, mineral oil, glycerin (SMOG) enema  300 mL Rectal Once   Continuous:   WJX:BJYNWGNFAOZHY **OR** acetaminophen, albuterol, ondansetron **OR** ondansetron (ZOFRAN) IV  Antibiotics: Anti-infectives (From admission, onward)    Start     Dose/Rate Route Frequency Ordered Stop   07/25/22 2200  vancomycin (VANCOREADY) IVPB 750 mg/150 mL  Status:  Discontinued        750 mg 150 mL/hr over 60 Minutes Intravenous Every 24 hours 07/24/22 1542 07/26/22 1045   07/25/22 1430  aztreonam (AZACTAM) 1 g in sodium chloride 0.9 % 100 mL IVPB  Status:  Discontinued        1 g 200 mL/hr over 30 Minutes Intravenous Every 8 hours 07/25/22 1336 07/26/22 1045   07/25/22 0330  ceFEPIme (MAXIPIME) 2 g in  sodium chloride 0.9 % 100 mL IVPB  Status:  Discontinued        2 g 200 mL/hr over 30 Minutes Intravenous Every 12 hours 07/24/22 1542 07/25/22 1217   07/24/22 1545  ceFEPIme (MAXIPIME) 2 g in sodium chloride 0.9 % 100 mL IVPB        2 g 200 mL/hr over 30  Minutes Intravenous  Once 07/24/22 1534 07/24/22 1934   07/24/22 1545  metroNIDAZOLE (FLAGYL) IVPB 500 mg        500 mg 100 mL/hr over 60 Minutes Intravenous  Once 07/24/22 1534 07/24/22 2146   07/24/22 1545  vancomycin (VANCOCIN) IVPB 1000 mg/200 mL premix        1,000 mg 200 mL/hr over 60 Minutes Intravenous  Once 07/24/22 1534 07/24/22 2321       Objective:  Vital Signs  Vitals:   07/26/22 1919 07/26/22 2324 07/27/22 0337 07/27/22 0810  BP: (!) 155/90 (!) 156/103 (!) 145/91 (!) 154/93  Pulse: 80 78 77 77  Resp: 16 19 18 18   Temp: 97.7 F (36.5 C) 99.2 F (37.3 C) 99 F (37.2 C) 98.7 F (37.1 C)  TempSrc: Oral Oral Oral Oral  SpO2: 98% 96% 94% 94%  Weight:      Height:        Intake/Output Summary (Last 24 hours) at 07/27/2022 1046 Last data filed at 07/27/2022 0500 Gross per 24 hour  Intake 1062.55 ml  Output 1300 ml  Net -237.45 ml    Filed Weights   07/24/22 1100  Weight: 68.8 kg    General appearance: Awake alert.  In no distress.  Distracted Resp: Clear to auscultation bilaterally.  Normal effort Cardio: S1-S2 is normal regular.  No S3-S4.  No rubs murmurs or bruit GI: Abdomen is soft.  Nontender nondistended.  Bowel sounds are present normal.  No masses organomegaly   Lab Results:  Data Reviewed: I have personally reviewed following labs and reports of the imaging studies  CBC: Recent Labs  Lab 07/24/22 1141 07/24/22 1144 07/24/22 1451 07/25/22 0648 07/26/22 0659 07/27/22 0914  WBC 3.8*  --   --  5.6 4.2 3.2*  NEUTROABS 1.1*  --   --   --   --   --   HGB 10.4* 11.9* 11.2* 9.7* 9.0* 9.2*  HCT 32.9* 35.0* 33.0* 28.4* 26.8* 27.7*  MCV 76.2*  --   --  74.9* 74.9* 73.5*  PLT 81*  --   --  76* 68* 70*     Basic Metabolic Panel: Recent Labs  Lab 07/24/22 1141 07/24/22 1144 07/24/22 1451 07/25/22 0648 07/26/22 0659 07/27/22 0914  NA 141 141 140 141 141 139  K 3.9 3.9 4.4 4.0 3.4* 3.4*  CL 107 107  --  108 110 111  CO2 24  --   --  22 23  21*  GLUCOSE 103* 102*  --  143* 80 124*  BUN 21 22  --  17 14 8   CREATININE 0.71 0.50  --  0.69 0.67 0.65  CALCIUM 9.6  --   --  9.2 8.8* 8.9  MG  --   --   --  1.9  --   --      GFR: Estimated Creatinine Clearance: 43.7 mL/min (by C-G formula based on SCr of 0.65 mg/dL).  Liver Function Tests: Recent Labs  Lab 07/24/22 1141  AST 40  ALT 39  ALKPHOS 100  BILITOT 0.5  PROT 6.7  ALBUMIN 3.1*  Recent Labs  Lab 07/24/22 1450 07/24/22 1750  AMMONIA 16 25     Coagulation Profile: Recent Labs  Lab 07/24/22 1141  INR 1.0      CBG: Recent Labs  Lab 07/26/22 1540 07/26/22 1921 07/26/22 2323 07/27/22 0334 07/27/22 0807  GLUCAP 98 167* 90 96 98     Thyroid Function Tests: Recent Labs    07/24/22 1450  TSH 3.455  FREET4 0.94  T3FREE 2.3     Anemia Panel: Recent Labs    07/24/22 1450 07/24/22 1750 07/27/22 0914  VITAMINB12 1,793* 1,650*  --   FOLATE 24.4 23.9  --   RETICCTPCT  --   --  0.9     Recent Results (from the past 240 hour(s))  Blood culture (routine x 2)     Status: None (Preliminary result)   Collection Time: 07/24/22  3:34 PM   Specimen: BLOOD LEFT HAND  Result Value Ref Range Status   Specimen Description BLOOD LEFT HAND  Final   Special Requests   Final    BOTTLES DRAWN AEROBIC ONLY Blood Culture results may not be optimal due to an inadequate volume of blood received in culture bottles   Culture   Final    NO GROWTH 3 DAYS Performed at [facility], [address], [place], [place] [address]    Report Status PENDING  Incomplete  Blood culture (routine x 2)     Status: None (Preliminary result)   Collection Time: 07/24/22  3:39 PM   Specimen: BLOOD RIGHT HAND  Result Value Ref Range Status   Specimen Description BLOOD RIGHT HAND  Final   Special Requests   Final    BOTTLES DRAWN AEROBIC AND ANAEROBIC Blood Culture results may not be optimal due to an excessive volume of blood received in culture bottles    Culture   Final    NO GROWTH 3 DAYS Performed at [facility], [address], [place], [place] [address]    Report Status PENDING  Incomplete      Radiology Studies: MR BRAIN WO CONTRAST  Result Date: 07/25/2022 CLINICAL DATA:  Neuro deficit, acute, stroke suspected EXAM: MRI HEAD WITHOUT CONTRAST TECHNIQUE: Multiplanar, multiecho pulse sequences of the brain and surrounding structures were obtained without intravenous contrast. COMPARISON:  CT head July 24, 2022. FINDINGS: Motion limited study. Brain: No acute infarction, hemorrhage, hydrocephalus, extra-axial collection or mass lesion. Extensive confluent T2/FLAIR E per intensities within the white matter, compatible with chronic microvascular ischemic disease. Cerebral atrophy. Vascular: Major arterial flow voids are maintained skull base. Skull and upper cervical spine: Normal marrow signal. Sinuses/Orbits: Clear sinuses.  No acute orbital findings. Other: No mastoid effusions. IMPRESSION: 1. No evidence of acute intracranial abnormality. 2. Severe chronic microvascular ischemic disease and cerebral atrophy (ICD10-G31.9). Electronically Signed   By: Feliberto Harts M.D.   On: 07/25/2022 13:57       LOS: 2 days   Oluwatobiloba Martin Rito Ehrlich  [facility] Pager on [email or web address]  07/27/2022, 10:46 AM

## 2022-07-27 NOTE — Progress Notes (Signed)
Speech Language Pathology Treatment: Dysphagia  Patient Details Name: Tara Dixon MRN: [id number] DOB: 02-May-1931 Today's Date: 07/27/2022 Time: 7829-5621 SLP Time Calculation (min) (ACUTE ONLY): 24 min  Assessment / Plan / Recommendation Clinical Impression  Pt seen for dysphagia management. She was alert and frequently smiled. Right labial drooling observed but patient's head was tilted to the right. She was not aware nor did she swallow on command. SLP facilitated session by brushing patient's teeth using oral suction. Patient was accepting of a few bites of intake, including water, pudding, banana and pancakes. She benefited from use of pure, consistency to orally transit and clear masticated solids. Patient continues with congested breathing and does not cough with demonstration and moderate cues. There was no increase in congestion nor negative changes in vitals with PO intake. Again, patient benefits from handover hand assistance for proprioception input to facilitate swallow safety. Recommend continue diet with supervision. No family present at this time.    HPI HPI: Patient is an 89 year old admitted from current mental status, concerning for potential seizure. Past medical history positive for CVA a normal hypertension, CHF. MRI showed severe chronic microvascular ischemic disease and cerebral atrophy. Chest x-ray was negative for acute finding. And abdomen diagnostic showed retained stool. Swallow evaluation ordered. No family present at this time.      SLP Plan  Continue with current plan of care      Recommendations for follow up therapy are one component of a multi-disciplinary discharge planning process, led by the attending physician.  Recommendations may be updated based on patient status, additional functional criteria and insurance authorization.    Recommendations  Diet recommendations: Dysphagia 3 (mechanical soft);Thin liquid Liquids provided via: Cup;Straw Medication  Administration: Other (Comment) Supervision: Patient able to self feed Compensations: Minimize environmental distractions;Slow rate;Small sips/bites Postural Changes and/or Swallow Maneuvers: Seated upright 90 degrees;Upright 30-60 min after meal                  Oral care BID     Dysphagia, oral phase (R13.11)     Continue with current plan of care    Rolena Infante, MS [facility] SLP Acute Rehab Services Office [phone]  Chales Abrahams  07/27/2022, 12:39 PM

## 2022-07-27 NOTE — Plan of Care (Signed)
  Problem: Education: Goal: Knowledge of General Education information will improve Description: Including pain rating scale, medication(s)/side effects and non-pharmacologic comfort measures 07/27/2022 0422 by Hortencia Pilar, RN Outcome: Progressing 07/27/2022 0422 by Hortencia Pilar, RN Outcome: Progressing   Problem: Health Behavior/Discharge Planning: Goal: Ability to manage health-related needs will improve Outcome: Progressing   Problem: Clinical Measurements: Goal: Ability to maintain clinical measurements within normal limits will improve 07/27/2022 0422 by Hortencia Pilar, RN Outcome: Progressing 07/27/2022 0422 by Hortencia Pilar, RN Outcome: Progressing Goal: Will remain free from infection 07/27/2022 0422 by Hortencia Pilar, RN Outcome: Progressing 07/27/2022 0422 by Hortencia Pilar, RN Outcome: Progressing Goal: Diagnostic test results will improve Outcome: Progressing Goal: Respiratory complications will improve Outcome: Progressing Goal: Cardiovascular complication will be avoided Outcome: Progressing   Problem: Activity: Goal: Risk for activity intolerance will decrease 07/27/2022 0422 by Hortencia Pilar, RN Outcome: Progressing 07/27/2022 0422 by Hortencia Pilar, RN Outcome: Progressing   Problem: Nutrition: Goal: Adequate nutrition will be maintained Outcome: Progressing   Problem: Coping: Goal: Level of anxiety will decrease Outcome: Progressing   Problem: Elimination: Goal: Will not experience complications related to bowel motility 07/27/2022 0422 by Hortencia Pilar, RN Outcome: Progressing 07/27/2022 0422 by Hortencia Pilar, RN Outcome: Progressing Goal: Will not experience complications related to urinary retention Outcome: Progressing   Problem: Pain Managment: Goal: General experience of comfort will improve 07/27/2022 0422 by Hortencia Pilar, RN Outcome: Progressing 07/27/2022 0422 by Hortencia Pilar, RN Outcome: Progressing   Problem:  Safety: Goal: Ability to remain free from injury will improve Outcome: Progressing   Problem: Skin Integrity: Goal: Risk for impaired skin integrity will decrease Outcome: Progressing

## 2022-07-28 DIAGNOSIS — G9340 Encephalopathy, unspecified: Secondary | ICD-10-CM | POA: Diagnosis not present

## 2022-07-28 LAB — GLUCOSE, CAPILLARY
Glucose-Capillary: 93 mg/dL (ref 70–99)
Glucose-Capillary: 93 mg/dL (ref 70–99)
Glucose-Capillary: 103 mg/dL — ABNORMAL HIGH (ref 70–99)

## 2022-07-28 LAB — CULTURE, BLOOD (ROUTINE X 2)

## 2022-07-28 NOTE — Progress Notes (Signed)
Patient left via PTAR. 

## 2022-07-28 NOTE — Discharge Summary (Signed)
[facility]  Physician Discharge Summary   Patient ID: Tara Dixon MRN: [id number] DOB/AGE: Sep 08, 1931 89 y.o.  Admit date: 07/24/2022 Discharge date:   07/28/2022   PCP: Annita Brod, MD  DISCHARGE DIAGNOSES:    Acute metabolic encephalopathy   Hypothermia, resolved   Paroxysmal atrial fibrillation   Thrombocytopenia (HCC)   Dementia with behavioral disturbance (HCC)   HTN (hypertension)   Chronic diastolic CHF (congestive heart failure) (HCC)   Cerebrovascular disease   RECOMMENDATIONS FOR OUTPATIENT FOLLOW UP: Palliative care to continue to follow at home.    Home Health: None Equipment/Devices: Wheelchair  CODE STATUS: DNR  DISCHARGE CONDITION: fair  Diet recommendation: Dysphagia 3 diet with thin liquids  INITIAL HISTORY:  89 y.o. F with dementia, lives at home, HTN, dCHF, and hx CVA who presented with acute episode of rigidity, head held to side, and gurgling.  Seen by neurology.  EEG and MRI brain was recommended.  Patient was hospitalized for further management.   Consultants: Neurology   Procedures: EEG    HOSPITAL COURSE:   Acute metabolic encephalopathy At baseline patient has advanced dementia.  She was initially obtunded and poorly responsive.  Mentation has improved.  Could be close to her baseline. No reversible etiology has been found.  MRI brain was negative.  TSH folate B12 ammonia levels were all normal. EEG did not show any seizures.  Echocardiogram shows normal systolic function. Cefepime could have contributed as well.  Antibiotics were discontinued. She could be experiencing progression of her dementia.   Oropharyngeal dysphagia Seen by speech therapy Started on dysphagia 3 diet. Upper airway congestion is noted.  Chest x-ray did not show any acute findings. Started on scopolamine patch.   Hypothermia/concern for SIRS Unclear etiology.  No infectious etiology noted.  Cultures negative so far.  Was empirically started on  broad-spectrum antibiotics which was discontinued yesterday.  Remains afebrile.   Paroxysmal atrial fibrillation This is new.  Was transiently noted on telemetry.  Currently in sinus rhythm. Considering her thrombocytopenia and advanced dementia, she is not a good candidate for anticoagulation.   TSH was normal.  Echocardiogram shows normal systolic function of the left ventricle with grade 1 diastolic dysfunction.  No significant valvular abnormalities noted.   Thrombocytopenia Platelet counts are low but stable.  No evidence of bleeding.   Hypokalemia  Monitor.   Dementia with behavioral disturbances Noted to be on Seroquel and haloperidol prior to admission.  Seems to be close to baseline now.   Diastolic CHF Stable.   History of stroke. No acute stroke noted on MRI.   Essential hypertension Not noted to be on any antihypertensives prior to admission. Occasional high readings noted.  Will not be too aggressive in treating the BP considering her advanced age and dementia.   Microcytic anemia Hemoglobin is low but stable.  No evidence of overt blood loss.  Vitamin B12 and folic acid levels were not deficient.  Ferritin and iron levels noted to be normal as well.  This all could be anemia of chronic disease.  Patient is stable.  Should be able to go home today for daughter is able to take her.  She will need transportation.  Medically stable for discharge.   PERTINENT LABS:  The results of significant diagnostics from this hospitalization (including imaging, microbiology, ancillary and laboratory) are listed below for reference.    Microbiology: Recent Results (from the past 240 hour(s))  Blood culture (routine x 2)     Status: None (Preliminary result)  Collection Time: 07/24/22  3:34 PM   Specimen: BLOOD LEFT HAND  Result Value Ref Range Status   Specimen Description BLOOD LEFT HAND  Final   Special Requests   Final    BOTTLES DRAWN AEROBIC ONLY Blood Culture results  may not be optimal due to an inadequate volume of blood received in culture bottles   Culture   Final    NO GROWTH 3 DAYS Performed at [facility], [address], [place], [place] [address]    Report Status PENDING  Incomplete  Blood culture (routine x 2)     Status: None (Preliminary result)   Collection Time: 07/24/22  3:39 PM   Specimen: BLOOD RIGHT HAND  Result Value Ref Range Status   Specimen Description BLOOD RIGHT HAND  Final   Special Requests   Final    BOTTLES DRAWN AEROBIC AND ANAEROBIC Blood Culture results may not be optimal due to an excessive volume of blood received in culture bottles   Culture   Final    NO GROWTH 3 DAYS Performed at [facility], [address], [place], [place] [address]    Report Status PENDING  Incomplete     Labs:   Basic Metabolic Panel: Recent Labs  Lab 07/24/22 1141 07/24/22 1144 07/24/22 1451 07/25/22 0648 07/26/22 0659 07/27/22 0914  NA 141 141 140 141 141 139  K 3.9 3.9 4.4 4.0 3.4* 3.4*  CL 107 107  --  108 110 111  CO2 24  --   --  22 23 21*  GLUCOSE 103* 102*  --  143* 80 124*  BUN 21 22  --  17 14 8   CREATININE 0.71 0.50  --  0.69 0.67 0.65  CALCIUM 9.6  --   --  9.2 8.8* 8.9  MG  --   --   --  1.9  --   --    Liver Function Tests: Recent Labs  Lab 07/24/22 1141  AST 40  ALT 39  ALKPHOS 100  BILITOT 0.5  PROT 6.7  ALBUMIN 3.1*    Recent Labs  Lab 07/24/22 1450 07/24/22 1750  AMMONIA 16 25   CBC: Recent Labs  Lab 07/24/22 1141 07/24/22 1144 07/24/22 1451 07/25/22 0648 07/26/22 0659 07/27/22 0914  WBC 3.8*  --   --  5.6 4.2 3.2*  NEUTROABS 1.1*  --   --   --   --   --   HGB 10.4* 11.9* 11.2* 9.7* 9.0* 9.2*  HCT 32.9* 35.0* 33.0* 28.4* 26.8* 27.7*  MCV 76.2*  --   --  74.9* 74.9* 73.5*  PLT 81*  --   --  76* 68* 70*     CBG: Recent Labs  Lab 07/27/22 1600 07/27/22 2019 07/27/22 2332 07/28/22 0402 07/28/22 0738  GLUCAP 182* 103* 81 93 93     IMAGING STUDIES MR BRAIN  WO CONTRAST  Result Date: 07/25/2022 CLINICAL DATA:  Neuro deficit, acute, stroke suspected EXAM: MRI HEAD WITHOUT CONTRAST TECHNIQUE: Multiplanar, multiecho pulse sequences of the brain and surrounding structures were obtained without intravenous contrast. COMPARISON:  CT head July 24, 2022. FINDINGS: Motion limited study. Brain: No acute infarction, hemorrhage, hydrocephalus, extra-axial collection or mass lesion. Extensive confluent T2/FLAIR E per intensities within the white matter, compatible with chronic microvascular ischemic disease. Cerebral atrophy. Vascular: Major arterial flow voids are maintained skull base. Skull and upper cervical spine: Normal marrow signal. Sinuses/Orbits: Clear sinuses.  No acute orbital findings. Other: No mastoid effusions.  IMPRESSION: 1. No evidence of acute intracranial abnormality. 2. Severe chronic microvascular ischemic disease and cerebral atrophy (ICD10-G31.9). Electronically Signed   By: Feliberto Harts M.D.   On: 07/25/2022 13:57   ECHOCARDIOGRAM COMPLETE  Result Date: 07/25/2022    ECHOCARDIOGRAM REPORT   Patient Name:   MARVETTE FIGUERO Date of Exam: 07/25/2022 Medical Rec #:  [id number]     Height:       61.5 in Accession #:    [id number]    Weight:       151.7 lb Date of Birth:  10-Aug-1931     BSA:          1.690 m Patient Age:    89 years      BP:           117/70 mmHg Patient Gender: F             HR:           71 bpm. Exam Location:  Inpatient Procedure: 2D Echo, Cardiac Doppler and Color Doppler Indications:    Atrial Fibrillation I48.91  History:        Patient has no prior history of Echocardiogram examinations.                 Arrythmias:Atrial Fibrillation, Signs/Symptoms:Altered Mental                 Status; Risk Factors:Hypertension and Non-Smoker.  Sonographer:    Aron Baba Referring Phys: [id number] RONDELL A SMITH  Sonographer Comments: Image acquisition challenging due to uncooperative patient. IMPRESSIONS  1. Left ventricular ejection fraction, by  estimation, is 60 to 65%. The left ventricle has normal function. Left ventricular endocardial border not optimally defined to evaluate regional wall motion. Left ventricular diastolic parameters are consistent with Grade I diastolic dysfunction (impaired relaxation).  2. Right ventricular systolic function is normal. The right ventricular size is normal.  3. Left atrial size was mildly dilated.  4. The mitral valve is normal in structure. No evidence of mitral valve regurgitation. No evidence of mitral stenosis.  5. The aortic valve was not well visualized. Aortic valve regurgitation is not visualized. No aortic stenosis is present.  6. Technically limited study due to poor sound wave transmission. FINDINGS  Left Ventricle: Left ventricular ejection fraction, by estimation, is 60 to 65%. The left ventricle has normal function. Left ventricular endocardial border not optimally defined to evaluate regional wall motion. The left ventricular internal cavity size was normal in size. There is no left ventricular hypertrophy. Left ventricular diastolic parameters are consistent with Grade I diastolic dysfunction (impaired relaxation). Right Ventricle: The right ventricular size is normal. No increase in right ventricular wall thickness. Right ventricular systolic function is normal. Left Atrium: Left atrial size was mildly dilated. Right Atrium: Right atrial size was normal in size. Pericardium: There is no evidence of pericardial effusion. Mitral Valve: The mitral valve is normal in structure. No evidence of mitral valve regurgitation. No evidence of mitral valve stenosis. Tricuspid Valve: The tricuspid valve is normal in structure. Tricuspid valve regurgitation is trivial. No evidence of tricuspid stenosis. Aortic Valve: The aortic valve was not well visualized. Aortic valve regurgitation is not visualized. No aortic stenosis is present. Pulmonic Valve: The pulmonic valve was not well visualized. Pulmonic valve  regurgitation is not visualized. No evidence of pulmonic stenosis. Aorta: The aortic root is normal in size and structure. Venous: The inferior vena cava was not well visualized. IAS/Shunts: No atrial level shunt detected by color  flow Doppler.  LEFT VENTRICLE PLAX 2D LVIDd:         3.80 cm   Diastology LVIDs:         2.40 cm   LV e' medial:    4.05 cm/s LV PW:         0.60 cm   LV E/e' medial:  10.3 LV IVS:        0.90 cm   LV e' lateral:   8.10 cm/s LVOT diam:     1.50 cm   LV E/e' lateral: 5.1 LV SV:         25 LV SV Index:   15 LVOT Area:     1.77 cm  RIGHT VENTRICLE RV S prime:     12.80 cm/s TAPSE (M-mode): 1.6 cm LEFT ATRIUM           Index        RIGHT ATRIUM           Index LA diam:      2.60 cm 1.54 cm/m   RA Area:     11.00 cm LA Vol (A4C): 21.6 ml 12.78 ml/m  RA Volume:   25.20 ml  14.92 ml/m  AORTIC VALVE LVOT Vmax:   71.30 cm/s LVOT Vmean:  43.500 cm/s LVOT VTI:    0.142 m  AORTA Ao Root diam: 3.30 cm MITRAL VALVE MV Area (PHT): 3.31 cm    SHUNTS MV Decel Time: 229 msec    Systemic VTI:  0.14 m MV E velocity: 41.60 cm/s  Systemic Diam: 1.50 cm MV A velocity: 73.80 cm/s MV E/A ratio:  0.56 Arvilla Meres MD Electronically signed by Arvilla Meres MD Signature Date/Time: 07/25/2022/10:13:30 AM    Final    Overnight EEG with video  Result Date: 07/25/2022 Charlsie Quest, MD     07/26/2022 10:27 AM Patient Name: Debbie Bartnick MRN: [id number] Epilepsy Attending: Charlsie Quest Referring Physician/Provider: Erick Blinks, MD Duration: 07/24/2022 1336 to 07/25/2022 1148 Patient history: 89 y.o. female with PMH significant for advanced alzheimers with failure to thrive, anemia, thrombocytopenia, HTN who was at her baseline at 5 AM this morning and then essentially unresponsive with her eyes open and bubbling in her mouth.  EMS noted right gaze deviation and aphasia and she was brought in as a code stroke. EEG to evaluate for seizure. Level of alertness: lethargic AEDs during EEG study: None  Technical aspects: This EEG study was done with scalp electrodes positioned according to the 10-20 International system of electrode placement. Electrical activity was reviewed with band pass filter of 1-70Hz , sensitivity of 7 uV/mm, display speed of 13mm/sec with a 60Hz  notched filter applied as appropriate. EEG data were recorded continuously and digitally stored.  Video monitoring was available and reviewed as appropriate. Description: At the beginning of the study, EEG showed low amplitude 3 to 5 Hz theta-delta slowing.  Gradually after around 0515 on 07/25/2022, EEG showed intermittent 3 to 6 Hz theta- delta slowing lasting 3 to 5 seconds alternating with 1 to 3 seconds of generalized background attenuation.  When patient was awake/stimulated, triphasic waves were also noted.  Hyperventilation and photic stimulation were not performed.   ABNORMALITY - Continuous slow, generalized -Triphasic waves, generalized IMPRESSION: This study was initially suggestive of severe diffuse encephalopathy, nonspecific etiology.  After around 0515 on 07/25/2022, EEG appeared to worsen and was suggestive of profound diffuse encephalopathy, nonspecific etiology but could be secondary to toxic-metabolic causes. No seizures or definite epileptiform discharges were seen throughout  the recording. Charlsie Quest   DG Abd Portable 1V  Result Date: 07/24/2022 CLINICAL DATA:  Constipation EXAM: PORTABLE ABDOMEN - 1 VIEW COMPARISON:  X-ray 05/21/2021 FINDINGS: Limited x-ray. Portable supine and severely rotated to the right. There is presumed contrast in the urinary bladder and renal collecting systems proximally. Please correlate with previous CT angiogram of the head and neck from earlier 07/24/2022. Scattered colonic stool identified. Gas is seen in nondilated loops of small and large bowel. The right hemi abdominal edge is clipped off the edge of the film. Prominent degenerative changes of the spine. IMPRESSION: Very limited x-ray.  Nonspecific bowel gas pattern with scattered colonic stool. Electronically Signed   By: Karen Kays M.D.   On: 07/24/2022 17:47   DG Chest Portable 1 View  Result Date: 07/24/2022 CLINICAL DATA:  Altered mental status EXAM: PORTABLE CHEST 1 VIEW COMPARISON:  Chest radiograph dated 06/09/2021 FINDINGS: Low lung volumes. No focal consolidations. No pleural effusion or pneumothorax. Similar enlarged cardiomediastinal silhouette. No acute osseous abnormality. IMPRESSION: Low lung volumes without focal consolidation. Electronically Signed   By: Agustin Cree M.D.   On: 07/24/2022 14:50   CT ANGIO HEAD NECK W WO CM W PERF (CODE STROKE)  Result Date: 07/24/2022 CLINICAL DATA:  Neuro deficit, acute, stroke suspected EXAM: EXAM CT ANGIOGRAPHY HEAD AND NECK WITH AND WITHOUT CONTRAST TECHNIQUE: TECHNIQUE Multidetector CT imaging of the head and neck was performed using the standard protocol during bolus administration of intravenous contrast. Multiplanar CT image reconstructions and MIPs were obtained to evaluate the vascular anatomy. Carotid stenosis measurements (when applicable) are obtained utilizing NASCET criteria, using the distal internal carotid diameter as the denominator. RADIATION DOSE REDUCTION: This exam was performed according to the departmental dose-optimization program which includes automated exposure control, adjustment of the mA and/or kV according to patient size and/or use of iterative reconstruction technique. COMPARISON:  CT head from the same day. FINDINGS: CTA NECK: Aorta: Great vessel origins are patent without significant stenosis. Right carotid system: Patent without significant (greater than 50%) stenosis. Left carotid system: Patent without significant (greater than 50%) stenosis. Vertebral arteries: Right dominant. Both vertebral arteries are patent in the neck without greater than 50% stenosis. Neck: No acute abnormality on limited assessment. Osseous: No acute abnormality on limited  assessment. Severe multilevel degenerative change including bulky osteophytes and multilevel ankylosis. CTA HEAD: Anterior circulation: Bilateral intracranial ICAs, MCAs, and ACAs are patent without proximal hemodynamically significant stenosis. Posterior circulation: Severe stenosis versus occlusion of the small/non dominant intradural vertebral artery at its dural margin. Reconstitution distally of a small intradural vertebral artery. Right intradural vertebral artery is patent. Severe stenosis of the mid basilar artery. Prominent bilateral posterior communicating arteries with small distal vertebrobasilar system, anatomic variant. Both posterior cerebral arteries are patent without proximal hemodynamically significant stenosis. IMPRESSION: 1. No emergent large vessel occlusion. 2. Severe stenosis versus occlusion of a diminutive nondominant left intradural vertebral artery is dural margin with reconstitution distally. 3. Severe stenosis of the basilar artery. Electronically Signed   By: Feliberto Harts M.D.   On: 07/24/2022 12:30   CT HEAD CODE STROKE WO CONTRAST  Result Date: 07/24/2022 CLINICAL DATA:  Code stroke. Neuro deficit, acute, stroke suspected no lateralizing signs provided. EXAM: CT HEAD WITHOUT CONTRAST TECHNIQUE: Contiguous axial images were obtained from the base of the skull through the vertex without intravenous contrast. RADIATION DOSE REDUCTION: This exam was performed according to the departmental dose-optimization program which includes automated exposure control, adjustment of the mA and/or  kV according to patient size and/or use of iterative reconstruction technique. COMPARISON:  MR Brain 06/09/21 FINDINGS: Brain: No evidence of acute infarction, hemorrhage, hydrocephalus, extra-axial collection or mass lesion/mass effect. Sequela of severe chronic microvascular ischemic change. Generalized volume loss. Vascular: No hyperdense vessel or unexpected calcification. Skull: Mild soft tissue  thickening along the right parietal scalp Sinuses/Orbits: No middle ear or mastoid effusion. Paranasal sinuses are clear. Orbits are unremarkable. Other: None. ASPECTS [facility] Stroke Program Early CT Score): 10 when accounting for chronic findings IMPRESSION: No hemorrhage or CT evidence of an acute cortical infarcts. Aspects is 10 when accounting for chronic findings. Findings were paged to Dr. Derry Lory on 07/24/2022 at 11:55 a.m. via [facility] paging system. Electronically Signed   By: Lorenza Cambridge M.D.   On: 07/24/2022 11:56    DISCHARGE EXAMINATION: Vitals:   07/27/22 2018 07/27/22 2326 07/28/22 0359 07/28/22 0737  BP: (!) 175/108 (!) 147/81 (!) 146/104 131/82  Pulse: 77 79 83 67  Resp: 19 16 18 18   Temp: 98.9 F (37.2 C) 99.8 F (37.7 C) 98.9 F (37.2 C) 98.6 F (37 C)  TempSrc: Oral Oral Oral Oral  SpO2: 96% 97% 97% 98%  Weight:      Height:       General appearance: Somnolent Resp: Clear to auscultation bilaterally.  Normal effort Cardio: S1-S2 is normal regular.  No S3-S4.  No rubs murmurs or bruit GI: Abdomen is soft.  Nontender nondistended.  Bowel sounds are present normal.  No masses organomegaly    DISPOSITION: Home with daughter  Discharge Instructions     Call MD for:  difficulty breathing, headache or visual disturbances   Complete by: As directed    Call MD for:  extreme fatigue   Complete by: As directed    Call MD for:  persistant dizziness or light-headedness   Complete by: As directed    Call MD for:  persistant nausea and vomiting   Complete by: As directed    Call MD for:  severe uncontrolled pain   Complete by: As directed    Call MD for:  temperature >100.4   Complete by: As directed    Discharge instructions   Complete by: As directed    You were cared for by a hospitalist during your hospital stay. If you have any questions about your discharge medications or the care you received while you were in the hospital after you are discharged, you can  call the unit and asked to speak with the hospitalist on call if the hospitalist that took care of you is not available. Once you are discharged, your primary care physician will handle any further medical issues. Please note that NO REFILLS for any discharge medications will be authorized once you are discharged, as it is imperative that you return to your primary care physician (or establish a relationship with a primary care physician if you do not have one) for your aftercare needs so that they can reassess your need for medications and monitor your lab values. If you do not have a primary care physician, you can call [phone] for a physician referral.   Increase activity slowly   Complete by: As directed           Allergies as of 07/28/2022       Reactions   Ampicillin Other (See Comments)   unknown   Bactrim [sulfamethoxazole-trimethoprim] Other (See Comments)   unknown   Celebrex [celecoxib] Other (See Comments)   unknown   Donepezil  Other reaction(s): intolerant   Penicillins Other (See Comments)   unknown   Polysporin [bacitracin-polymyxin B] Other (See Comments)   unknown        Medication List     TAKE these medications    acetaminophen 160 MG/5ML liquid Commonly known as: TYLENOL Take 320 mg by mouth every 4 (four) hours as needed for fever.   diclofenac Sodium 1 % Gel Commonly known as: VOLTAREN Apply 2 g topically daily as needed (pain).   haloperidol 0.5 MG tablet Commonly known as: HALDOL Take 0.5 mg by mouth daily.   nystatin cream Commonly known as: MYCOSTATIN Apply 1 Application topically daily.   QUEtiapine 25 MG tablet Commonly known as: SEROQUEL Take 12.5 mg by mouth at bedtime.   scopolamine 1 MG/3DAYS Commonly known as: TRANSDERM-SCOP Place 1 patch onto the skin every 3 (three) days.   sorbitol 70 % solution Take 15 mLs by mouth daily as needed (As needed).               Durable Medical Equipment  (From admission, onward)            Start     Ordered   07/25/22 1236  For home use only DME standard manual wheelchair with seat cushion  Once       Comments: Patient suffers from weakness which impairs their ability to perform daily activities like bathing, dressing, grooming, and toileting in the home.  A walker will not resolve issue with performing activities of daily living. A wheelchair will allow patient to safely perform daily activities. Patient can safely propel the wheelchair in the home or has a caregiver who can provide assistance. Length of need Lifetime. Accessories: elevating leg rests (ELRs), wheel locks, extensions and anti-tippers. Reclining wheelchair--18 inch   07/25/22 1236              Follow-up Information     Annita Brod, MD. Schedule an appointment as soon as possible for a visit in 1 week(s).   Specialty: Internal Medicine Contact information: [address] [place] [address] [phone]                 TOTAL DISCHARGE TIME: 35 minutes  Scherry Laverne Rito Ehrlich  [facility] Pager on [email or web address]  07/28/2022, 10:07 AM

## 2022-07-28 NOTE — Care Management Important Message (Signed)
Important Message  Patient Details  Name: Tara Dixon MRN: [id number] Date of Birth: 12-30-1931   Medicare Important Message Given:  Yes     Sherah Lund Stefan Church 07/28/2022, 3:48 PM

## 2022-07-28 NOTE — TOC Transition Note (Signed)
Transition of Care [facility]) - CM/SW Discharge Note   Patient Details  Name: Tara Dixon MRN: [id number] Date of Birth: [birth year]  [facility] CM/SW Contact:  Ronny Bacon, RN Phone Number: 07/28/2022, 1:09 PM   Clinical Narrative:   Spoke with daughter at bedside. Notified that [facility] will be called to transport patient home. Daughter in agreement, address verified. Jermaine, [employer], confirmed wheelchair will be delivered to the home.     Final next level of care: Home/Self Care Barriers to Discharge: No Barriers Identified   Patient Goals and CMS Choice      Discharge Placement                         Discharge Plan and Services Additional resources added to the After Visit Summary for     Discharge Planning Services: CM Consult            DME Arranged: Wheelchair manual (Tilt) DME Agency: [employer] Date DME Agency Contacted: 07/27/22   Representative spoke with at DME Agency: Vaughan Basta            Social Determinants of Health (SDOH) Interventions SDOH Screenings   Food Insecurity: No Food Insecurity (07/25/2022)  Housing: Low Risk  (07/25/2022)  Transportation Needs: No Transportation Needs (07/25/2022)  Utilities: Not At Risk (07/25/2022)  Tobacco Use: Low Risk  (07/24/2022)     Readmission Risk Interventions     No data to display

## 2022-07-29 LAB — CULTURE, BLOOD (ROUTINE X 2)

## 2022-11-21 DEATH — deceased

## 2023-03-17 IMAGING — DX DG ABDOMEN 1V
1 series · 2 of 2 positions shown · non-contrast
Comparison: CT abdomen and pelvis 05/27/2012

CLINICAL DATA: Diarrhea.

EXAM:
ABDOMEN - 1 VIEW

[Series 1: abdomen kub · 0.14mm/px · 2 of 2 slices shown]
[im 1/2]
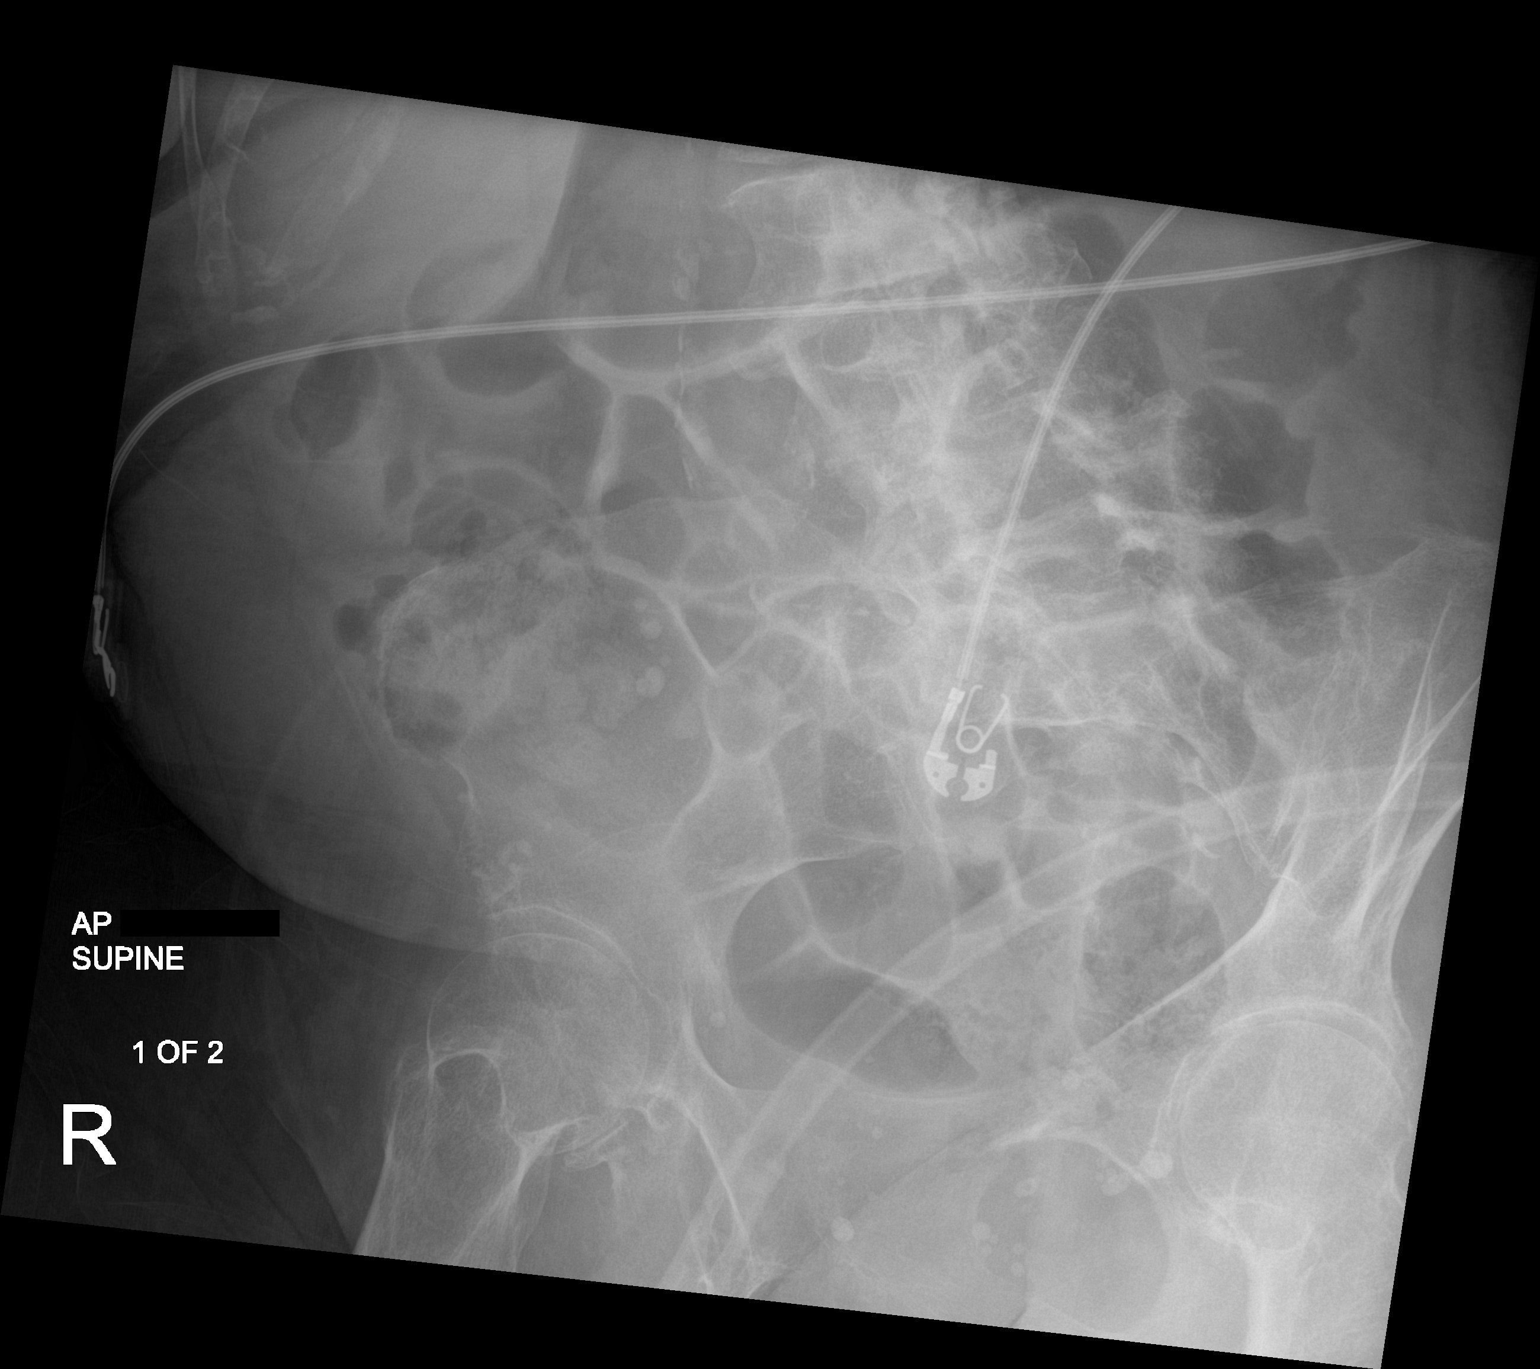
[im 2/2]
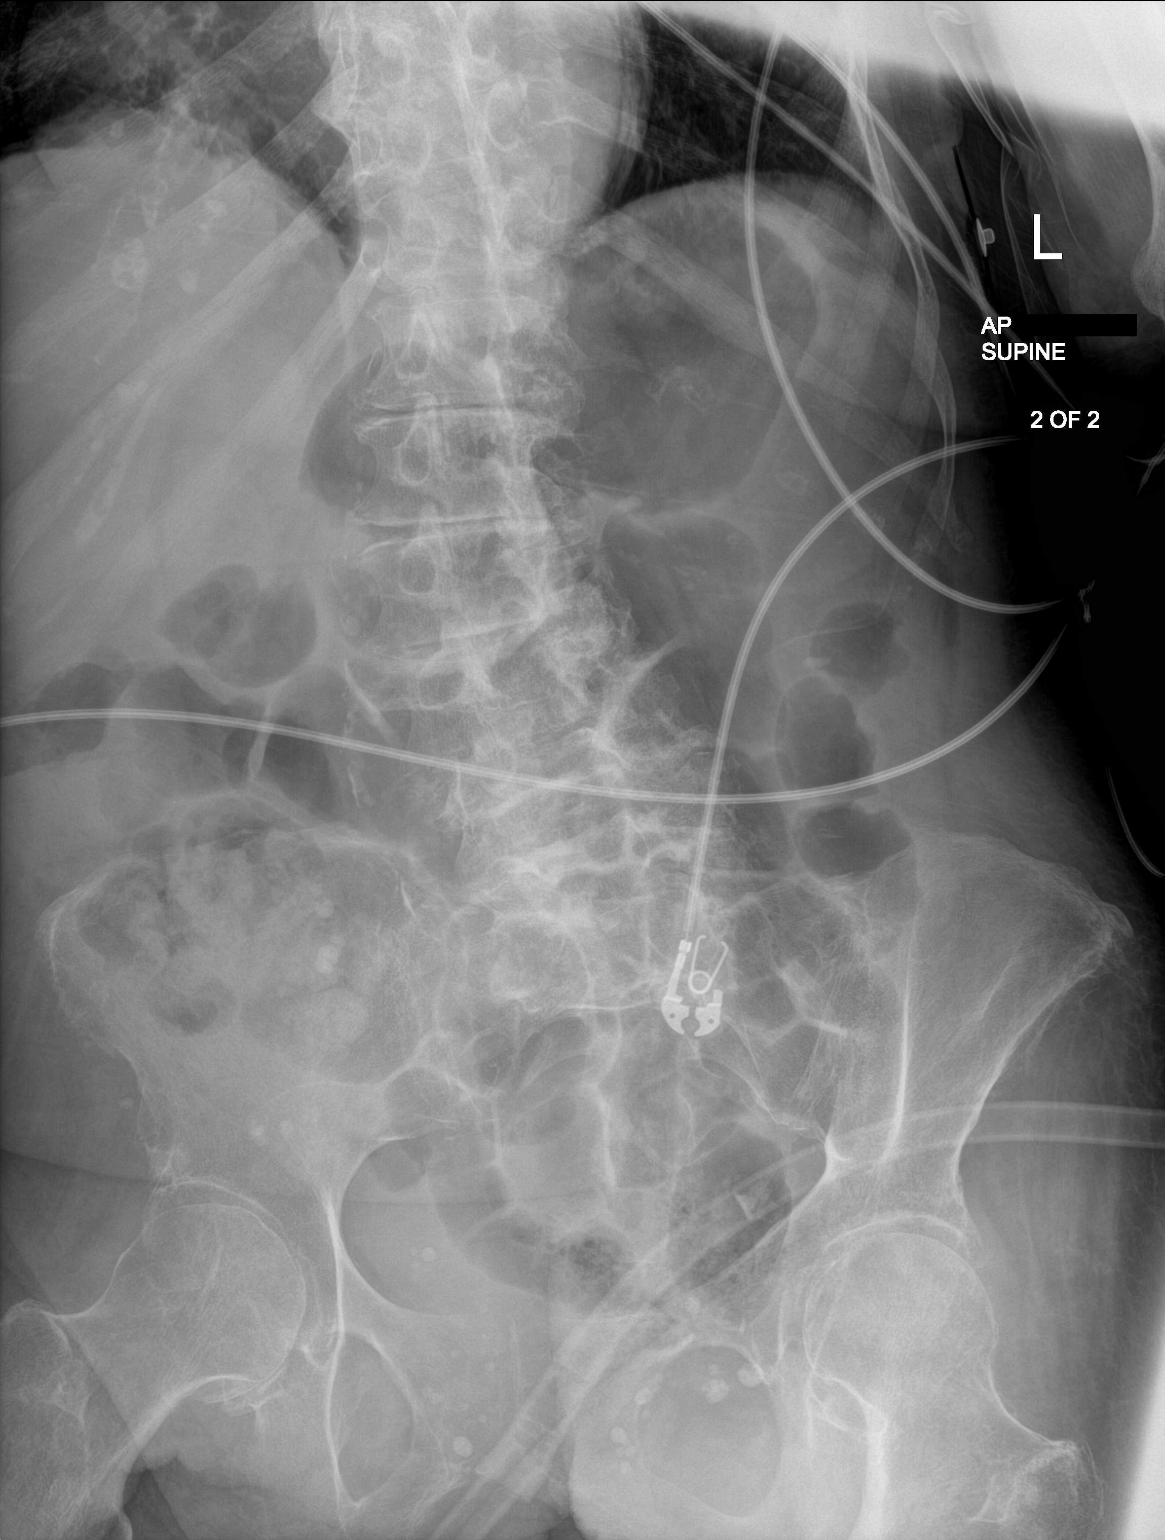

[2 of 2 positions shown; findings below may reference images not displayed]

FINDINGS: The bowel gas pattern is normal. There is gaseous distention of
large and small bowel to the level of the rectum. There is mild
stool burden. Vascular calcifications are seen in the pelvis. There
also atherosclerotic calcifications of the aorta. Degenerative
changes affect the spine and hips.
IMPRESSION: Nonobstructive, nonspecific bowel gas pattern.
